# Patient Record
Sex: Female | Born: 1985 | Race: Black or African American | Hispanic: No | Marital: Single | Smoking: Never smoker
Health system: Southern US, Community
[De-identification: ages and names within clinical notes are randomized; demographics above are authoritative.]

## PROBLEM LIST (undated history)

## (undated) ENCOUNTER — Inpatient Hospital Stay (HOSPITAL_COMMUNITY): Payer: Self-pay

## (undated) DIAGNOSIS — Z202 Contact with and (suspected) exposure to infections with a predominantly sexual mode of transmission: Secondary | ICD-10-CM

## (undated) DIAGNOSIS — J45909 Unspecified asthma, uncomplicated: Secondary | ICD-10-CM

## (undated) DIAGNOSIS — R51 Headache: Secondary | ICD-10-CM

## (undated) DIAGNOSIS — A549 Gonococcal infection, unspecified: Secondary | ICD-10-CM

## (undated) DIAGNOSIS — I10 Essential (primary) hypertension: Secondary | ICD-10-CM

## (undated) HISTORY — PX: DILATION AND CURETTAGE OF UTERUS: SHX78

## (undated) HISTORY — PX: INDUCED ABORTION: SHX677

## (undated) HISTORY — PX: EYE SURGERY: SHX253

---
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
## 2005-02-21 ENCOUNTER — Emergency Department (HOSPITAL_COMMUNITY): Admission: EM | Admit: 2005-02-21 | Discharge: 2005-02-21 | Payer: Self-pay | Admitting: Emergency Medicine

## 2005-10-31 ENCOUNTER — Emergency Department (HOSPITAL_COMMUNITY): Admission: EM | Admit: 2005-10-31 | Discharge: 2005-10-31 | Payer: Self-pay | Admitting: Emergency Medicine

## 2005-12-14 ENCOUNTER — Inpatient Hospital Stay (HOSPITAL_COMMUNITY): Admission: AD | Admit: 2005-12-14 | Discharge: 2005-12-14 | Payer: Self-pay | Admitting: Family Medicine

## 2006-07-12 ENCOUNTER — Inpatient Hospital Stay (HOSPITAL_COMMUNITY): Admission: AD | Admit: 2006-07-12 | Discharge: 2006-07-12 | Payer: Self-pay | Admitting: Gynecology

## 2006-10-20 ENCOUNTER — Emergency Department (HOSPITAL_COMMUNITY): Admission: EM | Admit: 2006-10-20 | Discharge: 2006-10-21 | Payer: Self-pay | Admitting: Emergency Medicine

## 2007-02-28 ENCOUNTER — Inpatient Hospital Stay (HOSPITAL_COMMUNITY): Admission: AD | Admit: 2007-02-28 | Discharge: 2007-02-28 | Payer: Self-pay | Admitting: Obstetrics & Gynecology

## 2007-04-10 ENCOUNTER — Ambulatory Visit (HOSPITAL_COMMUNITY): Admission: RE | Admit: 2007-04-10 | Discharge: 2007-04-10 | Payer: Self-pay | Admitting: Obstetrics & Gynecology

## 2007-05-30 ENCOUNTER — Inpatient Hospital Stay (HOSPITAL_COMMUNITY): Admission: AD | Admit: 2007-05-30 | Discharge: 2007-05-30 | Payer: Self-pay | Admitting: Obstetrics & Gynecology

## 2007-06-05 ENCOUNTER — Ambulatory Visit (HOSPITAL_COMMUNITY): Admission: RE | Admit: 2007-06-05 | Discharge: 2007-06-05 | Payer: Self-pay | Admitting: Obstetrics

## 2007-06-26 ENCOUNTER — Inpatient Hospital Stay (HOSPITAL_COMMUNITY): Admission: AD | Admit: 2007-06-26 | Discharge: 2007-06-26 | Payer: Self-pay | Admitting: Obstetrics & Gynecology

## 2007-07-14 ENCOUNTER — Inpatient Hospital Stay (HOSPITAL_COMMUNITY): Admission: AD | Admit: 2007-07-14 | Discharge: 2007-07-14 | Payer: Self-pay | Admitting: Obstetrics

## 2007-10-13 ENCOUNTER — Inpatient Hospital Stay (HOSPITAL_COMMUNITY): Admission: AD | Admit: 2007-10-13 | Discharge: 2007-10-13 | Payer: Self-pay | Admitting: Obstetrics

## 2007-10-25 ENCOUNTER — Inpatient Hospital Stay (HOSPITAL_COMMUNITY): Admission: AD | Admit: 2007-10-25 | Discharge: 2007-10-27 | Payer: Self-pay | Admitting: Obstetrics

## 2008-03-22 ENCOUNTER — Emergency Department (HOSPITAL_COMMUNITY): Admission: EM | Admit: 2008-03-22 | Discharge: 2008-03-23 | Payer: Self-pay | Admitting: Emergency Medicine

## 2008-10-19 ENCOUNTER — Emergency Department (HOSPITAL_COMMUNITY): Admission: EM | Admit: 2008-10-19 | Discharge: 2008-10-19 | Payer: Self-pay | Admitting: Emergency Medicine

## 2009-01-08 ENCOUNTER — Emergency Department (HOSPITAL_COMMUNITY): Admission: EM | Admit: 2009-01-08 | Discharge: 2009-01-08 | Payer: Self-pay | Admitting: Emergency Medicine

## 2009-01-13 ENCOUNTER — Inpatient Hospital Stay (HOSPITAL_COMMUNITY): Admission: AD | Admit: 2009-01-13 | Discharge: 2009-01-13 | Payer: Self-pay | Admitting: Obstetrics & Gynecology

## 2009-01-13 ENCOUNTER — Ambulatory Visit: Payer: Self-pay | Admitting: Advanced Practice Midwife

## 2009-01-16 ENCOUNTER — Inpatient Hospital Stay (HOSPITAL_COMMUNITY): Admission: AD | Admit: 2009-01-16 | Discharge: 2009-01-17 | Payer: Self-pay | Admitting: Obstetrics & Gynecology

## 2009-04-02 ENCOUNTER — Inpatient Hospital Stay (HOSPITAL_COMMUNITY): Admission: AD | Admit: 2009-04-02 | Discharge: 2009-04-02 | Payer: Self-pay | Admitting: Obstetrics

## 2009-04-08 ENCOUNTER — Emergency Department (HOSPITAL_COMMUNITY): Admission: EM | Admit: 2009-04-08 | Discharge: 2009-04-09 | Payer: Self-pay | Admitting: Emergency Medicine

## 2009-04-21 ENCOUNTER — Ambulatory Visit (HOSPITAL_COMMUNITY): Admission: RE | Admit: 2009-04-21 | Discharge: 2009-04-21 | Payer: Self-pay | Admitting: Obstetrics

## 2009-05-20 ENCOUNTER — Inpatient Hospital Stay (HOSPITAL_COMMUNITY): Admission: AD | Admit: 2009-05-20 | Discharge: 2009-05-20 | Payer: Self-pay | Admitting: Obstetrics & Gynecology

## 2009-07-12 ENCOUNTER — Inpatient Hospital Stay (HOSPITAL_COMMUNITY): Admission: AD | Admit: 2009-07-12 | Discharge: 2009-07-12 | Payer: Self-pay | Admitting: Obstetrics

## 2009-08-13 ENCOUNTER — Ambulatory Visit: Payer: Self-pay | Admitting: Physician Assistant

## 2009-08-13 ENCOUNTER — Inpatient Hospital Stay (HOSPITAL_COMMUNITY): Admission: AD | Admit: 2009-08-13 | Discharge: 2009-08-14 | Payer: Self-pay | Admitting: Obstetrics & Gynecology

## 2009-08-30 ENCOUNTER — Inpatient Hospital Stay (HOSPITAL_COMMUNITY): Admission: AD | Admit: 2009-08-30 | Discharge: 2009-08-31 | Payer: Self-pay | Admitting: Obstetrics & Gynecology

## 2009-08-31 ENCOUNTER — Inpatient Hospital Stay (HOSPITAL_COMMUNITY): Admission: AD | Admit: 2009-08-31 | Discharge: 2009-08-31 | Payer: Self-pay | Admitting: Obstetrics

## 2009-09-05 ENCOUNTER — Inpatient Hospital Stay (HOSPITAL_COMMUNITY): Admission: AD | Admit: 2009-09-05 | Discharge: 2009-09-07 | Payer: Self-pay | Admitting: Obstetrics & Gynecology

## 2010-12-21 ENCOUNTER — Emergency Department (HOSPITAL_COMMUNITY)
Admission: EM | Admit: 2010-12-21 | Discharge: 2010-12-21 | Disposition: A | Discharge status: Home / Self Care | Payer: No Typology Code available for payment source | Attending: Emergency Medicine | Admitting: Emergency Medicine

## 2010-12-21 ENCOUNTER — Emergency Department (HOSPITAL_COMMUNITY): Payer: No Typology Code available for payment source

## 2010-12-21 DIAGNOSIS — M545 Low back pain, unspecified: Secondary | ICD-10-CM | POA: Insufficient documentation

## 2010-12-21 DIAGNOSIS — Y929 Unspecified place or not applicable: Secondary | ICD-10-CM | POA: Insufficient documentation

## 2010-12-21 DIAGNOSIS — S139XXA Sprain of joints and ligaments of unspecified parts of neck, initial encounter: Secondary | ICD-10-CM | POA: Insufficient documentation

## 2010-12-21 DIAGNOSIS — M542 Cervicalgia: Secondary | ICD-10-CM | POA: Insufficient documentation

## 2010-12-21 DIAGNOSIS — R51 Headache: Secondary | ICD-10-CM | POA: Insufficient documentation

## 2010-12-21 DIAGNOSIS — M546 Pain in thoracic spine: Secondary | ICD-10-CM | POA: Insufficient documentation

## 2010-12-21 DIAGNOSIS — T148XXA Other injury of unspecified body region, initial encounter: Secondary | ICD-10-CM | POA: Insufficient documentation

## 2010-12-27 LAB — CBC
HCT: 34.8 % — ABNORMAL LOW (ref 36.0–46.0)
MCHC: 33.1 g/dL (ref 30.0–36.0)
Platelets: 238 10*3/uL (ref 150–400)
Platelets: 270 10*3/uL (ref 150–400)
RBC: 3.81 MIL/uL — ABNORMAL LOW (ref 3.87–5.11)
RDW: 16.9 % — ABNORMAL HIGH (ref 11.5–15.5)

## 2010-12-29 LAB — URINE MICROSCOPIC-ADD ON

## 2010-12-29 LAB — URINALYSIS, ROUTINE W REFLEX MICROSCOPIC
Glucose, UA: NEGATIVE mg/dL
Nitrite: NEGATIVE
Protein, ur: NEGATIVE mg/dL
Urobilinogen, UA: 0.2 mg/dL (ref 0.0–1.0)

## 2010-12-31 LAB — URINALYSIS, ROUTINE W REFLEX MICROSCOPIC
Bilirubin Urine: NEGATIVE
Glucose, UA: NEGATIVE mg/dL
Specific Gravity, Urine: >1.03 — ABNORMAL HIGH (ref 1.005–1.030)
pH: 5.5 (ref 5.0–8.0)

## 2010-12-31 LAB — URINE MICROSCOPIC-ADD ON

## 2011-01-01 LAB — URINALYSIS, ROUTINE W REFLEX MICROSCOPIC
Bilirubin Urine: NEGATIVE
Bilirubin Urine: NEGATIVE
Glucose, UA: NEGATIVE mg/dL
Hgb urine dipstick: NEGATIVE
Hgb urine dipstick: NEGATIVE
Ketones, ur: 15 mg/dL — AB
Ketones, ur: NEGATIVE mg/dL
Nitrite: NEGATIVE
Protein, ur: NEGATIVE mg/dL
Urobilinogen, UA: 0.2 mg/dL (ref 0.0–1.0)
Urobilinogen, UA: 0.2 mg/dL (ref 0.0–1.0)

## 2011-01-01 LAB — URINE MICROSCOPIC-ADD ON

## 2011-01-01 LAB — URINE CULTURE: Colony Count: 40000

## 2011-01-04 LAB — URINALYSIS, ROUTINE W REFLEX MICROSCOPIC
Glucose, UA: NEGATIVE mg/dL
Glucose, UA: NEGATIVE mg/dL
Hgb urine dipstick: NEGATIVE
Ketones, ur: NEGATIVE mg/dL
Nitrite: NEGATIVE
Protein, ur: NEGATIVE mg/dL
Protein, ur: NEGATIVE mg/dL
Specific Gravity, Urine: 1.025 (ref 1.005–1.030)
Urobilinogen, UA: 0.2 mg/dL (ref 0.0–1.0)

## 2011-01-04 LAB — URINE MICROSCOPIC-ADD ON

## 2011-01-04 LAB — WET PREP, GENITAL
Trich, Wet Prep: NONE SEEN
Yeast Wet Prep HPF POC: NONE SEEN

## 2011-01-04 LAB — GC/CHLAMYDIA PROBE AMP, GENITAL
Chlamydia, DNA Probe: NEGATIVE
GC Probe Amp, Genital: NEGATIVE

## 2011-01-09 LAB — URINALYSIS, ROUTINE W REFLEX MICROSCOPIC
Nitrite: POSITIVE — AB
Protein, ur: 100 mg/dL — AB
Specific Gravity, Urine: 1.023 (ref 1.005–1.030)
Urobilinogen, UA: 1 mg/dL (ref 0.0–1.0)

## 2011-01-09 LAB — URINE CULTURE

## 2011-01-09 LAB — URINE MICROSCOPIC-ADD ON

## 2011-06-15 LAB — URINALYSIS, ROUTINE W REFLEX MICROSCOPIC
Glucose, UA: NEGATIVE
Hgb urine dipstick: NEGATIVE
Ketones, ur: NEGATIVE
pH: 5.5

## 2011-06-15 LAB — CBC
HCT: 35 — ABNORMAL LOW
Hemoglobin: 10.7 — ABNORMAL LOW
Hemoglobin: 11.9 — ABNORMAL LOW
MCHC: 33.5
MCV: 84.2
MCV: 84.8
RBC: 3.75 — ABNORMAL LOW
RDW: 14.4
RDW: 14.9

## 2011-06-22 LAB — URINE MICROSCOPIC-ADD ON

## 2011-06-22 LAB — POCT PREGNANCY, URINE
Operator id: 277751
Preg Test, Ur: NEGATIVE

## 2011-06-22 LAB — URINALYSIS, ROUTINE W REFLEX MICROSCOPIC
Glucose, UA: NEGATIVE
Ketones, ur: NEGATIVE
Protein, ur: NEGATIVE

## 2011-07-06 LAB — URINALYSIS, ROUTINE W REFLEX MICROSCOPIC
Ketones, ur: NEGATIVE
Nitrite: NEGATIVE
Protein, ur: NEGATIVE
Urobilinogen, UA: 0.2

## 2011-07-07 LAB — URINALYSIS, ROUTINE W REFLEX MICROSCOPIC
Bilirubin Urine: NEGATIVE
Glucose, UA: NEGATIVE
Ketones, ur: NEGATIVE
pH: 5.5

## 2011-07-07 LAB — WET PREP, GENITAL
Clue Cells Wet Prep HPF POC: NONE SEEN
Trich, Wet Prep: NONE SEEN

## 2011-07-07 LAB — URINE MICROSCOPIC-ADD ON: RBC / HPF: NONE SEEN

## 2011-07-13 LAB — GC/CHLAMYDIA PROBE AMP, GENITAL: Chlamydia, DNA Probe: NEGATIVE

## 2011-07-13 LAB — CBC
HCT: 38.3
MCV: 88
Platelets: 284
RDW: 14

## 2011-07-13 LAB — URINALYSIS, ROUTINE W REFLEX MICROSCOPIC
Glucose, UA: NEGATIVE
Ketones, ur: NEGATIVE
Protein, ur: NEGATIVE
Urobilinogen, UA: 0.2

## 2011-07-13 LAB — WET PREP, GENITAL: Trich, Wet Prep: NONE SEEN

## 2011-07-13 LAB — POCT PREGNANCY, URINE: Preg Test, Ur: POSITIVE

## 2011-07-29 ENCOUNTER — Emergency Department (HOSPITAL_COMMUNITY): Payer: No Typology Code available for payment source

## 2011-07-29 ENCOUNTER — Emergency Department (HOSPITAL_COMMUNITY)
Admission: EM | Admit: 2011-07-29 | Discharge: 2011-07-29 | Disposition: A | Discharge status: Home / Self Care | Payer: No Typology Code available for payment source | Attending: Emergency Medicine | Admitting: Emergency Medicine

## 2011-07-29 DIAGNOSIS — M542 Cervicalgia: Secondary | ICD-10-CM | POA: Insufficient documentation

## 2011-07-29 DIAGNOSIS — S139XXA Sprain of joints and ligaments of unspecified parts of neck, initial encounter: Secondary | ICD-10-CM | POA: Insufficient documentation

## 2011-07-29 DIAGNOSIS — M545 Low back pain, unspecified: Secondary | ICD-10-CM | POA: Insufficient documentation

## 2011-07-29 DIAGNOSIS — S99929A Unspecified injury of unspecified foot, initial encounter: Secondary | ICD-10-CM | POA: Insufficient documentation

## 2011-07-29 DIAGNOSIS — S335XXA Sprain of ligaments of lumbar spine, initial encounter: Secondary | ICD-10-CM | POA: Insufficient documentation

## 2011-07-29 DIAGNOSIS — M25569 Pain in unspecified knee: Secondary | ICD-10-CM | POA: Insufficient documentation

## 2011-07-29 DIAGNOSIS — S8990XA Unspecified injury of unspecified lower leg, initial encounter: Secondary | ICD-10-CM | POA: Insufficient documentation

## 2011-08-31 ENCOUNTER — Inpatient Hospital Stay (HOSPITAL_COMMUNITY)
Admission: AD | Admit: 2011-08-31 | Discharge: 2011-08-31 | Disposition: A | Discharge status: Home / Self Care | Payer: Self-pay | Source: Ambulatory Visit | Attending: Obstetrics & Gynecology | Admitting: Obstetrics & Gynecology

## 2011-08-31 ENCOUNTER — Encounter (HOSPITAL_COMMUNITY): Payer: Self-pay | Admitting: *Deleted

## 2011-08-31 DIAGNOSIS — R109 Unspecified abdominal pain: Secondary | ICD-10-CM | POA: Insufficient documentation

## 2011-08-31 HISTORY — DX: Gonococcal infection, unspecified: A54.9

## 2011-08-31 HISTORY — DX: Essential (primary) hypertension: I10

## 2011-08-31 LAB — URINE MICROSCOPIC-ADD ON: WBC, UA: NONE SEEN WBC/hpf

## 2011-08-31 LAB — URINALYSIS, ROUTINE W REFLEX MICROSCOPIC
Bilirubin Urine: NEGATIVE
Glucose, UA: NEGATIVE mg/dL
Ketones, ur: NEGATIVE mg/dL
Leukocytes, UA: NEGATIVE
Nitrite: NEGATIVE
Protein, ur: NEGATIVE mg/dL
Specific Gravity, Urine: 1.025 (ref 1.005–1.030)
Urobilinogen, UA: 0.2 mg/dL (ref 0.0–1.0)
pH: 6 (ref 5.0–8.0)

## 2011-08-31 LAB — WET PREP, GENITAL
Clue Cells Wet Prep HPF POC: NONE SEEN
Trich, Wet Prep: NONE SEEN
WBC, Wet Prep HPF POC: NONE SEEN
Yeast Wet Prep HPF POC: NONE SEEN

## 2011-08-31 LAB — POCT PREGNANCY, URINE: Preg Test, Ur: NEGATIVE

## 2011-08-31 MED ORDER — NAPROXEN SODIUM 550 MG PO TABS: 550.0000 mg | ORAL_TABLET | Freq: Two times a day (BID) | ORAL | Status: DC

## 2011-08-31 NOTE — ED Provider Notes (Signed)
History   Pt presents today c/o lower abd cramping and vag dc for the past week. She states she thought she had a yeast infection and tried some OTC meds but had no relief. She denies fever, dysuria, or any other sx at this time. She uses Implanon for birth control.  Chief Complaint  Patient presents with  . Abdominal Pain   HPI  OB History    Grav Para Term Preterm Abortions TAB SAB Ect Mult Living   3 2 2  1 1    2       Past Medical History  Diagnosis Date  . Hypertension   . Gonorrhea     Past Surgical History  Procedure Date  . No past surgeries     No family history on file.  History  Substance Use Topics  . Smoking status: Never Smoker   . Smokeless tobacco: Not on file  . Alcohol Use: Yes    Allergies: No Known Allergies  Prescriptions prior to admission  Medication Sig Dispense Refill  . aspirin 325 MG tablet Take 650 mg by mouth every 6 (six) hours as needed. Takes for headaches       . metroNIDAZOLE (METROGEL) 0.75 % vaginal gel Place 1 Applicatorful vaginally daily.          Review of Systems  Constitutional: Negative for fever.  Cardiovascular: Negative for chest pain.  Gastrointestinal: Positive for abdominal pain. Negative for nausea, vomiting, diarrhea and constipation.  Genitourinary: Negative for dysuria, urgency, frequency and hematuria.  Neurological: Negative for dizziness and headaches.  Psychiatric/Behavioral: Negative for depression and suicidal ideas.   Physical Exam   Blood pressure 127/77, pulse 72, temperature 98.9 F (37.2 C), temperature source Oral, resp. rate 20, height 5\' 6"  (1.676 m), weight 208 lb 6.4 oz (94.53 kg), SpO2 98.00%.  Physical Exam  Nursing note and vitals reviewed. Constitutional: She is oriented to person, place, and time. She appears well-developed and well-nourished. No distress.  HENT:  Head: Normocephalic and atraumatic.  GI: Soft. She exhibits no distension. There is tenderness (Tender over bladder.).  There is no rebound and no guarding.  Genitourinary: Uterus normal. Guaiac negative stool. No bleeding around the vagina. Vaginal discharge found.       Thin, white vag dc present in vault.  Neurological: She is alert and oriented to person, place, and time.  Skin: Skin is warm and dry. She is not diaphoretic.  Psychiatric: She has a normal mood and affect. Her behavior is normal. Judgment and thought content normal.    MAU Course  Procedures  Wet prep and GC/Chlamydia cultures done.  Results for orders placed during the hospital encounter of 08/31/11 (from the past 24 hour(s))  URINALYSIS, ROUTINE W REFLEX MICROSCOPIC     Status: Abnormal   Collection Time   08/31/11 11:46 AM      Component Value Range   Color, Urine YELLOW  YELLOW    APPearance CLEAR  CLEAR    Specific Gravity, Urine 1.025  1.005 - 1.030    pH 6.0  5.0 - 8.0    Glucose, UA NEGATIVE  NEGATIVE (mg/dL)   Hgb urine dipstick SMALL (*) NEGATIVE    Bilirubin Urine NEGATIVE  NEGATIVE    Ketones, ur NEGATIVE  NEGATIVE (mg/dL)   Protein, ur NEGATIVE  NEGATIVE (mg/dL)   Urobilinogen, UA 0.2  0.0 - 1.0 (mg/dL)   Nitrite NEGATIVE  NEGATIVE    Leukocytes, UA NEGATIVE  NEGATIVE   URINE MICROSCOPIC-ADD ON  Status: Abnormal   Collection Time   08/31/11 11:46 AM      Component Value Range   Squamous Epithelial / LPF FEW (*) RARE    WBC, UA    <3 (WBC/hpf)   Value: NO FORMED ELEMENTS SEEN ON URINE MICROSCOPIC EXAMINATION   RBC / HPF 3-6  <3 (RBC/hpf)   Bacteria, UA FEW (*) RARE    Urine-Other MUCOUS PRESENT    POCT PREGNANCY, URINE     Status: Normal   Collection Time   08/31/11 11:53 AM      Component Value Range   Preg Test, Ur NEGATIVE    WET PREP, GENITAL     Status: Normal   Collection Time   08/31/11 11:56 AM      Component Value Range   Yeast, Wet Prep NONE SEEN  NONE SEEN    Trich, Wet Prep NONE SEEN  NONE SEEN    Clue Cells, Wet Prep NONE SEEN  NONE SEEN    WBC, Wet Prep HPF POC NONE SEEN  NONE SEEN       Assessment and Plan  Abd pain: discussed with pt at length. Will give Rx for anaprox ds. She will f/u with Dr. Clearance Coots. Discussed diet, activity, risks, and precautions.  Clinton Gallant. Valerye Kobus III, DrHSc, MPAS, PA-C  08/31/2011, 12:03 PM   Henrietta Hoover, PA 08/31/11 1225

## 2011-08-31 NOTE — Progress Notes (Signed)
Patient states she has had an Implanon since last year. Started having lower abdominal cramping about one week ago. Thought she had a yeast infection and used OTC medication and noticed some spotting.

## 2011-09-01 LAB — GC/CHLAMYDIA PROBE AMP, GENITAL: GC Probe Amp, Genital: NEGATIVE

## 2012-02-08 ENCOUNTER — Inpatient Hospital Stay (HOSPITAL_COMMUNITY)
Admission: AD | Admit: 2012-02-08 | Discharge: 2012-02-08 | Disposition: A | Discharge status: Home / Self Care | Payer: Self-pay | Source: Ambulatory Visit | Attending: Obstetrics | Admitting: Obstetrics

## 2012-02-08 ENCOUNTER — Encounter (HOSPITAL_COMMUNITY): Payer: Self-pay | Admitting: *Deleted

## 2012-02-08 DIAGNOSIS — N938 Other specified abnormal uterine and vaginal bleeding: Secondary | ICD-10-CM | POA: Insufficient documentation

## 2012-02-08 DIAGNOSIS — A499 Bacterial infection, unspecified: Secondary | ICD-10-CM | POA: Insufficient documentation

## 2012-02-08 DIAGNOSIS — N949 Unspecified condition associated with female genital organs and menstrual cycle: Secondary | ICD-10-CM | POA: Insufficient documentation

## 2012-02-08 DIAGNOSIS — N72 Inflammatory disease of cervix uteri: Secondary | ICD-10-CM | POA: Insufficient documentation

## 2012-02-08 DIAGNOSIS — N760 Acute vaginitis: Secondary | ICD-10-CM | POA: Insufficient documentation

## 2012-02-08 DIAGNOSIS — B9689 Other specified bacterial agents as the cause of diseases classified elsewhere: Secondary | ICD-10-CM | POA: Insufficient documentation

## 2012-02-08 LAB — WET PREP, GENITAL
Trich, Wet Prep: NONE SEEN
Yeast Wet Prep HPF POC: NONE SEEN

## 2012-02-08 LAB — URINALYSIS, ROUTINE W REFLEX MICROSCOPIC
Bilirubin Urine: NEGATIVE
Glucose, UA: NEGATIVE mg/dL
Ketones, ur: NEGATIVE mg/dL
Leukocytes, UA: NEGATIVE
pH: 8 (ref 5.0–8.0)

## 2012-02-08 LAB — URINE MICROSCOPIC-ADD ON

## 2012-02-08 MED ORDER — AZITHROMYCIN 250 MG PO TABS
1000.0000 mg | ORAL_TABLET | Freq: Once | ORAL | Status: AC
  Administered 2012-02-08: 1000 mg via ORAL
  Filled 2012-02-08: qty 4

## 2012-02-08 MED ORDER — CEFTRIAXONE SODIUM 250 MG IJ SOLR
250.0000 mg | Freq: Once | INTRAMUSCULAR | Status: AC
  Administered 2012-02-08: 250 mg via INTRAMUSCULAR
  Filled 2012-02-08: qty 250

## 2012-02-08 MED ORDER — METRONIDAZOLE 500 MG PO TABS: 500.0000 mg | ORAL_TABLET | Freq: Two times a day (BID) | ORAL | Status: AC

## 2012-02-08 NOTE — MAU Note (Signed)
AFTER  SEX- SHOWERED  THEN DID NOT SEE ANY MORE BLEEDING EXCEPT  ON PAPER AFTER VOIDING.  NO VAG BLEEDING NOW.

## 2012-02-08 NOTE — MAU Note (Signed)
PT HAS IMPLANON IN FOR BIRTH CONTROL IN 2011- BY DR HARPER.  HAS A CYCLE - BUT HAS SPOTTING ALSO.  BUT TODAY WHEN SHE HAD SEX AT 10AM- STARTED BLEEDING    AND PAINFUL.-  SAME PARTNER.  PT CAME TO HOSPITAL THIS AM BUT SAYS IT WAS BUSY SO SHE LEFT.    DID NOT CALL DR Rubye Beach [facility]

## 2012-02-08 NOTE — MAU Provider Note (Signed)
History     CSN: [id number]  Arrival date and time: 02/08/12 7253   First Provider Initiated Contact with Patient 02/08/12 2009      Pt is not pregnant with Implanon in place for 2 years and has unscheduled bleeding. Pt had intercourse this morning and had vaginal bleeding andl discomfort. She used a condom with condom breakage.  She has irritation like when she has a yeast infection.  She denies vaginal discharge, UTI symptoms, constipation or diarrhea. Pt has a history of gonorrhea. The history is provided by the patient.      Past Medical History  Diagnosis Date  . Gonorrhea   . Hypertension     no meds    Past Surgical History  Procedure Date  . No past surgeries     Family History  Problem Relation Age of Onset  . Anesthesia problems Neg Hx     History  Substance Use Topics  . Smoking status: Never Smoker   . Smokeless tobacco: Not on file  . Alcohol Use: Yes    Allergies: No Known Allergies  Prescriptions prior to admission  Medication Sig Dispense Refill  . aspirin 325 MG tablet Take 650 mg by mouth every 6 (six) hours as needed. Takes for headaches       . metroNIDAZOLE (METROGEL) 0.75 % vaginal gel Place 1 Applicatorful vaginally daily.        . naproxen sodium (ANAPROX DS) 550 MG tablet Take 1 tablet (550 mg total) by mouth 2 (two) times daily with a meal.  30 tablet  0    Review of Systems  Constitutional: Negative for fever and chills.  Gastrointestinal: Negative for nausea, vomiting, diarrhea and constipation.  Genitourinary: Negative for dysuria, urgency and frequency.   Physical Exam   Blood pressure 121/75, pulse 86, temperature 98.3 F (36.8 C), temperature source Oral, resp. rate 20, height 5\' 6"  (1.676 m), weight 208 lb 6 oz (94.518 kg), last menstrual period 01/25/2012.  Physical Exam  Nursing note and vitals reviewed. Constitutional: She is oriented to person, place, and time. She appears well-developed and well-nourished. No distress.   HENT:  Head: Normocephalic.  Eyes: Pupils are equal, round, and reactive to light.  Neck: Normal range of motion. Neck supple.  Cardiovascular: Normal rate.   Respiratory: Effort normal.  GI: Soft. She exhibits no distension. There is no tenderness. There is no rebound and no guarding.  Genitourinary:       Small amount of dark brown blood in vault; cervix clean, mildly tender; with mild adnexal tenderness; no palpable enlargement  Musculoskeletal: Normal range of motion.  Neurological: She is alert and oriented to person, place, and time.  Skin: Skin is warm and dry.  Psychiatric: She has a normal mood and affect.   Results for orders placed during the hospital encounter of 02/08/12 (from the past 24 hour(s))  POCT PREGNANCY, URINE     Status: Normal   Collection Time   02/08/12  8:01 PM      Component Value Range   Preg Test, Ur NEGATIVE  NEGATIVE   WET PREP, GENITAL     Status: Abnormal   Collection Time   02/08/12  8:18 PM      Component Value Range   Yeast Wet Prep HPF POC NONE SEEN  NONE SEEN    Trich, Wet Prep NONE SEEN  NONE SEEN    Clue Cells Wet Prep HPF POC MANY (*) NONE SEEN    WBC, Wet Prep HPF  POC MODERATE (*) NONE SEEN     MAU Course  Procedures  Pt treated with Zithromax and  Rocephin in MAU for cervicitis GC and Chlamydia treated  Assessment and Plan  BV-Flagyl 500 mg BID for 7 days Cervicitis- treated in MAU Bryce Cheever 02/08/2012, 8:22 PM

## 2012-03-21 ENCOUNTER — Inpatient Hospital Stay (HOSPITAL_COMMUNITY)
Admission: AD | Admit: 2012-03-21 | Discharge: 2012-03-22 | Disposition: A | Discharge status: Home / Self Care | Payer: Self-pay | Source: Ambulatory Visit | Attending: Family Medicine | Admitting: Family Medicine

## 2012-03-21 ENCOUNTER — Encounter (HOSPITAL_COMMUNITY): Payer: Self-pay | Admitting: *Deleted

## 2012-03-21 DIAGNOSIS — N730 Acute parametritis and pelvic cellulitis: Secondary | ICD-10-CM | POA: Insufficient documentation

## 2012-03-21 DIAGNOSIS — R109 Unspecified abdominal pain: Secondary | ICD-10-CM | POA: Insufficient documentation

## 2012-03-21 DIAGNOSIS — N949 Unspecified condition associated with female genital organs and menstrual cycle: Secondary | ICD-10-CM | POA: Insufficient documentation

## 2012-03-21 HISTORY — DX: Headache: R51

## 2012-03-21 HISTORY — DX: Contact with and (suspected) exposure to infections with a predominantly sexual mode of transmission: Z20.2

## 2012-03-21 LAB — POCT PREGNANCY, URINE: Preg Test, Ur: NEGATIVE

## 2012-03-21 LAB — URINALYSIS, ROUTINE W REFLEX MICROSCOPIC
Glucose, UA: NEGATIVE mg/dL
Specific Gravity, Urine: 1.025 (ref 1.005–1.030)
Urobilinogen, UA: 0.2 mg/dL (ref 0.0–1.0)
pH: 6 (ref 5.0–8.0)

## 2012-03-21 LAB — URINE MICROSCOPIC-ADD ON

## 2012-03-21 LAB — WET PREP, GENITAL

## 2012-03-21 MED ORDER — CEFTRIAXONE SODIUM 250 MG IJ SOLR
250.0000 mg | Freq: Once | INTRAMUSCULAR | Status: AC
  Administered 2012-03-21: 250 mg via INTRAMUSCULAR
  Filled 2012-03-21: qty 250

## 2012-03-21 MED ORDER — KETOROLAC TROMETHAMINE 10 MG PO TABS
10.0000 mg | ORAL_TABLET | Freq: Once | ORAL | Status: AC
  Administered 2012-03-21: 10 mg via ORAL
  Filled 2012-03-21: qty 1

## 2012-03-21 MED ORDER — DOXYCYCLINE HYCLATE 100 MG PO TABS
100.0000 mg | ORAL_TABLET | Freq: Two times a day (BID) | ORAL | Status: DC
  Administered 2012-03-21: 100 mg via ORAL
  Filled 2012-03-21: qty 1

## 2012-03-21 NOTE — MAU Note (Signed)
Pt reports a thin discharge today.  Hx BAV and chlamydia in May.  Lower abd pain since May.  Unsure of LMP.  Implanon  For birth control.

## 2012-03-21 NOTE — MAU Provider Note (Signed)
Corky Mull [id number]. Chief Complaint  Patient presents with  . Vaginal Discharge  . Abdominal Pain   First Provider Initiated Contact with Patient 03/21/12 2238    SUBJECTIVE  HPI: HPI: Erica Mack is a 26 y.o. year old G62P2012 female who presents to MAU reporting vaginal discharge and low abd cramping w/ gradual onset. She has irreg, light periods due to Nexplanon. She was seen in MAU 02/08/12 and Dx w/ Chlamydia. She is unsure if her partner was Tx, but had IC with him since then. She denies fever, chills, VB, urinary Sx. GI Sx.   Past Medical History  Diagnosis Date  . Gonorrhea   . Hypertension     no meds  . Chlamydia contact, treated   . Headache    Past Surgical History  Procedure Date  . No past surgeries    History   Social History  . Marital Status: Single    Spouse Name: N/A    Number of Children: N/A  . Years of Education: N/A   Occupational History  . Not on file.   Social History Main Topics  . Smoking status: Never Smoker   . Smokeless tobacco: Not on file  . Alcohol Use: Yes  . Drug Use: No  . Sexually Active: Yes    Birth Control/ Protection: Implant   Other Topics Concern  . Not on file   Social History Narrative  . No narrative on file   No current facility-administered medications on file prior to encounter.   Current Outpatient Prescriptions on File Prior to Encounter  Medication Sig Dispense Refill  . aspirin 325 MG tablet Take 650 mg by mouth every 6 (six) hours as needed. Takes for headaches      . ibuprofen (ADVIL,MOTRIN) 200 MG tablet Take 200 mg by mouth every 6 (six) hours as needed. Head ache      . naproxen sodium (ANAPROX DS) 550 MG tablet Take 1 tablet (550 mg total) by mouth 2 (two) times daily with a meal.  30 tablet  0   No Known Allergies  ROS: Pertinent items in HPI  OBJECTIVE Blood pressure 117/65, pulse 90, temperature 98.8 F (37.1 C), temperature source Oral, resp. rate 16, height 5\' 6"  (1.676 m),  weight 94.892 kg (209 lb 3.2 oz).  GENERAL: Well-developed, well-nourished female in mild distress.  HEENT: Normocephalic, good dentition HEART: normal rate RESP: normal effort ABDOMEN: Soft, mild low abdominal tender, no rebound, guarding or mass. Positive bowel sounds x4 EXTREMITIES: Nontender, no edema NEURO: Alert and oriented SPECULUM EXAM: NEFG, small amount of thin, white, malodorous discharge, no blood noted, cervix clean BIMANUAL: cervix closed; uterus NSSP; mild right adnexal tenderness, no masses. Positive CMT.   LAB RESULTS  Results for orders placed during the hospital encounter of 03/21/12 (from the past 24 hour(s))  URINALYSIS, ROUTINE W REFLEX MICROSCOPIC     Status: Abnormal   Collection Time   03/21/12  8:25 PM      Component Value Range   Color, Urine YELLOW  YELLOW   APPearance CLEAR  CLEAR   Specific Gravity, Urine 1.025  1.005 - 1.030   pH 6.0  5.0 - 8.0   Glucose, UA NEGATIVE  NEGATIVE mg/dL   Hgb urine dipstick TRACE (*) NEGATIVE   Bilirubin Urine NEGATIVE  NEGATIVE   Ketones, ur NEGATIVE  NEGATIVE mg/dL   Protein, ur NEGATIVE  NEGATIVE mg/dL   Urobilinogen, UA 0.2  0.0 - 1.0 mg/dL   Nitrite NEGATIVE  NEGATIVE   Leukocytes, UA NEGATIVE  NEGATIVE  URINE MICROSCOPIC-ADD ON     Status: Abnormal   Collection Time   03/21/12  8:25 PM      Component Value Range   Squamous Epithelial / LPF FEW (*) RARE   WBC, UA 0-2  <3 WBC/hpf   Bacteria, UA FEW (*) RARE   Urine-Other MUCOUS PRESENT    POCT PREGNANCY, URINE     Status: Normal   Collection Time   03/21/12  9:33 PM      Component Value Range   Preg Test, Ur NEGATIVE  NEGATIVE  WET PREP, GENITAL     Status: Abnormal   Collection Time   03/21/12 10:40 PM      Component Value Range   Yeast Wet Prep HPF POC NONE SEEN  NONE SEEN   Trich, Wet Prep NONE SEEN  NONE SEEN   Clue Cells Wet Prep HPF POC FEW (*) NONE SEEN   WBC, Wet Prep HPF POC FEW (*) NONE SEEN   IMAGING N/A  ED course Rocephin 250 mg IM,  doxycycline 100 mg by mouth and Toradol given.  ASSESSMENT  1.  Presumed Mild Acute PID (pelvic inflammatory disease)    PLAN D/C home Strongly encouraged condom use No intercourse until one week after treatment and one week after partner treatment Lengthy discussion of risks of pelvic inflammatory disease including increased risk of ectopic pregnancy and infertility Medication List  As of 03/22/2012  3:58 AM   STOP taking these medications         ibuprofen 200 MG tablet      naproxen sodium 550 MG tablet         TAKE these medications         aspirin 325 MG tablet   Take 650 mg by mouth every 6 (six) hours as needed. Takes for headaches      doxycycline 100 MG tablet   Commonly known as: VIBRA-TABS   Take 1 tablet (100 mg total) by mouth every 12 (twelve) hours.      ketorolac 10 MG tablet   Commonly known as: TORADOL   Take 1 tablet (10 mg total) by mouth every 6 (six) hours as needed for pain.           Follow-up Information    Follow up with [facility]. (As needed if symptoms worsen)    Contact information:   [address] [place] [address] [phone]        Dorathy Kinsman 03/21/2012 10:48 PM

## 2012-03-21 NOTE — MAU Note (Signed)
Pt c/o low abd cramping for the past two weeks, and a new, clear, slimy, discharge for the past three days,; denies any bleeding or s/s of UTI.

## 2012-03-22 DIAGNOSIS — N730 Acute parametritis and pelvic cellulitis: Secondary | ICD-10-CM

## 2012-03-22 MED ORDER — DOXYCYCLINE HYCLATE 100 MG PO TABS: 100.0000 mg | ORAL_TABLET | Freq: Two times a day (BID) | ORAL | Status: AC

## 2012-03-22 MED ORDER — KETOROLAC TROMETHAMINE 10 MG PO TABS: 10.0000 mg | ORAL_TABLET | Freq: Four times a day (QID) | ORAL | Status: AC | PRN

## 2012-03-22 NOTE — Discharge Instructions (Signed)
Pelvic Inflammatory Disease  Pelvic Inflammatory Disease (PID) is an infection in some or all of your female organs. This includes the womb (uterus), ovaries, fallopian tubes and tissues in the pelvis. PID is a common cause of sudden onset (acute) lower abdominal (pelvic) pain. PID can be treated, but it is a serious infection. It may take weeks before you are completely well. In some cases, hospitalization is needed for surgery or to administer medications to kill germs (antibiotics) through your veins (intravenously).  CAUSES    It may be caused by germs that are spread during sexual contact.   PID can also occur following:   The birth of a baby.   A miscarriage.   An abortion.   Major surgery of the pelvis.   Use of an IUD.   Sexual assault.  SYMPTOMS    Abdominal or pelvic pain.   Fever.   Chills.   Abnormal vaginal discharge.  DIAGNOSIS   Your caregiver will choose some of these methods to make a diagnosis:   A physical exam and history.   Blood tests.   Cultures of the vagina and cervix.   X-rays or ultrasound.   A procedure to look inside the pelvis (laparoscopy).  TREATMENT    Use of antibiotics by mouth or intravenously.   Treatment of sexual partners when the infection is an sexually transmitted disease (STD).   Hospitalization and surgery may be needed.  RISKS AND COMPLICATIONS    PID can cause women to become unable to have children (sterile) if left untreated or if partially treated. That is why it is important to finish all medications given to you.   Sterility or future tubal (ectopic) pregnancies can occur in fully treated individuals. This is why it is so important to follow your prescribed treatment.   It can cause longstanding (chronic) pelvic pain after frequent infections.   Painful intercourse.   Pelvic abscesses.   In rare cases, surgery or a hysterectomy may be needed.   If this is a sexually transmitted infection (STI), you are also at risk for any other STD  including AIDSor human papillomavirus (HPV).  HOME CARE INSTRUCTIONS    Finish all medication as prescribed. Incomplete treatment will put you at risk for sterility and tubal pregnancy.   Only take over-the-counter or prescription medicines for pain, discomfort, or fever as directed by your caregiver.   Do not have sex until treatment is completed or as directed by your caregiver. If PID is confirmed, your recent sexual contacts will need treatment.   Keep your follow-up appointments.  SEEK MEDICAL CARE IF:    You have increased or abnormal vaginal discharge.   You need prescription medication for your pain.   Your partner has an STD.   You are vomiting.   You cannot take your medications.  SEEK IMMEDIATE MEDICAL CARE IF:    You have a fever.   You develop increased abdominal or pelvic pain.   You develop chills.   You have pain when you urinate.   You are not better after 72 hours following treatment.  Document Released: 09/11/2005 Document Revised: 08/31/2011 Document Reviewed: 05/25/2007  [employer] Patient Information 2012 [employer].

## 2012-03-22 NOTE — MAU Provider Note (Signed)
Chart reviewed and agree with management and plan.  

## 2012-08-23 ENCOUNTER — Encounter (HOSPITAL_COMMUNITY): Payer: Self-pay | Admitting: *Deleted

## 2012-08-23 ENCOUNTER — Inpatient Hospital Stay (HOSPITAL_COMMUNITY)
Admission: AD | Admit: 2012-08-23 | Discharge: 2012-08-23 | Disposition: A | Discharge status: Home / Self Care | Payer: Self-pay | Source: Ambulatory Visit | Attending: Obstetrics | Admitting: Obstetrics

## 2012-08-23 DIAGNOSIS — R21 Rash and other nonspecific skin eruption: Secondary | ICD-10-CM | POA: Insufficient documentation

## 2012-08-23 DIAGNOSIS — Z113 Encounter for screening for infections with a predominantly sexual mode of transmission: Secondary | ICD-10-CM | POA: Insufficient documentation

## 2012-08-23 DIAGNOSIS — N949 Unspecified condition associated with female genital organs and menstrual cycle: Secondary | ICD-10-CM | POA: Insufficient documentation

## 2012-08-23 LAB — POCT PREGNANCY, URINE: Preg Test, Ur: NEGATIVE

## 2012-08-23 LAB — WET PREP, GENITAL: Trich, Wet Prep: NONE SEEN

## 2012-08-23 NOTE — MAU Note (Signed)
Also have rash on wrists and hands. Itches. Has been there awhile.

## 2012-08-23 NOTE — MAU Note (Signed)
I've had vaginal d/c that I noticed on Weds. No itching. White d/c with fishy odor.

## 2012-08-23 NOTE — MAU Provider Note (Signed)
History     CSN: [id number]  Arrival date and time: 08/23/12 2040   First Provider Initiated Contact with Patient 08/23/12 2130        Chief Complaint  Patient presents with  . Vaginal Discharge  . Rash   Vaginal Discharge The patient's primary symptoms include a vaginal discharge. Associated symptoms include rash. Pertinent negatives include no abdominal pain, constipation, diarrhea, fever, nausea or vomiting.  Rash Pertinent negatives include no diarrhea, fever or vomiting.  Erica Mack 26 y.o. Comes to MAU with Vaginal discharge with odor since Wednesday.  Has Implanon and had bleeding for 2 weeks which is normal for her.  Just started using tampons and used tampons with this last menses.  Also has a rash on her left wrist which itches and she has had for awhile.    OB History    Grav Para Term Preterm Abortions TAB SAB Ect Mult Living   3 2 2  1 1    2       Past Medical History  Diagnosis Date  . Gonorrhea   . Hypertension     no meds  . Chlamydia contact, treated   . Headache     Past Surgical History  Procedure Date  . No past surgeries     Family History  Problem Relation Age of Onset  . Anesthesia problems Neg Hx   . Hypertension Mother   . Diabetes Brother   . Hypertension Maternal Aunt   . Cancer Maternal Grandmother   . Hypertension Maternal Grandmother     History  Substance Use Topics  . Smoking status: Light Tobacco Smoker  . Smokeless tobacco: Not on file  . Alcohol Use: 3.3 oz/week    3 Shots of liquor, 3 Drinks containing 0.5 oz of alcohol per week    Allergies: No Known Allergies  Prescriptions prior to admission  Medication Sig Dispense Refill  . aspirin 325 MG tablet Take 650 mg by mouth every 6 (six) hours as needed. Takes for headaches        Review of Systems  Constitutional: Negative for fever.  Gastrointestinal: Negative for nausea, vomiting, abdominal pain, diarrhea and constipation.  Genitourinary: Positive for  vaginal discharge.       Vaginal discharge. No vaginal bleeding. No dysuria.  Skin: Positive for rash.   Physical Exam   Blood pressure 127/74, pulse 96, temperature 98.1 F (36.7 C), temperature source Oral, resp. rate 20, height 5\' 6"  (1.676 m), weight 91.808 kg (202 lb 6.4 oz), last menstrual period 08/01/2012.  Physical Exam  Nursing note and vitals reviewed. Constitutional: She is oriented to person, place, and time. She appears well-developed and well-nourished.  HENT:  Head: Normocephalic.  Eyes: EOM are normal.  Neck: Neck supple.  GI: Soft. There is no tenderness.  Genitourinary:       Speculum exam: Vulva - negative  Vagina - minimal creamy discharge, no odor Cervix - No contact bleeding Bimanual exam: Cervix closed Uterus non tender, unable to size due to habitus Adnexa non tender GC/Chlam, wet prep done Chaperone present for exam.  Musculoskeletal: Normal range of motion.  Neurological: She is alert and oriented to person, place, and time.  Skin: Skin is warm and dry.       Rough, hyperpigmented, raised area less than 1 cm in diameter noted on outer side of left wrist.  Also had similar areas on 2 fingers on right hand.  No tracking seen.  Areas are very isolated and no  other areas on her body have any rash. Legs have very dry, flaky skin.    Psychiatric: She has a normal mood and affect.    MAU Course  Procedures Results for orders placed during the hospital encounter of 08/23/12 (from the past 24 hour(s))  POCT PREGNANCY, URINE     Status: Normal   Collection Time   08/23/12  9:02 PM      Component Value Range   Preg Test, Ur NEGATIVE  NEGATIVE  WET PREP, GENITAL     Status: Abnormal   Collection Time   08/23/12  9:35 PM      Component Value Range   Yeast Wet Prep HPF POC NONE SEEN  NONE SEEN   Trich, Wet Prep NONE SEEN  NONE SEEN   Clue Cells Wet Prep HPF POC FEW (*) NONE SEEN   WBC, Wet Prep HPF POC FEW (*) NONE SEEN    MDM Rash is likely  sensitivity to jewelry given the location and client admits to wearing her jewelry around the clock.  Has used a relative's cream for eczema which seems to help.  Has a primary care doctor and advised to see her doctor for treatment of her rash as it is a non gyn problem.  Assessment and Plan  Normal exam STD testing  Plan Currently vaginal discharge is normal.  Cultures are pending and we will notify you if you need additional treatment. See your primary care doctor about your rash but you may try taking off all jewelry before going to bed and to use a hydrocortisone (anti-itch cream) on it.  Erica Mack 08/23/2012, 9:36 PM

## 2012-08-23 NOTE — MAU Note (Signed)
Pt presents with vaginal white, with fishy odor since Wend. Pt thought this was due to a change in soap. Pt also with rash on hands and wrist bilaterally with itching and small marks noted. Pt denies any other complaints at this time.

## 2012-08-26 LAB — GC/CHLAMYDIA PROBE AMP: CT Probe RNA: POSITIVE — AB

## 2012-09-26 ENCOUNTER — Inpatient Hospital Stay (HOSPITAL_COMMUNITY)
Admission: AD | Admit: 2012-09-26 | Discharge: 2012-09-26 | Disposition: A | Discharge status: Home / Self Care | Payer: Self-pay | Source: Ambulatory Visit | Attending: Obstetrics | Admitting: Obstetrics

## 2012-09-26 ENCOUNTER — Encounter (HOSPITAL_COMMUNITY): Payer: Self-pay | Admitting: *Deleted

## 2012-09-26 DIAGNOSIS — N760 Acute vaginitis: Secondary | ICD-10-CM

## 2012-09-26 DIAGNOSIS — A499 Bacterial infection, unspecified: Secondary | ICD-10-CM | POA: Insufficient documentation

## 2012-09-26 DIAGNOSIS — N949 Unspecified condition associated with female genital organs and menstrual cycle: Secondary | ICD-10-CM | POA: Insufficient documentation

## 2012-09-26 DIAGNOSIS — B9689 Other specified bacterial agents as the cause of diseases classified elsewhere: Secondary | ICD-10-CM | POA: Insufficient documentation

## 2012-09-26 LAB — WET PREP, GENITAL: Trich, Wet Prep: NONE SEEN

## 2012-09-26 MED ORDER — METRONIDAZOLE 500 MG PO TABS: 500.0000 mg | ORAL_TABLET | Freq: Two times a day (BID) | ORAL | Status: DC

## 2012-09-26 MED ORDER — IBUPROFEN 800 MG PO TABS
800.0000 mg | ORAL_TABLET | Freq: Once | ORAL | Status: AC
  Administered 2012-09-26: 800 mg via ORAL
  Filled 2012-09-26: qty 1

## 2012-09-26 NOTE — MAU Note (Signed)
Pt was dx'd with chlamydia "not long ago".  Pt took medication.  "I want to make sure it worked."

## 2012-09-26 NOTE — MAU Note (Signed)
Patient states she has a clear vaginal discharge with no odor for a few days. Denies pain or bleeding.

## 2012-09-26 NOTE — MAU Provider Note (Signed)
History     CSN: [id number]  Arrival date and time: 09/26/12 1533   First Provider Initiated Contact with Patient 09/26/12 1640      Chief Complaint  Patient presents with  . Vaginal Discharge   HPIMarkisa L Mack is 27 y.o. [id number] presents with clear discharge.   She was here several weeks before Christmas for same complaint, told her exam was normal and culture came back + for Chlamydia.  Took medication that was called in.  She has not had intercourse since treatment.  Not dating that partner at this time.  Denies fever or pain.  Wants to make sure her infection cleared.  Has Implanon for contraception.   Past Medical History  Diagnosis Date  . Gonorrhea   . Hypertension     no meds  . Chlamydia contact, treated   . Headache     Past Surgical History  Procedure Date  . No past surgeries     Family History  Problem Relation Age of Onset  . Anesthesia problems Neg Hx   . Hypertension Mother   . Diabetes Brother   . Hypertension Maternal Aunt   . Cancer Maternal Grandmother   . Hypertension Maternal Grandmother     History  Substance Use Topics  . Smoking status: Current Some Day Smoker  . Smokeless tobacco: Not on file  . Alcohol Use: 3.3 oz/week    3 Shots of liquor, 3 Drinks containing 0.5 oz of alcohol per week     Comment: occasional alcohol    Allergies: No Known Allergies  Prescriptions prior to admission  Medication Sig Dispense Refill  . aspirin 325 MG tablet Take 650 mg by mouth every 6 (six) hours as needed. Takes for headaches        Review of Systems  Constitutional: Negative for fever and chills.  Gastrointestinal: Negative for nausea, vomiting and abdominal pain.  Genitourinary:       + clear discharge without odor.   Physical Exam   Blood pressure 128/63, pulse 84, temperature 98.5 F (36.9 C), temperature source Oral, resp. rate 16, height 5\' 6"  (1.676 m), weight 203 lb 12.8 oz (92.443 kg), SpO2 100.00%.  Physical Exam    Constitutional: She is oriented to person, place, and time. She appears well-developed and well-nourished. No distress.  HENT:  Head: Normocephalic.  Neck: Normal range of motion.  Cardiovascular: Normal rate.   Respiratory: Effort normal.  GI: Soft. She exhibits no distension and no mass. There is no tenderness. There is no rebound and no guarding.  Genitourinary: There is no rash, tenderness or lesion on the right labia. There is no rash, tenderness or lesion on the left labia. Uterus is not enlarged and not tender. Cervix exhibits no discharge and no friability. Right adnexum displays no mass, no tenderness and no fullness. Left adnexum displays no mass, no tenderness and no fullness. Vaginal discharge: small amount of frothy discharge with mild odor.  Neurological: She is alert and oriented to person, place, and time.  Skin: Skin is warm and dry.  Psychiatric: She has a normal mood and affect. Her behavior is normal.   Results for orders placed during the hospital encounter of 09/26/12 (from the past 24 hour(s))  WET PREP, GENITAL     Status: Abnormal   Collection Time   09/26/12  5:17 PM      Component Value Range   Yeast Wet Prep HPF POC NONE SEEN  NONE SEEN   Trich, Wet Prep NONE SEEN  NONE SEEN   Clue Cells Wet Prep HPF POC FEW (*) NONE SEEN   WBC, Wet Prep HPF POC FEW (*) NONE SEEN   MAU Course  Procedures  Gc/CHl to lab  MDM 17:20  Reported to Darel Hong, RN that she has a headache and would like something for it.  Will order ibuprofen 800mg  po now.  Assessment and Plan  A:  Bacterial vaginosis     Tx for Chlamydia several weeks ago  P:  Flagyl 500mg  bid X 1week      ETOH warning      Cultures pending.  Erica Mack,EVE M 09/26/2012, 4:40 PM

## 2012-09-27 LAB — GC/CHLAMYDIA PROBE AMP
CT Probe RNA: NEGATIVE
GC Probe RNA: NEGATIVE

## 2012-12-16 ENCOUNTER — Inpatient Hospital Stay (HOSPITAL_COMMUNITY)
Admission: AD | Admit: 2012-12-16 | Discharge: 2012-12-16 | Disposition: A | Discharge status: Home / Self Care | Payer: Self-pay | Source: Ambulatory Visit | Attending: Obstetrics | Admitting: Obstetrics

## 2012-12-16 ENCOUNTER — Encounter (HOSPITAL_COMMUNITY): Payer: Self-pay | Admitting: *Deleted

## 2012-12-16 DIAGNOSIS — B9689 Other specified bacterial agents as the cause of diseases classified elsewhere: Secondary | ICD-10-CM | POA: Insufficient documentation

## 2012-12-16 DIAGNOSIS — N760 Acute vaginitis: Secondary | ICD-10-CM | POA: Insufficient documentation

## 2012-12-16 DIAGNOSIS — Z3202 Encounter for pregnancy test, result negative: Secondary | ICD-10-CM | POA: Insufficient documentation

## 2012-12-16 DIAGNOSIS — A499 Bacterial infection, unspecified: Secondary | ICD-10-CM | POA: Insufficient documentation

## 2012-12-16 DIAGNOSIS — N949 Unspecified condition associated with female genital organs and menstrual cycle: Secondary | ICD-10-CM | POA: Insufficient documentation

## 2012-12-16 LAB — WET PREP, GENITAL: Yeast Wet Prep HPF POC: NONE SEEN

## 2012-12-16 MED ORDER — METRONIDAZOLE 500 MG PO TABS: 500.0000 mg | ORAL_TABLET | Freq: Two times a day (BID) | ORAL | Status: DC

## 2012-12-16 NOTE — MAU Note (Signed)
Patient states she had a "faded" line on a home pregnancy test last week. Has had some creamy vaginal discharge, no pain. Started spotting while in the bathroom getting a urine sample.

## 2012-12-16 NOTE — MAU Note (Signed)
Was suppose to have her Implanon removed last month around the 19th; was concerned about being pregnant; negative UPT today; c/o vaginal discharge also;

## 2012-12-16 NOTE — MAU Provider Note (Signed)
History     CSN: [id number]  Arrival date and time: 12/16/12 1028   First Provider Initiated Contact with Patient 12/16/12 1210      Chief Complaint  Patient presents with  . Possible Pregnancy  . Vaginal Discharge   HPI 27 y.o. [id number] with vaginal discharge, white with some odor. Also concerned because her period is late - she has implanon in and it was due to be removed last month, but couldn't afford to have it taken out. However, she started having spotting and now bleeding like a period since arrival to MAU.    Past Medical History  Diagnosis Date  . Gonorrhea   . Hypertension     no meds  . Chlamydia contact, treated   . Headache     Past Surgical History  Procedure Laterality Date  . No past surgeries      Family History  Problem Relation Age of Onset  . Anesthesia problems Neg Hx   . Hypertension Mother   . Diabetes Brother   . Hypertension Maternal Aunt   . Cancer Maternal Grandmother   . Hypertension Maternal Grandmother     History  Substance Use Topics  . Smoking status: Current Some Day Smoker  . Smokeless tobacco: Not on file  . Alcohol Use: 3.3 oz/week    3 Shots of liquor, 3 Drinks containing 0.5 oz of alcohol per week     Comment: occasional alcohol    Allergies: No Known Allergies  Prescriptions prior to admission  Medication Sig Dispense Refill  . acetaminophen (TYLENOL) 500 MG tablet Take 1,000 mg by mouth at bedtime as needed (for headaches).      . Multiple Vitamins-Minerals (MULTIVITAMINS) CHEW Chew 1 each by mouth daily.        Review of Systems  Constitutional: Negative.   Respiratory: Negative.   Cardiovascular: Negative.   Gastrointestinal: Negative for nausea, vomiting, abdominal pain, diarrhea and constipation.  Genitourinary: Negative for dysuria, urgency, frequency, hematuria and flank pain.       + bleeding and discharge   Musculoskeletal: Negative.   Neurological: Negative.   Psychiatric/Behavioral: Negative.     Physical Exam   Blood pressure 128/72, pulse 86, temperature 98.5 F (36.9 C), temperature source Oral, resp. rate 16, height 5\' 5"  (1.651 m), weight 211 lb 6.4 oz (95.89 kg), last menstrual period 08/01/2012, SpO2 100.00%.  Physical Exam  Nursing note and vitals reviewed. Constitutional: She is oriented to person, place, and time. She appears well-developed and well-nourished. No distress.  Cardiovascular: Normal rate.   Respiratory: Effort normal.  GI: Soft. There is no tenderness.  Genitourinary: There is no tenderness or lesion on the right labia. There is no tenderness or lesion on the left labia. Uterus is not enlarged and not tender. Cervix exhibits no motion tenderness and no friability. Right adnexum displays no mass, no tenderness and no fullness. Left adnexum displays no mass, no tenderness and no fullness. There is bleeding (small) around the vagina. Vaginal discharge (malodorous) found.  Musculoskeletal: Normal range of motion.  Neurological: She is alert and oriented to person, place, and time.  Skin: Skin is warm and dry.  Psychiatric: She has a normal mood and affect.    MAU Course  Procedures  Results for orders placed during the hospital encounter of 12/16/12 (from the past 24 hour(s))  POCT PREGNANCY, URINE     Status: None   Collection Time    12/16/12 11:18 AM      Result Value Range  Preg Test, Ur NEGATIVE  NEGATIVE  WET PREP, GENITAL     Status: Abnormal   Collection Time    12/16/12 12:00 PM      Result Value Range   Yeast Wet Prep HPF POC NONE SEEN  NONE SEEN   Trich, Wet Prep NONE SEEN  NONE SEEN   Clue Cells Wet Prep HPF POC FEW (*) NONE SEEN   WBC, Wet Prep HPF POC RARE (*) NONE SEEN     Assessment and Plan   1. Bacterial vaginosis       Medication List    TAKE these medications       acetaminophen 500 MG tablet  Commonly known as:  TYLENOL  Take 1,000 mg by mouth at bedtime as needed (for headaches).     metroNIDAZOLE 500 MG tablet   Commonly known as:  FLAGYL  Take 1 tablet (500 mg total) by mouth 2 (two) times daily.     MULTIVITAMINS Chew  Chew 1 each by mouth daily.            Follow-up Information   Follow up with HARPER,CHARLES A, MD. (As needed)    Contact information:   [address] [place] [address] [phone]         [facility] 12/16/2012, 12:17 PM

## 2012-12-17 LAB — GC/CHLAMYDIA PROBE AMP
CT Probe RNA: NEGATIVE
GC Probe RNA: NEGATIVE

## 2013-03-08 ENCOUNTER — Inpatient Hospital Stay (HOSPITAL_COMMUNITY)
Admission: AD | Admit: 2013-03-08 | Discharge: 2013-03-08 | Disposition: A | Discharge status: Home / Self Care | Payer: Self-pay | Source: Ambulatory Visit | Attending: Obstetrics | Admitting: Obstetrics

## 2013-03-08 ENCOUNTER — Encounter (HOSPITAL_COMMUNITY): Payer: Self-pay | Admitting: *Deleted

## 2013-03-08 DIAGNOSIS — N949 Unspecified condition associated with female genital organs and menstrual cycle: Secondary | ICD-10-CM | POA: Insufficient documentation

## 2013-03-08 DIAGNOSIS — B9689 Other specified bacterial agents as the cause of diseases classified elsewhere: Secondary | ICD-10-CM

## 2013-03-08 DIAGNOSIS — A499 Bacterial infection, unspecified: Secondary | ICD-10-CM | POA: Insufficient documentation

## 2013-03-08 DIAGNOSIS — N760 Acute vaginitis: Secondary | ICD-10-CM | POA: Insufficient documentation

## 2013-03-08 LAB — WET PREP, GENITAL
Trich, Wet Prep: NONE SEEN
Yeast Wet Prep HPF POC: NONE SEEN

## 2013-03-08 LAB — POCT PREGNANCY, URINE: Preg Test, Ur: NEGATIVE

## 2013-03-08 MED ORDER — IBUPROFEN 600 MG PO TABS
600.0000 mg | ORAL_TABLET | Freq: Once | ORAL | Status: AC
  Administered 2013-03-08: 600 mg via ORAL
  Filled 2013-03-08: qty 1

## 2013-03-08 MED ORDER — METRONIDAZOLE 500 MG PO TABS: 500.0000 mg | ORAL_TABLET | Freq: Two times a day (BID) | ORAL | Status: DC

## 2013-03-08 NOTE — MAU Note (Signed)
Having white creamy d/c with fishy odor.

## 2013-03-08 NOTE — MAU Provider Note (Signed)
  History     CSN: [id number]  Arrival date and time: 03/08/13 2120   First Provider Initiated Contact with Patient 03/08/13 2230      Chief Complaint  Patient presents with  . Vaginal Discharge   HPI  Pt is here with report of white discharge for past week.  Noticed odor starting yesterday.  Patient's last menstrual period was 02/17/2013.  Currently has Nexplanon for birth control.  Denies abdominal pain.  Past Medical History  Diagnosis Date  . Gonorrhea   . Hypertension     no meds  . Chlamydia contact, treated   . [id number](696.2)     Past Surgical History  Procedure Laterality Date  . Dilation and curettage of uterus    . Induced abortion      Family History  Problem Relation Age of Onset  . Anesthesia problems Neg Hx   . Hypertension Mother   . Diabetes Brother   . Hypertension Maternal Aunt   . Cancer Maternal Grandmother   . Hypertension Maternal Grandmother     History  Substance Use Topics  . Smoking status: Current Some Day Smoker  . Smokeless tobacco: Not on file  . Alcohol Use: 3.3 oz/week    3 Shots of liquor, 3 Drinks containing 0.5 oz of alcohol per week     Comment: occasional alcohol    Allergies: No Known Allergies  Prescriptions prior to admission  Medication Sig Dispense Refill  . Multiple Vitamins-Minerals (MULTIVITAMINS) CHEW Chew 1 each by mouth daily.        Review of Systems  Genitourinary:       Vaginal discharge  All other systems reviewed and are negative.   Physical Exam   Blood pressure 125/69, pulse 76, temperature 98.3 F (36.8 C), temperature source Oral, resp. rate 18, height 5\' 6"  (1.676 m), weight 92.443 kg (203 lb 12.8 oz), last menstrual period 02/17/2013, SpO2 100.00%, unknown if currently breastfeeding.  Physical Exam  Constitutional: She is oriented to person, place, and time. She appears well-developed and well-nourished.  HENT:  Head: Normocephalic.  Neck: Normal range of motion. Neck supple.  GI:  Soft. She exhibits no mass. There is no tenderness. There is no guarding.  Genitourinary: Vaginal discharge (white, creamy) found.  +fishy odor  Neurological: She is alert and oriented to person, place, and time. She has normal reflexes.  Skin: Skin is warm and dry.    MAU Course  Procedures Results for orders placed during the hospital encounter of 03/08/13 (from the past 24 hour(s))  POCT PREGNANCY, URINE     Status: None   Collection Time    03/08/13  9:37 PM      Result Value Range   Preg Test, Ur NEGATIVE  NEGATIVE  WET PREP, GENITAL     Status: Abnormal   Collection Time    03/08/13 10:30 PM      Result Value Range   Yeast Wet Prep HPF POC NONE SEEN  NONE SEEN   Trich, Wet Prep NONE SEEN  NONE SEEN   Clue Cells Wet Prep HPF POC MODERATE (*) NONE SEEN   WBC, Wet Prep HPF POC MODERATE (*) NONE SEEN    Assessment and Plan  Bacterial Vaginosis  Plan: DC to home RX Flagyl GC/CT pending Follow-up prn  [facility] 03/08/2013, 11:00 PM

## 2013-03-08 NOTE — Progress Notes (Signed)
Written and verbal d/c instructions given and understanding voiced. 

## 2013-03-10 LAB — GC/CHLAMYDIA PROBE AMP: CT Probe RNA: NEGATIVE

## 2013-05-29 ENCOUNTER — Inpatient Hospital Stay (HOSPITAL_COMMUNITY)
Admission: AD | Admit: 2013-05-29 | Discharge: 2013-05-30 | Disposition: A | Discharge status: Home / Self Care | Payer: Self-pay | Source: Ambulatory Visit | Attending: Obstetrics | Admitting: Obstetrics

## 2013-05-29 ENCOUNTER — Encounter (HOSPITAL_COMMUNITY): Payer: Self-pay | Admitting: *Deleted

## 2013-05-29 DIAGNOSIS — R109 Unspecified abdominal pain: Secondary | ICD-10-CM | POA: Insufficient documentation

## 2013-05-29 DIAGNOSIS — B9689 Other specified bacterial agents as the cause of diseases classified elsewhere: Secondary | ICD-10-CM

## 2013-05-29 DIAGNOSIS — B373 Candidiasis of vulva and vagina: Secondary | ICD-10-CM

## 2013-05-29 DIAGNOSIS — N949 Unspecified condition associated with female genital organs and menstrual cycle: Secondary | ICD-10-CM | POA: Insufficient documentation

## 2013-05-29 NOTE — MAU Note (Signed)
Pt reports vaginal discharge x 2 days, white with odor. Lower abd pain x 1 week, off/on. LMP 05/05/2013

## 2013-05-30 DIAGNOSIS — B373 Candidiasis of vulva and vagina: Secondary | ICD-10-CM

## 2013-05-30 LAB — URINALYSIS, ROUTINE W REFLEX MICROSCOPIC
Bilirubin Urine: NEGATIVE
Glucose, UA: NEGATIVE mg/dL
Specific Gravity, Urine: 1.015 (ref 1.005–1.030)
Urobilinogen, UA: 0.2 mg/dL (ref 0.0–1.0)
pH: 6 (ref 5.0–8.0)

## 2013-05-30 LAB — WET PREP, GENITAL: Trich, Wet Prep: NONE SEEN

## 2013-05-30 LAB — GC/CHLAMYDIA PROBE AMP
CT Probe RNA: NEGATIVE
GC Probe RNA: NEGATIVE

## 2013-05-30 LAB — URINE MICROSCOPIC-ADD ON

## 2013-05-30 MED ORDER — METRONIDAZOLE 500 MG PO TABS: 500.0000 mg | ORAL_TABLET | Freq: Two times a day (BID) | ORAL | Status: AC

## 2013-05-30 MED ORDER — FLUCONAZOLE 150 MG PO TABS
150.0000 mg | ORAL_TABLET | ORAL | Status: AC
Start: 2013-05-30 — End: 2013-05-30
  Administered 2013-05-30: 150 mg via ORAL
  Filled 2013-05-30: qty 1

## 2013-07-21 ENCOUNTER — Emergency Department (HOSPITAL_COMMUNITY)
Admission: EM | Admit: 2013-07-21 | Discharge: 2013-07-21 | Disposition: A | Discharge status: Home / Self Care | Payer: Self-pay | Attending: Emergency Medicine | Admitting: Emergency Medicine

## 2013-07-21 ENCOUNTER — Encounter (HOSPITAL_COMMUNITY): Payer: Self-pay | Admitting: Emergency Medicine

## 2013-07-21 DIAGNOSIS — T7840XA Allergy, unspecified, initial encounter: Secondary | ICD-10-CM

## 2013-07-21 DIAGNOSIS — Z79899 Other long term (current) drug therapy: Secondary | ICD-10-CM | POA: Insufficient documentation

## 2013-07-21 DIAGNOSIS — Z8619 Personal history of other infectious and parasitic diseases: Secondary | ICD-10-CM | POA: Insufficient documentation

## 2013-07-21 DIAGNOSIS — R21 Rash and other nonspecific skin eruption: Secondary | ICD-10-CM | POA: Insufficient documentation

## 2013-07-21 DIAGNOSIS — F17200 Nicotine dependence, unspecified, uncomplicated: Secondary | ICD-10-CM | POA: Insufficient documentation

## 2013-07-21 DIAGNOSIS — I10 Essential (primary) hypertension: Secondary | ICD-10-CM | POA: Insufficient documentation

## 2013-07-21 MED ORDER — HYDROXYZINE HCL 25 MG PO TABS
50.0000 mg | ORAL_TABLET | Freq: Three times a day (TID) | ORAL | Status: DC | PRN
  Administered 2013-07-21: 50 mg via ORAL
  Filled 2013-07-21 (×2): qty 2

## 2013-07-21 MED ORDER — HYDROXYZINE HCL 50 MG/ML IM SOLN: 50.0000 mg | Freq: Four times a day (QID) | INTRAMUSCULAR | Status: DC | PRN

## 2013-07-21 MED ORDER — DIPHENHYDRAMINE HCL 50 MG/ML IJ SOLN
25.0000 mg | Freq: Once | INTRAMUSCULAR | Status: AC
  Administered 2013-07-21: 25 mg via INTRAMUSCULAR
  Filled 2013-07-21: qty 1

## 2013-07-21 MED ORDER — HYDROXYZINE HCL 25 MG PO TABS: 50.0000 mg | ORAL_TABLET | Freq: Once | ORAL | Status: DC

## 2013-07-21 NOTE — ED Provider Notes (Signed)
CSN: [id number]     Arrival date & time 07/21/13  1103 History  This chart was scribed for non-physician practitioner Irish Elders, NP, working with Shelda Jakes, MD by Dorothey Baseman, ED Scribe. This patient was seen in room TR07C/TR07C and the patient's care was started at 11:23 AM.    Chief Complaint  Patient presents with  . Rash   Patient is a 27 y.o. female presenting with rash. The history is provided by the patient. No language interpreter was used.  Rash Location:  Full body Quality: itchiness   Severity:  Moderate Onset quality:  Sudden Timing:  Constant Chronicity:  New Context: animal contact and food   Relieved by:  None tried Worsened by:  Nothing tried Ineffective treatments:  None tried Associated symptoms: no fever and no shortness of breath    HPI Comments: Erica Mack is a 27 y.o. female who presents to the Emergency Department complaining of an itching rash to the bilateral arms, chest, and right-sided upper back onset yesterday. Patient reports that the rash developed after staying at a friend's house where she was around dogs 2 days ago. Patient also reports that she ate crab for the first time 3 days ago. She denies any recent changes in household products. Patient denies taking any medications at home to treat her symptoms. She denies any fever, chills, or shortness of breath. She denies any allergies. Patient reports a history of HTN.  Past Medical History  Diagnosis Date  . Gonorrhea   . Hypertension     no meds  . Chlamydia contact, treated   . [id number](132.4)    Past Surgical History  Procedure Laterality Date  . Dilation and curettage of uterus    . Induced abortion     Family History  Problem Relation Age of Onset  . Anesthesia problems Neg Hx   . Hypertension Mother   . Diabetes Brother   . Hypertension Maternal Aunt   . Cancer Maternal Grandmother   . Hypertension Maternal Grandmother    History  Substance Use Topics  . Smoking  status: Current Some Day Smoker  . Smokeless tobacco: Not on file  . Alcohol Use: 3.3 oz/week    3 Shots of liquor, 3 Drinks containing 0.5 oz of alcohol per week     Comment: occasional alcohol   OB History   Grav Para Term Preterm Abortions TAB SAB Ect Mult Living   3 2 2  1 1    2      Review of Systems  Constitutional: Negative for fever and chills.  Respiratory: Negative for shortness of breath.   Skin: Positive for rash.    Allergies  Review of patient's allergies indicates no known allergies.  Home Medications   Current Outpatient Rx  Name  Route  Sig  Dispense  Refill  . Multiple Vitamins-Minerals (MULTIVITAMINS) CHEW   Oral   Chew 1 each by mouth daily.          Triage Vitals: BP 130/75  Pulse 83  Temp(Src) 97.8 F (36.6 C) (Oral)  Resp 18  Ht 5\' 6"  (1.676 m)  Wt 201 lb 4 oz (91.286 kg)  BMI 32.5 kg/m2  SpO2 100%  LMP 07/18/2013  Physical Exam  Nursing note and vitals reviewed. Constitutional: She is oriented to person, place, and time. She appears well-developed and well-nourished. No distress.  HENT:  Head: Normocephalic and atraumatic.  Eyes: Conjunctivae are normal.  Neck: Normal range of motion. Neck supple.  Pulmonary/Chest: Effort  normal. No respiratory distress.  Abdominal: She exhibits no distension.  Musculoskeletal: Normal range of motion.  Neurological: She is alert and oriented to person, place, and time.  Skin: Skin is warm and dry. Rash noted.  Urticarial rash to the back, right shoulder, breast, and right-sided face.   Psychiatric: She has a normal mood and affect. Her behavior is normal.    ED Course  Procedures (including critical care time)  DIAGNOSTIC STUDIES: Oxygen Saturation is 100% on room air, normal by my interpretation.    COORDINATION OF CARE: 11:25 AM- Discussed that symptoms appear to be due to an allergic reaction. Will order Benadryl to manage symptoms. Advised patient to apply topical Benadryl and hydrocortisone  creams at home until symptoms subside. Advised patient to avoid eating foods that contain crab or other shellfish. Discussed treatment plan with patient at bedside and patient verbalized agreement.      Labs Review Labs Reviewed - No data to display Imaging Review No results found.  EKG Interpretation   None       MDM   1. Allergic reaction, initial encounter    Urticarial rash. Spent the weekend with a friend. Denies any new lotions, soaps or shampoo. Ate crab for the first time this weekend but breakout occurred 36hrs afterwards. Denies fever, chills or recent illness. Vistaril by mouth here and instructed to use Benadryl and hydrocortisone cream at home prn.   I personally performed the services described in this documentation, which was scribed in my presence. The recorded information has been reviewed and is accurate.     Irish Elders, NP 07/21/13 1154

## 2013-07-21 NOTE — ED Notes (Signed)
Pt reports to the ED for eval of itching rash to bilateral arms, chest, and back. Pt reports that she stayed at a friends house on Saturday and developed the rash afterwards. Raised welts noted to affected areas. Pt denies any fevers, chills, or SOB. Airway is intact and resp e/u. Pt A&O x4.

## 2013-07-22 NOTE — ED Provider Notes (Signed)
Medical screening examination/treatment/procedure(s) were performed by non-physician practitioner and as supervising physician I was immediately available for consultation/collaboration.  EKG Interpretation   None         Shelda Jakes, MD 07/22/13 [phone]

## 2013-08-19 ENCOUNTER — Encounter (HOSPITAL_COMMUNITY): Payer: Self-pay | Admitting: Emergency Medicine

## 2013-08-19 ENCOUNTER — Emergency Department (HOSPITAL_COMMUNITY)
Admission: EM | Admit: 2013-08-19 | Discharge: 2013-08-19 | Disposition: A | Discharge status: Home / Self Care | Payer: Self-pay | Attending: Emergency Medicine | Admitting: Emergency Medicine

## 2013-08-19 DIAGNOSIS — F17200 Nicotine dependence, unspecified, uncomplicated: Secondary | ICD-10-CM | POA: Insufficient documentation

## 2013-08-19 DIAGNOSIS — M79602 Pain in left arm: Secondary | ICD-10-CM

## 2013-08-19 DIAGNOSIS — Z8619 Personal history of other infectious and parasitic diseases: Secondary | ICD-10-CM | POA: Insufficient documentation

## 2013-08-19 DIAGNOSIS — M79609 Pain in unspecified limb: Secondary | ICD-10-CM | POA: Insufficient documentation

## 2013-08-19 DIAGNOSIS — I10 Essential (primary) hypertension: Secondary | ICD-10-CM | POA: Insufficient documentation

## 2013-08-19 DIAGNOSIS — R51 Headache: Secondary | ICD-10-CM | POA: Insufficient documentation

## 2013-08-19 MED ORDER — TRAMADOL HCL 50 MG PO TABS: 50.0000 mg | ORAL_TABLET | Freq: Four times a day (QID) | ORAL | Status: DC | PRN

## 2013-08-19 NOTE — ED Notes (Signed)
Pt has had implant in left arm for birthcontrol which she has had for almost 4 years.  Pt states that she has had intermittent numbness in left arm for over a month and pain where the implant is.  Pt states that she has had some numbness and tingling intermittently for a month in left arm, no other numbness or other neurological symptoms with this.  Pt reports intermittent "migraine headaches" for a month also.  Pt is alert and oriented.

## 2013-08-19 NOTE — ED Provider Notes (Signed)
CSN: [id number]     Arrival date & time 08/19/13  1807 History   First MD Initiated Contact with Patient 08/19/13 2002     Chief Complaint  Patient presents with  . Numbness   (Consider location/radiation/quality/duration/timing/severity/associated sxs/prior Treatment) HPI Comments: Patient is a 27 year old female with history of hypertension. She presents today with complaints of wanting her Implanon removed from her left arm. She states that it is giving her numbness and she is also having intermittent headaches. She is convinced this is related to this device unlike to have it removed. She states this was placed after the birth of her 62-year-old but has not been back to the doctor since that time. She denies any fevers or chills. She denies any visual complaints. There are no aggravating or alleviating factors.  The history is provided by the patient.    Past Medical History  Diagnosis Date  . Gonorrhea   . Hypertension     no meds  . Chlamydia contact, treated   . [id number](478.2)    Past Surgical History  Procedure Laterality Date  . Dilation and curettage of uterus    . Induced abortion     Family History  Problem Relation Age of Onset  . Anesthesia problems Neg Hx   . Hypertension Mother   . Diabetes Brother   . Hypertension Maternal Aunt   . Cancer Maternal Grandmother   . Hypertension Maternal Grandmother    History  Substance Use Topics  . Smoking status: Current Some Day Smoker  . Smokeless tobacco: Not on file  . Alcohol Use: 3.3 oz/week    3 Shots of liquor, 3 Drinks containing 0.5 oz of alcohol per week     Comment: occasional alcohol   OB History   Grav Para Term Preterm Abortions TAB SAB Ect Mult Living   3 2 2  1 1    2      Review of Systems  All other systems reviewed and are negative.    Allergies  Review of patient's allergies indicates no known allergies.  Home Medications   Current Outpatient Rx  Name  Route  Sig  Dispense  Refill  .  Multiple Vitamins-Minerals (MULTIVITAMINS) CHEW   Oral   Chew 1 each by mouth daily.          BP 140/83  Pulse 73  Temp(Src) 98 F (36.7 C)  Resp 16  Ht 5\' 6"  (1.676 m)  Wt 204 lb 12.8 oz (92.897 kg)  BMI 33.07 kg/m2  SpO2 99%  LMP 08/13/2013 Physical Exam  Nursing note and vitals reviewed. Constitutional: She is oriented to person, place, and time. She appears well-developed and well-nourished. No distress.  HENT:  Head: Normocephalic and atraumatic.  Eyes: EOM are normal. Pupils are equal, round, and reactive to light.  Neck: Normal range of motion. Neck supple.  Cardiovascular: Normal rate and regular rhythm.  Exam reveals no gallop and no friction rub.   No murmur heard. Pulmonary/Chest: Effort normal and breath sounds normal. No respiratory distress. She has no wheezes.  Abdominal: Soft. Bowel sounds are normal. She exhibits no distension. There is no tenderness.  Musculoskeletal: Normal range of motion.  The left upper extremity appears grossly normal. There is no redness, warmth, or erythema overlying the Implanon site. The distal pulses are intact as are motor and sensory.  Neurological: She is alert and oriented to person, place, and time. No cranial nerve deficit. She exhibits normal muscle tone. Coordination normal.  Skin: Skin is  warm and dry. She is not diaphoretic.    ED Course  Procedures (including critical care time) Labs Review Labs Reviewed - No data to display Imaging Review No results found.    MDM  No diagnosis found. Patient is here requesting that her Implanon be removed. She is also complaining of headaches that she believes are related to this. Her neurologic exam is nonfocal and there is nothing in the upper extremity exam that makes me feel as though an emergent removal is necessary. I will give her the number for [facility] and she can call and make arrangements to have this removed.    Geoffery Lyons, MD 08/19/13 2018

## 2013-08-19 NOTE — ED Notes (Signed)
Pt reports she had her birth control implant placed in February 2011. Pt states she was suppose to have it removed this past February. Pt states she hopes someone can remove her implant here in the ED because it needs to come out. Pt states the site in her left upper arm where the implant is, is sore to the touch. Pt states she noticed the numbness in her left hand/arm around the same time she noticed the pain in her upper arm. Pt describes her pain as sore.

## 2013-09-11 ENCOUNTER — Emergency Department (HOSPITAL_COMMUNITY)
Admission: EM | Admit: 2013-09-11 | Discharge: 2013-09-11 | Discharge status: Against Medical Advice | Payer: Self-pay | Attending: Emergency Medicine | Admitting: Emergency Medicine

## 2013-09-11 ENCOUNTER — Encounter (HOSPITAL_COMMUNITY): Payer: Self-pay | Admitting: Emergency Medicine

## 2013-09-11 DIAGNOSIS — F17200 Nicotine dependence, unspecified, uncomplicated: Secondary | ICD-10-CM | POA: Insufficient documentation

## 2013-09-11 DIAGNOSIS — H5710 Ocular pain, unspecified eye: Secondary | ICD-10-CM | POA: Insufficient documentation

## 2013-09-11 NOTE — ED Notes (Signed)
Pt called with no response in the main ED waiting area

## 2013-09-11 NOTE — ED Notes (Signed)
Pt sts she was getting her eyelashes put on and something is now in her eye. sts eye irritated and watering.

## 2013-09-11 NOTE — ED Notes (Signed)
Called times 2 , pt did not answer

## 2013-11-09 ENCOUNTER — Encounter (HOSPITAL_COMMUNITY): Payer: Self-pay | Admitting: *Deleted

## 2013-11-09 ENCOUNTER — Inpatient Hospital Stay (HOSPITAL_COMMUNITY)
Admission: AD | Admit: 2013-11-09 | Discharge: 2013-11-09 | Disposition: A | Discharge status: Home / Self Care | Payer: No Typology Code available for payment source | Source: Ambulatory Visit | Attending: Obstetrics and Gynecology | Admitting: Obstetrics and Gynecology

## 2013-11-09 DIAGNOSIS — B9689 Other specified bacterial agents as the cause of diseases classified elsewhere: Secondary | ICD-10-CM | POA: Insufficient documentation

## 2013-11-09 DIAGNOSIS — F17200 Nicotine dependence, unspecified, uncomplicated: Secondary | ICD-10-CM | POA: Insufficient documentation

## 2013-11-09 DIAGNOSIS — A499 Bacterial infection, unspecified: Secondary | ICD-10-CM | POA: Insufficient documentation

## 2013-11-09 DIAGNOSIS — O2390 Unspecified genitourinary tract infection in pregnancy, unspecified trimester: Secondary | ICD-10-CM | POA: Insufficient documentation

## 2013-11-09 DIAGNOSIS — N760 Acute vaginitis: Secondary | ICD-10-CM | POA: Insufficient documentation

## 2013-11-09 LAB — URINE MICROSCOPIC-ADD ON

## 2013-11-09 LAB — WET PREP, GENITAL
Trich, Wet Prep: NONE SEEN
Yeast Wet Prep HPF POC: NONE SEEN

## 2013-11-09 LAB — URINALYSIS, ROUTINE W REFLEX MICROSCOPIC
Bilirubin Urine: NEGATIVE
GLUCOSE, UA: NEGATIVE mg/dL
Ketones, ur: NEGATIVE mg/dL
LEUKOCYTES UA: NEGATIVE
Nitrite: NEGATIVE
PH: 6 (ref 5.0–8.0)
Protein, ur: NEGATIVE mg/dL
SPECIFIC GRAVITY, URINE: 1.025 (ref 1.005–1.030)
Urobilinogen, UA: 0.2 mg/dL (ref 0.0–1.0)

## 2013-11-09 LAB — POCT PREGNANCY, URINE: PREG TEST UR: POSITIVE — AB

## 2013-11-09 MED ORDER — METRONIDAZOLE 500 MG PO TABS: 500.0000 mg | ORAL_TABLET | Freq: Two times a day (BID) | ORAL | Status: DC

## 2013-11-09 NOTE — MAU Provider Note (Signed)
Attestation of Attending Supervision of Advanced Practitioner (CNM/NP): Evaluation and management procedures were performed by the Advanced Practitioner under my supervision and collaboration.  I have reviewed the Advanced Practitioner's note and chart, and I agree with the management and plan.  Jakeia Carreras 11/09/2013 1:05 PM

## 2013-11-09 NOTE — Discharge Instructions (Signed)

## 2013-11-09 NOTE — MAU Note (Signed)
Patient presents with complaint of a vaginal discharge X a couple of weeks.

## 2013-11-09 NOTE — MAU Provider Note (Signed)
History     CSN: [id number]  Arrival date and time: 11/09/13 0932   None     Chief Complaint  Patient presents with  . Vaginal Discharge   HPI 28 y.o. G3P2012 at 4.[redacted] weeks EGA by LMP with vaginal discharge x 2-3 weeks. Denies pain, odor, itching. Plans PNC with [facility].   Past Medical History  Diagnosis Date  . Gonorrhea   . Hypertension     no meds  . Chlamydia contact, treated   . QIONGEXB(284.1Headache(784.0)     Past Surgical History  Procedure Laterality Date  . Dilation and curettage of uterus    . Induced abortion      Family History  Problem Relation Age of Onset  . Anesthesia problems Neg Hx   . Hypertension Mother   . Diabetes Brother   . Hypertension Maternal Aunt   . Cancer Maternal Grandmother   . Hypertension Maternal Grandmother     History  Substance Use Topics  . Smoking status: Current Some Day Smoker  . Smokeless tobacco: Never Used  . Alcohol Use: 3.3 oz/week    3 Shots of liquor, 3 Drinks containing 0.5 oz of alcohol per week     Comment: occasional alcohol    Allergies: No Known Allergies  Prescriptions prior to admission  Medication Sig Dispense Refill  . Multiple Vitamins-Minerals (MULTIVITAMINS) CHEW Chew 1 each by mouth daily.      . traMADol (ULTRAM) 50 MG tablet Take 1 tablet (50 mg total) by mouth every 6 (six) hours as needed.  15 tablet  0    Review of Systems  Constitutional: Negative.   Respiratory: Negative.   Cardiovascular: Negative.   Gastrointestinal: Negative for nausea, vomiting, abdominal pain, diarrhea and constipation.  Genitourinary: Negative for dysuria, urgency, frequency, hematuria and flank pain.       Negative for vaginal bleeding, + discharge  Musculoskeletal: Negative.   Neurological: Negative.   Psychiatric/Behavioral: Negative.    Physical Exam   Blood pressure 132/65, pulse 92, temperature 98.3 F (36.8 C), temperature source Oral, resp. rate 20, height 5' 6.5" (1.689 m), weight 206 lb 2 oz (93.498 kg),  last menstrual period 10/11/2013.  Physical Exam  Nursing note and vitals reviewed. Constitutional: She is oriented to person, place, and time. She appears well-developed and well-nourished. No distress.  HENT:  Head: Normocephalic and atraumatic.  Cardiovascular: Normal rate and regular rhythm.   Respiratory: Effort normal. No respiratory distress.  GI: Soft. She exhibits no distension and no mass. There is no tenderness. There is no rebound and no guarding.  Genitourinary: There is no rash or lesion on the right labia. There is no rash or lesion on the left labia. Uterus is not deviated, not enlarged, not fixed and not tender. Cervix exhibits no motion tenderness, no discharge and no friability. Right adnexum displays no mass, no tenderness and no fullness. Left adnexum displays no mass, no tenderness and no fullness. No erythema, tenderness or bleeding around the vagina. Vaginal discharge (malodorous white ) found.  Neurological: She is alert and oriented to person, place, and time.  Skin: Skin is warm and dry.  Psychiatric: She has a normal mood and affect.    MAU Course  Procedures Results for orders placed during the hospital encounter of 11/09/13 (from the past 24 hour(s))  URINALYSIS, ROUTINE W REFLEX MICROSCOPIC     Status: Abnormal   Collection Time    11/09/13  9:48 AM      Result Value Ref Range  Color, Urine YELLOW  YELLOW   APPearance CLEAR  CLEAR   Specific Gravity, Urine 1.025  1.005 - 1.030   pH 6.0  5.0 - 8.0   Glucose, UA NEGATIVE  NEGATIVE mg/dL   Hgb urine dipstick TRACE (*) NEGATIVE   Bilirubin Urine NEGATIVE  NEGATIVE   Ketones, ur NEGATIVE  NEGATIVE mg/dL   Protein, ur NEGATIVE  NEGATIVE mg/dL   Urobilinogen, UA 0.2  0.0 - 1.0 mg/dL   Nitrite NEGATIVE  NEGATIVE   Leukocytes, UA NEGATIVE  NEGATIVE  URINE MICROSCOPIC-ADD ON     Status: Abnormal   Collection Time    11/09/13  9:48 AM      Result Value Ref Range   Squamous Epithelial / LPF MANY (*) RARE    WBC, UA 0-2  <3 WBC/hpf   RBC / HPF 3-6  <3 RBC/hpf   Urine-Other MUCOUS PRESENT    POCT PREGNANCY, URINE     Status: Abnormal   Collection Time    11/09/13  9:56 AM      Result Value Ref Range   Preg Test, Ur POSITIVE (*) NEGATIVE  WET PREP, GENITAL     Status: Abnormal   Collection Time    11/09/13 10:50 AM      Result Value Ref Range   Yeast Wet Prep HPF POC NONE SEEN  NONE SEEN   Trich, Wet Prep NONE SEEN  NONE SEEN   Clue Cells Wet Prep HPF POC MODERATE (*) NONE SEEN   WBC, Wet Prep HPF POC MODERATE (*) NONE SEEN     Assessment and Plan   1. BV (bacterial vaginosis)       Medication List         aspirin-acetaminophen-caffeine 250-250-65 MG per tablet  Commonly known as:  EXCEDRIN MIGRAINE  Take 2 tablets by mouth every 6 (six) hours as needed for headache.     metroNIDAZOLE 500 MG tablet  Commonly known as:  FLAGYL  Take 1 tablet (500 mg total) by mouth 2 (two) times daily.            Follow-up Information   Schedule an appointment as soon as possible for a visit with [facility] (as soon as possible to begin prenatal care)    Contact information:   [address] [place] [address] [phone]        [facility] 11/09/2013, 10:41 AM

## 2013-11-11 LAB — GC/CHLAMYDIA PROBE AMP
CT PROBE, AMP APTIMA: NEGATIVE
GC PROBE AMP APTIMA: NEGATIVE

## 2013-11-18 ENCOUNTER — Inpatient Hospital Stay (HOSPITAL_COMMUNITY): Payer: No Typology Code available for payment source

## 2013-11-18 ENCOUNTER — Encounter (HOSPITAL_COMMUNITY): Payer: Self-pay | Admitting: *Deleted

## 2013-11-18 ENCOUNTER — Inpatient Hospital Stay (HOSPITAL_COMMUNITY)
Admission: AD | Admit: 2013-11-18 | Discharge: 2013-11-18 | Disposition: A | Discharge status: Home / Self Care | Payer: No Typology Code available for payment source | Source: Ambulatory Visit | Attending: Family Medicine | Admitting: Family Medicine

## 2013-11-18 DIAGNOSIS — O10019 Pre-existing essential hypertension complicating pregnancy, unspecified trimester: Secondary | ICD-10-CM | POA: Insufficient documentation

## 2013-11-18 DIAGNOSIS — O99330 Smoking (tobacco) complicating pregnancy, unspecified trimester: Secondary | ICD-10-CM | POA: Insufficient documentation

## 2013-11-18 DIAGNOSIS — N949 Unspecified condition associated with female genital organs and menstrual cycle: Secondary | ICD-10-CM | POA: Insufficient documentation

## 2013-11-18 DIAGNOSIS — O9989 Other specified diseases and conditions complicating pregnancy, childbirth and the puerperium: Secondary | ICD-10-CM

## 2013-11-18 DIAGNOSIS — R109 Unspecified abdominal pain: Secondary | ICD-10-CM

## 2013-11-18 DIAGNOSIS — O219 Vomiting of pregnancy, unspecified: Secondary | ICD-10-CM

## 2013-11-18 DIAGNOSIS — O26899 Other specified pregnancy related conditions, unspecified trimester: Secondary | ICD-10-CM

## 2013-11-18 DIAGNOSIS — O210 Mild hyperemesis gravidarum: Secondary | ICD-10-CM | POA: Insufficient documentation

## 2013-11-18 LAB — COMPREHENSIVE METABOLIC PANEL
ALBUMIN: 3.6 g/dL (ref 3.5–5.2)
ALK PHOS: 54 U/L (ref 39–117)
ALT: 14 U/L (ref 0–35)
AST: 16 U/L (ref 0–37)
BUN: 9 mg/dL (ref 6–23)
CHLORIDE: 97 meq/L (ref 96–112)
CO2: 24 meq/L (ref 19–32)
CREATININE: 0.72 mg/dL (ref 0.50–1.10)
Calcium: 9.3 mg/dL (ref 8.4–10.5)
GLUCOSE: 92 mg/dL (ref 70–99)
POTASSIUM: 3.7 meq/L (ref 3.7–5.3)
Sodium: 133 mEq/L — ABNORMAL LOW (ref 137–147)
Total Bilirubin: 0.3 mg/dL (ref 0.3–1.2)
Total Protein: 7.1 g/dL (ref 6.0–8.3)

## 2013-11-18 LAB — URINALYSIS, ROUTINE W REFLEX MICROSCOPIC
Bilirubin Urine: NEGATIVE
Glucose, UA: NEGATIVE mg/dL
Hgb urine dipstick: NEGATIVE
KETONES UR: NEGATIVE mg/dL
NITRITE: NEGATIVE
Protein, ur: NEGATIVE mg/dL
SPECIFIC GRAVITY, URINE: 1.02 (ref 1.005–1.030)
UROBILINOGEN UA: 0.2 mg/dL (ref 0.0–1.0)
pH: 6.5 (ref 5.0–8.0)

## 2013-11-18 LAB — URINE MICROSCOPIC-ADD ON

## 2013-11-18 LAB — CBC
HEMATOCRIT: 35.6 % — AB (ref 36.0–46.0)
Hemoglobin: 12.4 g/dL (ref 12.0–15.0)
MCH: 28.9 pg (ref 26.0–34.0)
MCHC: 34.8 g/dL (ref 30.0–36.0)
MCV: 83 fL (ref 78.0–100.0)
PLATELETS: 270 10*3/uL (ref 150–400)
RBC: 4.29 MIL/uL (ref 3.87–5.11)
RDW: 13.3 % (ref 11.5–15.5)
WBC: 13.8 10*3/uL — AB (ref 4.0–10.5)

## 2013-11-18 LAB — HCG, QUANTITATIVE, PREGNANCY: hCG, Beta Chain, Quant, S: 21651 m[IU]/mL — ABNORMAL HIGH (ref <5)

## 2013-11-18 MED ORDER — PROMETHAZINE HCL 25 MG PO TABS
25.0000 mg | ORAL_TABLET | Freq: Four times a day (QID) | ORAL | Status: DC | PRN
Start: 2013-11-18 — End: 2014-07-13

## 2013-11-18 MED ORDER — ACETAMINOPHEN 500 MG PO TABS
500.0000 mg | ORAL_TABLET | Freq: Once | ORAL | Status: AC
  Administered 2013-11-18: 500 mg via ORAL
  Filled 2013-11-18: qty 1

## 2013-11-18 MED ORDER — ONDANSETRON 8 MG PO TBDP
8.0000 mg | ORAL_TABLET | Freq: Once | ORAL | Status: AC
  Administered 2013-11-18: 8 mg via ORAL
  Filled 2013-11-18: qty 1

## 2013-11-18 MED ORDER — ONDANSETRON HCL 4 MG PO TABS: 4.0000 mg | ORAL_TABLET | Freq: Four times a day (QID) | ORAL | Status: DC

## 2013-11-18 NOTE — MAU Note (Addendum)
Vomiting all day long for past 2 days. No fever or diarrhea. Has no medication at home for N/V. Also has a headache that started last night.

## 2013-11-18 NOTE — Discharge Instructions (Signed)
Pregnancy, First Trimester °The first trimester is the first 3 months your baby is growing inside you. It is important to follow your doctor's instructions. °HOME CARE  °· Do not smoke. °· Do not drink alcohol. °· Only take medicine as told by your doctor. °· Exercise. °· Eat healthy foods. Eat regular, well-balanced meals. °· You can have sex (intercourse) if there are no other problems with the pregnancy. °· Things that help with morning sickness: °· Eat soda crackers before getting up in the morning. °· Eat 4 to 5 small meals rather than 3 large meals. °· Drink liquids between meals, not during meals. °· Go to all appointments as told. °· Take all vitamins or supplements as told by your doctor. °GET HELP RIGHT AWAY IF:  °· You develop a fever. °· You have a bad smelling fluid that is leaking from your vagina. °· There is bleeding from the vagina. °· You develop severe belly (abdominal) or back pain. °· You throw up (vomit) blood. It may look like coffee grounds. °· You lose more than 2 pounds in a week. °· You gain 5 pounds or more in a week. °· You gain more than 2 pounds in a week and you see puffiness (swelling) in your feet, ankles, or legs. °· You have severe dizziness or pass out (faint). °· You are around people who have German measles, chickenpox, or fifth disease. °· You have a headache, watery poop (diarrhea), pain with peeing (urinating), or cannot breath right. °Document Released: 02/28/2008 Document Revised: 12/04/2011 Document Reviewed: 02/28/2008 °ExitCare® Patient Information ©2014 [employer]. °Hyperemesis Gravidarum Diet °Hyperemesis gravidarum is a severe form of morning sickness. It is characterized by frequent and severe vomiting. It happens during the first trimester of pregnancy. It may be caused by the rapid hormone changes that happen during pregnancy. It is associated with a 5% weight loss of pre-pregnancy weight. The hyperemesis diet may be used to lessen symptoms of nausea and  vomiting. °EATING GUIDELINES °· Eat 5 to 6 small meals daily instead of 3 large meals. °· Avoid foods with strong smells. °· Avoid drinking 30 minutes before and after meals. °· Avoid fried or high-fat foods, such as butter and cream sauces. °· Starchy foods are usually well-tolerated, such as cereal, toast, bread, potatoes, pasta, rice, and pretzels. °· Eat crackers before you get out of bed in the morning. °· Avoid spicy foods. °· Ginger may help with nausea. Add ¼ tsp ginger to hot tea or choose ginger tea. °· Continue to take your prenatal vitamins as directed by your caregiver. °SAMPLE MEAL PLAN °Breakfast  °· ½ cup oatmeal °· 1 slice toast °· 1 tsp heart-healthy margarine °· 1 tsp jelly °· 1 scrambled egg °Midmorning Snack  °· 1 cup low-fat yogurt °Lunch  °· Plain ham sandwich °· Carrot or celery sticks °· 1 small apple °· 3 graham crackers °Midafternoon Snack  °· Cheese and crackers °Dinner °· 4 oz pork tenderloin °· 1 small baked potato °· 1 tsp margarine °· ½ cup broccoli °· ½ cup grapes °Evening Snack °· 1 cup pudding °Document Released: 07/09/2007 Document Revised: 12/04/2011 Document Reviewed: 02/11/2013 °ExitCare® Patient Information ©2014 [employer]. ° °

## 2013-11-18 NOTE — MAU Provider Note (Signed)
History     CSN: [id number]  Arrival date and time: 11/18/13 [id number]   First Provider Initiated Contact with Patient 11/18/13 1915      Chief Complaint  Patient presents with  . Emesis During Pregnancy  . Headache   HPI Pt is a 28 yo G4P2012 at 5150w3d wks IUP here with nausea and vomiting x two weeks.  Reports vomiting has increased in past two days.  +pelvic pain (bilat).  No report of vaginal bleeding.  Prescribed flagyl for BV on 11/04/13.  Reports vomiting approximately three times in past 24 hours, unable to eat or drink.    Past Medical History  Diagnosis Date  . Gonorrhea   . Chlamydia contact, treated   . Headache(784.0)   . Hypertension     no meds; dx 2008    Past Surgical History  Procedure Laterality Date  . Dilation and curettage of uterus    . Induced abortion      Family History  Problem Relation Age of Onset  . Anesthesia problems Neg Hx   . Hypertension Mother   . Diabetes Brother   . Hypertension Maternal Aunt   . Cancer Maternal Grandmother   . Hypertension Maternal Grandmother     History  Substance Use Topics  . Smoking status: Current Some Day Smoker  . Smokeless tobacco: Never Used  . Alcohol Use: 3.3 oz/week    3 Shots of liquor, 3 Drinks containing 0.5 oz of alcohol per week     Comment: occasional alcohol    Allergies: No Known Allergies  Prescriptions prior to admission  Medication Sig Dispense Refill  . aspirin-acetaminophen-caffeine (EXCEDRIN MIGRAINE) 250-250-65 MG per tablet Take 2 tablets by mouth every 6 (six) hours as needed for headache.      . metroNIDAZOLE (FLAGYL) 500 MG tablet Take 1 tablet (500 mg total) by mouth 2 (two) times daily.  14 tablet  0    Review of Systems  Constitutional: Positive for chills. Negative for fever.  Gastrointestinal: Positive for nausea, vomiting and abdominal pain.  Genitourinary: Negative for dysuria and hematuria.  All other systems reviewed and are negative.   Physical Exam   Blood  pressure 123/79, pulse 86, temperature 98.6 F (37 C), temperature source Oral, resp. rate 18, height 5' 6.5" (1.689 m), weight 93.441 kg (206 lb), last menstrual period 10/11/2013.  Physical Exam  Constitutional: She is oriented to person, place, and time. She appears well-developed and well-nourished.  HENT:  Head: Normocephalic.  Mouth/Throat: Mucous membranes are dry.  Neck: Normal range of motion. Neck supple.  Cardiovascular: Normal rate, regular rhythm and normal heart sounds.   Respiratory: Effort normal and breath sounds normal.  GI: There is no tenderness.  Genitourinary: No bleeding around the vagina. No vaginal discharge found.  Neurological: She is alert and oriented to person, place, and time. She has normal reflexes.  Skin: Skin is warm and dry. She is not diaphoretic.   Results for orders placed during the hospital encounter of 11/18/13 (from the past 24 hour(s))  URINALYSIS, ROUTINE W REFLEX MICROSCOPIC     Status: Abnormal   Collection Time    11/18/13  6:55 PM      Result Value Ref Range   Color, Urine YELLOW  YELLOW   APPearance CLEAR  CLEAR   Specific Gravity, Urine 1.020  1.005 - 1.030   pH 6.5  5.0 - 8.0   Glucose, UA NEGATIVE  NEGATIVE mg/dL   Hgb urine dipstick NEGATIVE  NEGATIVE  Bilirubin Urine NEGATIVE  NEGATIVE   Ketones, ur NEGATIVE  NEGATIVE mg/dL   Protein, ur NEGATIVE  NEGATIVE mg/dL   Urobilinogen, UA 0.2  0.0 - 1.0 mg/dL   Nitrite NEGATIVE  NEGATIVE   Leukocytes, UA SMALL (*) NEGATIVE  URINE MICROSCOPIC-ADD ON     Status: Abnormal   Collection Time    11/18/13  6:55 PM      Result Value Ref Range   Squamous Epithelial / LPF FEW (*) RARE   WBC, UA 3-6  <3 WBC/hpf   RBC / HPF 3-6  <3 RBC/hpf   Bacteria, UA FEW (*) RARE   Urine-Other RARE YEAST    CBC     Status: Abnormal   Collection Time    11/18/13  7:32 PM      Result Value Ref Range   WBC 13.8 (*) 4.0 - 10.5 K/uL   RBC 4.29  3.87 - 5.11 MIL/uL   Hemoglobin 12.4  12.0 - 15.0 g/dL    HCT 57.8 (*) 46.9 - 46.0 %   MCV 83.0  78.0 - 100.0 fL   MCH 28.9  26.0 - 34.0 pg   MCHC 34.8  30.0 - 36.0 g/dL   RDW 62.9  52.8 - 41.3 %   Platelets 270  150 - 400 K/uL  HCG, QUANTITATIVE, PREGNANCY     Status: Abnormal   Collection Time    11/18/13  7:32 PM      Result Value Ref Range   hCG, Beta Chain, [place]est [address] (*) <5 mIU/mL  COMPREHENSIVE METABOLIC PANEL     Status: Abnormal   Collection Time    11/18/13  7:32 PM      Result Value Ref Range   Sodium 133 (*) 137 - 147 mEq/L   Potassium 3.7  3.7 - 5.3 mEq/L   Chloride 97  96 - 112 mEq/L   CO2 24  19 - 32 mEq/L   Glucose, Bld 92  70 - 99 mg/dL   BUN 9  6 - 23 mg/dL   Creatinine, Ser 0.27  0.50 - 1.10 mg/dL   Calcium 9.3  8.4 - 25.3 mg/dL   Total Protein 7.1  6.0 - 8.3 g/dL   Albumin 3.6  3.5 - 5.2 g/dL   AST 16  0 - 37 U/L   ALT 14  0 - 35 U/L   Alkaline Phosphatase 54  39 - 117 U/L   Total Bilirubin 0.3  0.3 - 1.2 mg/dL   GFR calc non Af Amer >90  >90 mL/min   GFR calc Af Amer >90  >90 mL/min    MAU Course  Procedures  1930 Zofran 8 mg PO given 2020 Pt reports nausea improved 2025 Tylenol ordered for headache; pelvic ultrasound ordered for IUP   Report given to J. Ethier who assumes care of patient.  [employer] 11/18/2013, 8:48 PM  Assessment and Plan    2100 - Patient in [place]. Care assumed from [place], [place]  [place] Comp Less 14 Wks  11/18/2013   CLINICAL DATA:  28 year old pregnant female with bleeding and pelvic pain. Estimated gestational 5 weeks 3 days by LMP. Beta HCG of 21,651.  EXAM: OBSTETRIC <14 WK [place] AND TRANSVAGINAL OB US  TECHNIQUE: Both transabdominal and transvaginal ultrasound examinations were performed for complete evaluation of the gestation as well as the maternal uterus, adnexal regions, and pelvic cul-de-sac. Transvaginal technique was performed to assess early pregnancy.  COMPARISON:  None.  FINDINGS: Intrauterine gestational sac: Visualized/normal  in shape.  Yolk sac:   Visualized  Embryo:  Not visualized  MSD:  18 mm    6 w   6  d       [place] EDC: 07/08/2014  Maternal uterus/adnexae:  There is no evidence of subchorionic hemorrhage.  Ovaries bilaterally are unremarkable.  No free fluid or adnexal mass.  IMPRESSION: Single intrauterine gestational sac containing yolk sac but no embryo. Estimated gestational age of [redacted] weeks 6 days by mean sac diameter. Findings are suspicious but not yet definitive for failed pregnancy given mean sac diameter of 18 mm and no embryo identified. Recommend follow-up US in 10-14 days for definitive diagnosis. This recommendation follows SRU consensus guidelines: Diagnostic Criteria for Nonviable Pregnancy Early in the First Trimester. Malva Limes Med 2013; 161:0960-45.   Electronically Signed   By: Laveda Abbe M.D.   On: 11/18/2013 21:09   US Ob Transvaginal  11/18/2013   CLINICAL DATA:  28 year old pregnant female with bleeding and pelvic pain. Estimated gestational 5 weeks 3 days by LMP. Beta HCG of 21,651.  EXAM: OBSTETRIC <14 WK [place] AND TRANSVAGINAL OB US  TECHNIQUE: Both transabdominal and transvaginal ultrasound examinations were performed for complete evaluation of the gestation as well as the maternal uterus, adnexal regions, and pelvic cul-de-sac. Transvaginal technique was performed to assess early pregnancy.  COMPARISON:  None.  FINDINGS: Intrauterine gestational sac: Visualized/normal in shape.  Yolk sac:  Visualized  Embryo:  Not visualized  MSD:  18 mm    6 w   6  d       [place] EDC: 07/08/2014  Maternal uterus/adnexae:  There is no evidence of subchorionic hemorrhage.  Ovaries bilaterally are unremarkable.  No free fluid or adnexal mass.  IMPRESSION: Single intrauterine gestational sac containing yolk sac but no embryo. Estimated gestational age of [redacted] weeks 6 days by mean sac diameter. Findings are suspicious but not yet definitive for failed pregnancy given mean sac diameter of 18 mm and no embryo identified. Recommend follow-up US in 10-14 days for  definitive diagnosis. This recommendation follows SRU consensus guidelines: Diagnostic Criteria for Nonviable Pregnancy Early in the First Trimester. Malva Limes Med 2013; 409:8119-14.   Electronically Signed   By: Laveda Abbe M.D.   On: 11/18/2013 21:09   MDM Discussed [place] results with Dr. Shawnie Pons. Order follow-up US in 10 days to confirm viability.   A: IUGS and YS at [redacted]w[redacted]d  Nausea and vomiting in pregnancy prior to [redacted] weeks gestation  P: Discharge home Rx for Zofran and Phenergan given to patient First trimester warning signs discussed Patient to follow-up for [place] to confirm viability in 10 days. They will call her with the appointment Patient may return to MAU as needed or if her condition were to change or worsen  Freddi Starr, PA-C 11/18/2013 9:03 PM

## 2013-11-19 NOTE — MAU Provider Note (Signed)
Attestation of Attending Supervision of Advanced Practitioner (PA/CNM/NP): Evaluation and management procedures were performed by the Advanced Practitioner under my supervision and collaboration.  I have reviewed the Advanced Practitioner's note and chart, and I agree with the management and plan.  Reva BoresPRATT,Iysis Germain S, MD [facility] Attending 11/19/2013 6:48 AM

## 2014-04-16 ENCOUNTER — Encounter (HOSPITAL_COMMUNITY): Payer: Self-pay | Admitting: Emergency Medicine

## 2014-04-16 ENCOUNTER — Emergency Department (HOSPITAL_COMMUNITY): Payer: No Typology Code available for payment source

## 2014-04-16 ENCOUNTER — Emergency Department (HOSPITAL_COMMUNITY)
Admission: EM | Admit: 2014-04-16 | Discharge: 2014-04-17 | Disposition: A | Discharge status: Home / Self Care | Payer: No Typology Code available for payment source | Attending: Emergency Medicine | Admitting: Emergency Medicine

## 2014-04-16 DIAGNOSIS — Z79899 Other long term (current) drug therapy: Secondary | ICD-10-CM | POA: Insufficient documentation

## 2014-04-16 DIAGNOSIS — Y92410 Unspecified street and highway as the place of occurrence of the external cause: Secondary | ICD-10-CM | POA: Insufficient documentation

## 2014-04-16 DIAGNOSIS — S199XXA Unspecified injury of neck, initial encounter: Principal | ICD-10-CM

## 2014-04-16 DIAGNOSIS — IMO0002 Reserved for concepts with insufficient information to code with codable children: Secondary | ICD-10-CM | POA: Insufficient documentation

## 2014-04-16 DIAGNOSIS — S0993XA Unspecified injury of face, initial encounter: Secondary | ICD-10-CM | POA: Insufficient documentation

## 2014-04-16 DIAGNOSIS — Y9389 Activity, other specified: Secondary | ICD-10-CM | POA: Insufficient documentation

## 2014-04-16 DIAGNOSIS — Z792 Long term (current) use of antibiotics: Secondary | ICD-10-CM | POA: Insufficient documentation

## 2014-04-16 DIAGNOSIS — S46909A Unspecified injury of unspecified muscle, fascia and tendon at shoulder and upper arm level, unspecified arm, initial encounter: Secondary | ICD-10-CM | POA: Insufficient documentation

## 2014-04-16 DIAGNOSIS — Z8619 Personal history of other infectious and parasitic diseases: Secondary | ICD-10-CM | POA: Insufficient documentation

## 2014-04-16 DIAGNOSIS — F17200 Nicotine dependence, unspecified, uncomplicated: Secondary | ICD-10-CM | POA: Insufficient documentation

## 2014-04-16 DIAGNOSIS — S4980XA Other specified injuries of shoulder and upper arm, unspecified arm, initial encounter: Secondary | ICD-10-CM | POA: Insufficient documentation

## 2014-04-16 MED ORDER — IBUPROFEN 400 MG PO TABS
800.0000 mg | ORAL_TABLET | Freq: Once | ORAL | Status: AC
  Administered 2014-04-17: 800 mg via ORAL
  Filled 2014-04-16: qty 2
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
## 2014-04-16 NOTE — ED Notes (Signed)
C-collar applied

## 2014-04-17 MED ORDER — METHOCARBAMOL 500 MG PO TABS: 500.0000 mg | ORAL_TABLET | Freq: Two times a day (BID) | ORAL | Status: DC

## 2014-04-17 MED ORDER — IBUPROFEN 800 MG PO TABS: 800.0000 mg | ORAL_TABLET | Freq: Three times a day (TID) | ORAL | Status: DC

## 2014-04-17 NOTE — ED Provider Notes (Signed)
Medical screening examination/treatment/procedure(s) were performed by non-physician practitioner and as supervising physician I was immediately available for consultation/collaboration.   EKG Interpretation None        Shaquala Broeker, MD 04/17/14 0145 

## 2014-04-17 NOTE — Discharge Instructions (Signed)

## 2014-07-13 ENCOUNTER — Encounter (HOSPITAL_COMMUNITY): Payer: Self-pay | Admitting: Emergency Medicine

## 2014-07-13 ENCOUNTER — Emergency Department (HOSPITAL_COMMUNITY): Payer: No Typology Code available for payment source

## 2014-07-13 ENCOUNTER — Emergency Department (HOSPITAL_COMMUNITY)
Admission: EM | Admit: 2014-07-13 | Discharge: 2014-07-13 | Disposition: A | Discharge status: Home / Self Care | Payer: No Typology Code available for payment source | Attending: Emergency Medicine | Admitting: Emergency Medicine

## 2014-07-13 DIAGNOSIS — I10 Essential (primary) hypertension: Secondary | ICD-10-CM | POA: Insufficient documentation

## 2014-07-13 DIAGNOSIS — Z720 Tobacco use: Secondary | ICD-10-CM | POA: Insufficient documentation

## 2014-07-13 DIAGNOSIS — Z8619 Personal history of other infectious and parasitic diseases: Secondary | ICD-10-CM | POA: Insufficient documentation

## 2014-07-13 DIAGNOSIS — Z79899 Other long term (current) drug therapy: Secondary | ICD-10-CM | POA: Insufficient documentation

## 2014-07-13 DIAGNOSIS — R0602 Shortness of breath: Secondary | ICD-10-CM | POA: Insufficient documentation

## 2014-07-13 DIAGNOSIS — J209 Acute bronchitis, unspecified: Secondary | ICD-10-CM | POA: Insufficient documentation

## 2014-07-13 DIAGNOSIS — R1110 Vomiting, unspecified: Secondary | ICD-10-CM | POA: Insufficient documentation

## 2014-07-13 DIAGNOSIS — J40 Bronchitis, not specified as acute or chronic: Secondary | ICD-10-CM

## 2014-07-13 DIAGNOSIS — Z3202 Encounter for pregnancy test, result negative: Secondary | ICD-10-CM | POA: Insufficient documentation

## 2014-07-13 LAB — PREGNANCY, URINE: Preg Test, Ur: NEGATIVE

## 2014-07-13 MED ORDER — AZITHROMYCIN 250 MG PO TABS: ORAL_TABLET | ORAL | Status: DC

## 2014-07-13 MED ORDER — ONDANSETRON 8 MG PO TBDP
8.0000 mg | ORAL_TABLET | Freq: Once | ORAL | Status: AC
  Administered 2014-07-13: 8 mg via ORAL
  Filled 2014-07-13: qty 1

## 2014-07-13 MED ORDER — BENZONATATE 100 MG PO CAPS: 100.0000 mg | ORAL_CAPSULE | Freq: Three times a day (TID) | ORAL | Status: DC

## 2014-07-13 NOTE — ED Provider Notes (Signed)
Medical screening examination/treatment/procedure(s) were performed by non-physician practitioner and as supervising physician I was immediately available for consultation/collaboration.   EKG Interpretation None        Aunesty Tyson F Agusta Hackenberg, MD 07/13/14 1902 

## 2014-07-13 NOTE — ED Provider Notes (Signed)
CSN: [id number]     Arrival date & time 07/13/14  0908 History   First MD Initiated Contact with Patient 07/13/14 [phone]     Chief Complaint  Patient presents with  . Cough  . Emesis     (Consider location/radiation/quality/duration/timing/severity/associated sxs/prior Treatment) Patient is a 28 y.o. female presenting with cough and vomiting. The history is provided by the patient. No language interpreter was used.  Cough Cough characteristics:  Productive Sputum characteristics:  Green Severity:  Moderate Onset quality:  Gradual Duration:  3 weeks Timing:  Constant Progression:  Worsening Chronicity:  New Smoker: no   Context: upper respiratory infection   Relieved by:  Nothing Worsened by:  Nothing tried Ineffective treatments:  None tried Associated symptoms: shortness of breath   Risk factors: recent infection   Emesis   Past Medical History  Diagnosis Date  . Gonorrhea   . Chlamydia contact, treated   . Headache(784.0)   . Hypertension     no meds; dx 2008   Past Surgical History  Procedure Laterality Date  . Dilation and curettage of uterus    . Induced abortion     Family History  Problem Relation Age of Onset  . Anesthesia problems Neg Hx   . Hypertension Mother   . Diabetes Brother   . Hypertension Maternal Aunt   . Cancer Maternal Grandmother   . Hypertension Maternal Grandmother    History  Substance Use Topics  . Smoking status: Current Some Day Smoker  . Smokeless tobacco: Never Used  . Alcohol Use: 3.3 oz/week    3 Shots of liquor, 3 Drinks containing 0.5 oz of alcohol per week     Comment: occasional alcohol   OB History   Grav Para Term Preterm Abortions TAB SAB Ect Mult Living   4 2 2  1 1    2      Review of Systems  Respiratory: Positive for cough and shortness of breath.   Gastrointestinal: Positive for vomiting.  All other systems reviewed and are negative.     Allergies  Aspirin  Home Medications   Prior to Admission  medications   Medication Sig Start Date End Date Taking? Authorizing Provider  etonogestrel (NEXPLANON) 68 MG IMPL implant 1 each by Subdermal route once. Replace every 3 years, just got in March 2015   Yes Historical Provider, MD  ibuprofen (ADVIL,MOTRIN) 800 MG tablet Take 1 tablet (800 mg total) by mouth 3 (three) times daily. 04/17/14  Yes Fayrene HelperBowie Tran, PA-C  methocarbamol (ROBAXIN) 500 MG tablet Take 1 tablet (500 mg total) by mouth 2 (two) times daily. 04/17/14  Yes Fayrene HelperBowie Tran, PA-C   BP 138/85  Pulse 111  Temp(Src) 98.3 F (36.8 C) (Oral)  Resp 20  SpO2 96%  LMP 10/11/2013 Physical Exam  Nursing note and vitals reviewed. Constitutional: She appears well-developed and well-nourished.  HENT:  Head: Normocephalic.  Right Ear: External ear normal.  Left Ear: External ear normal.  Eyes: Conjunctivae are normal. Pupils are equal, round, and reactive to light.  Neck: Normal range of motion.  Cardiovascular: Normal rate and normal heart sounds.   Pulmonary/Chest: Effort normal and breath sounds normal.  Abdominal: Soft.  Musculoskeletal: Normal range of motion.  Neurological: She is alert.  Skin: Skin is warm.  Psychiatric: She has a normal mood and affect.    ED Course  Procedures (including critical care time) Labs Review Labs Reviewed - No data to display  Imaging Review Dg Chest 2 View  07/13/2014  CLINICAL DATA:  Headaches. Emesis. Productive cough. Tobacco use. Hypertension.  EXAM: CHEST  2 VIEW  COMPARISON:  10/04/2004  FINDINGS: The heart size and mediastinal contours are within normal limits. Both lungs are clear. The visualized skeletal structures are unremarkable.  IMPRESSION: No active cardiopulmonary disease.   Electronically Signed   By: Herbie BaltimoreWalt  Liebkemann M.D.   On: 07/13/2014 11:17     EKG Interpretation None      MDM   Final diagnoses:  Bronchitis    tessaon perles zithromax AVS Follow up with Dr. Concepcion ElkAvbuere for recheck in 1 week     Elson AreasLeslie K  Anne-Marie Genson, PA-C 07/13/14 1247

## 2014-07-13 NOTE — ED Notes (Signed)
Pt reports feeling unwell for the past 3 weeks. Began to have headaches 3 days ago and emesis 2 days ago. Has been able to keep down ice and medications until this am. Pt reports coughing up green phlegm. No diarrhea.

## 2014-07-13 NOTE — Discharge Instructions (Signed)

## 2014-07-27 ENCOUNTER — Encounter (HOSPITAL_COMMUNITY): Payer: Self-pay | Admitting: Emergency Medicine

## 2015-05-22 ENCOUNTER — Encounter (HOSPITAL_COMMUNITY): Payer: Self-pay | Admitting: *Deleted

## 2015-05-22 ENCOUNTER — Inpatient Hospital Stay (HOSPITAL_COMMUNITY)
Admission: AD | Admit: 2015-05-22 | Discharge: 2015-05-22 | Disposition: A | Discharge status: Home / Self Care | Payer: Self-pay | Source: Ambulatory Visit | Attending: Obstetrics and Gynecology | Admitting: Obstetrics and Gynecology

## 2015-05-22 DIAGNOSIS — F17210 Nicotine dependence, cigarettes, uncomplicated: Secondary | ICD-10-CM | POA: Insufficient documentation

## 2015-05-22 DIAGNOSIS — N898 Other specified noninflammatory disorders of vagina: Secondary | ICD-10-CM

## 2015-05-22 DIAGNOSIS — I10 Essential (primary) hypertension: Secondary | ICD-10-CM | POA: Insufficient documentation

## 2015-05-22 DIAGNOSIS — R51 Headache: Secondary | ICD-10-CM | POA: Insufficient documentation

## 2015-05-22 DIAGNOSIS — N72 Inflammatory disease of cervix uteri: Secondary | ICD-10-CM

## 2015-05-22 DIAGNOSIS — Z886 Allergy status to analgesic agent status: Secondary | ICD-10-CM | POA: Insufficient documentation

## 2015-05-22 LAB — URINALYSIS, ROUTINE W REFLEX MICROSCOPIC
Bilirubin Urine: NEGATIVE
Glucose, UA: NEGATIVE mg/dL
Ketones, ur: NEGATIVE mg/dL
LEUKOCYTES UA: NEGATIVE
NITRITE: NEGATIVE
PROTEIN: NEGATIVE mg/dL
SPECIFIC GRAVITY, URINE: 1.025 (ref 1.005–1.030)
UROBILINOGEN UA: 0.2 mg/dL (ref 0.0–1.0)
pH: 6 (ref 5.0–8.0)

## 2015-05-22 LAB — URINE MICROSCOPIC-ADD ON

## 2015-05-22 LAB — WET PREP, GENITAL
CLUE CELLS WET PREP: NONE SEEN
TRICH WET PREP: NONE SEEN
YEAST WET PREP: NONE SEEN

## 2015-05-22 LAB — POCT PREGNANCY, URINE: PREG TEST UR: NEGATIVE

## 2015-05-22 MED ORDER — AZITHROMYCIN 250 MG PO TABS
1000.0000 mg | ORAL_TABLET | Freq: Once | ORAL | Status: AC
  Administered 2015-05-22: 1000 mg via ORAL
  Filled 2015-05-22: qty 4

## 2015-05-22 MED ORDER — FLUCONAZOLE 150 MG PO TABS: 150.0000 mg | ORAL_TABLET | Freq: Once | ORAL | Status: DC

## 2015-05-22 MED ORDER — CEFTRIAXONE SODIUM 250 MG IJ SOLR
250.0000 mg | Freq: Once | INTRAMUSCULAR | Status: AC
  Administered 2015-05-22: 250 mg via INTRAMUSCULAR
  Filled 2015-05-22: qty 250

## 2015-05-22 NOTE — MAU Provider Note (Signed)
History     CSN: [id number]  Arrival date and time: 05/22/15 4098   First Provider Initiated Contact with Patient 05/22/15 1931      Chief Complaint  Patient presents with  . Vaginal Discharge   HPI Pt is not pregnant - has Nexplanon- presents for concern of vaginal infection, possible STD. Pt has had a vaginal discharge with itching and odor for about 1 week. Pt also c/o pressure with urination. Pt denies fever, chills, constipation or diarrhea. Pt has mild abdominal cramping. Pt has had some nausea associated with Migraine headache RN note:  Signed  MAU Note 05/22/2015 6:51 PM    Expand All Collapse All   Pt states was celibate for past 13 months boyfriend was in jail. Had sex 3 weeks ago, then found out he was with another person. States vagina has felt weird since then. Notes vaginal odor, whitish discharge, and itching at times. Also feels pressure with voiding.         Past Medical History  Diagnosis Date  . Gonorrhea   . Chlamydia contact, treated   . Headache(784.0)   . Hypertension     no meds; dx 2008    Past Surgical History  Procedure Laterality Date  . Dilation and curettage of uterus    . Induced abortion      Family History  Problem Relation Age of Onset  . Anesthesia problems Neg Hx   . Hypertension Mother   . Diabetes Brother   . Hypertension Maternal Aunt   . Cancer Maternal Grandmother   . Hypertension Maternal Grandmother     Social History  Substance Use Topics  . Smoking status: Current Some Day Smoker  . Smokeless tobacco: Never Used  . Alcohol Use: 3.3 oz/week    3 Shots of liquor, 3 Standard drinks or equivalent per week     Comment: occasional alcohol    Allergies:  Allergies  Allergen Reactions  . Aspirin Itching    Prescriptions prior to admission  Medication Sig Dispense Refill Last Dose  . azithromycin (ZITHROMAX Z-PAK) 250 MG tablet Two tablets po 1st day then one a day for 4 days 6 tablet 0   . benzonatate  (TESSALON) 100 MG capsule Take 1 capsule (100 mg total) by mouth every 8 (eight) hours. 21 capsule 0   . etonogestrel (NEXPLANON) 68 MG IMPL implant 1 each by Subdermal route once. Replace every 3 years, just got in March 2015   07/13/2014 at Unknown time  . ibuprofen (ADVIL,MOTRIN) 800 MG tablet Take 1 tablet (800 mg total) by mouth 3 (three) times daily. 21 tablet 0 Past Month at Unknown time  . methocarbamol (ROBAXIN) 500 MG tablet Take 1 tablet (500 mg total) by mouth 2 (two) times daily. 20 tablet 0 Past Week at Unknown time    Review of Systems  Constitutional: Negative for fever and chills.  Gastrointestinal: Negative for nausea, vomiting, abdominal pain, diarrhea and constipation.  Genitourinary: Negative for dysuria, urgency and frequency.  Neurological: Positive for headaches.   Physical Exam   Blood pressure 141/73, pulse 83, temperature 98.3 F (36.8 C), temperature source Oral, resp. rate 18, height 5\' 6"  (1.676 m), weight 208 lb (94.348 kg), last menstrual period 04/29/2015, not currently breastfeeding.  Physical Exam  Nursing note and vitals reviewed. Constitutional: She is oriented to person, place, and time. She appears well-developed and well-nourished. No distress.  HENT:  Head: Normocephalic.  Eyes: Pupils are equal, round, and reactive to light.  Neck: Normal  range of motion. Neck supple.  Cardiovascular: Normal rate.   Respiratory: Effort normal.  GI: Soft. She exhibits no distension. There is no tenderness. There is no rebound and no guarding.  Genitourinary:  Small amount of watery cream colored discharge in vault; cervix clean, tender with palpation Diffuse tenderness with Cervical motion Difficulty appreciating uterine size and adnexa due to habitus   Musculoskeletal: Normal range of motion.  Neurological: She is alert and oriented to person, place, and time.  Skin: Skin is warm and dry.  Psychiatric: She has a normal mood and affect. Her behavior is  normal.    MAU Course  Procedures Results for orders placed or performed during the hospital encounter of 05/22/15 (from the past 24 hour(s))  Pregnancy, urine POC     Status: None   Collection Time: 05/22/15  7:08 PM  Result Value Ref Range   Preg Test, Ur NEGATIVE NEGATIVE  Urinalysis, Routine w reflex microscopic (not at [facility])     Status: Abnormal   Collection Time: 05/22/15  7:40 PM  Result Value Ref Range   Color, Urine YELLOW YELLOW   APPearance CLEAR CLEAR   Specific Gravity, Urine 1.025 1.005 - 1.030   pH 6.0 5.0 - 8.0   Glucose, UA NEGATIVE NEGATIVE mg/dL   Hgb urine dipstick SMALL (A) NEGATIVE   Bilirubin Urine NEGATIVE NEGATIVE   Ketones, ur NEGATIVE NEGATIVE mg/dL   Protein, ur NEGATIVE NEGATIVE mg/dL   Urobilinogen, UA 0.2 0.0 - 1.0 mg/dL   Nitrite NEGATIVE NEGATIVE   Leukocytes, UA NEGATIVE NEGATIVE  Urine microscopic-add on     Status: None   Collection Time: 05/22/15  7:40 PM  Result Value Ref Range   Squamous Epithelial / LPF RARE RARE   WBC, UA 0-2 <3 WBC/hpf   RBC / HPF 0-2 <3 RBC/hpf   Bacteria, UA RARE RARE   Urine-Other MUCOUS PRESENT   Wet prep, genital     Status: Abnormal   Collection Time: 05/22/15  8:00 PM  Result Value Ref Range   Yeast Wet Prep HPF POC NONE SEEN NONE SEEN   Trich, Wet Prep NONE SEEN NONE SEEN   Clue Cells Wet Prep HPF POC NONE SEEN NONE SEEN   WBC, Wet Prep HPF POC FEW (A) NONE SEEN  GC/chlamydia pending Treated for concern Chlamydia/GC exposure with Rocephin250mg   and Zithromax  Assessment and Plan  Vaginal irritation- Diflucan  RX #2 Cervicitis treated with rocephin  and zithromax 1 gm in MAU Chlamydia and GC/chlamydia cultures pending Headaches- recommend PCP- [facility] recommended   [facility] 05/22/2015, 7:32 PM

## 2015-05-22 NOTE — MAU Note (Signed)
Pt states was celibate for past 13 months boyfriend was in jail. Had sex 3 weeks ago, then found out he was with another person. States vagina has felt weird since then. Notes vaginal odor, whitish discharge, and itching at times. Also feels pressure with voiding.

## 2015-05-23 LAB — RPR: RPR Ser Ql: NONREACTIVE

## 2015-05-23 LAB — HIV ANTIBODY (ROUTINE TESTING W REFLEX): HIV SCREEN 4TH GENERATION: NONREACTIVE

## 2015-05-24 LAB — GC/CHLAMYDIA PROBE AMP (~~LOC~~) NOT AT ARMC
Chlamydia: NEGATIVE
NEISSERIA GONORRHEA: NEGATIVE

## 2015-10-19 ENCOUNTER — Inpatient Hospital Stay (HOSPITAL_COMMUNITY)
Admission: AD | Admit: 2015-10-19 | Discharge: 2015-10-19 | Disposition: A | Discharge status: Home / Self Care | Payer: Self-pay | Source: Ambulatory Visit | Attending: Obstetrics and Gynecology | Admitting: Obstetrics and Gynecology

## 2015-10-19 ENCOUNTER — Emergency Department (HOSPITAL_COMMUNITY)
Admission: EM | Admit: 2015-10-19 | Discharge: 2015-10-19 | Discharge status: Against Medical Advice | Payer: Self-pay | Attending: Emergency Medicine | Admitting: Emergency Medicine

## 2015-10-19 ENCOUNTER — Encounter (HOSPITAL_COMMUNITY): Payer: Self-pay

## 2015-10-19 DIAGNOSIS — R1110 Vomiting, unspecified: Secondary | ICD-10-CM | POA: Insufficient documentation

## 2015-10-19 DIAGNOSIS — Z87891 Personal history of nicotine dependence: Secondary | ICD-10-CM | POA: Insufficient documentation

## 2015-10-19 DIAGNOSIS — K529 Noninfective gastroenteritis and colitis, unspecified: Secondary | ICD-10-CM | POA: Insufficient documentation

## 2015-10-19 DIAGNOSIS — Z3202 Encounter for pregnancy test, result negative: Secondary | ICD-10-CM | POA: Insufficient documentation

## 2015-10-19 DIAGNOSIS — R109 Unspecified abdominal pain: Secondary | ICD-10-CM | POA: Insufficient documentation

## 2015-10-19 DIAGNOSIS — Z7982 Long term (current) use of aspirin: Secondary | ICD-10-CM | POA: Insufficient documentation

## 2015-10-19 DIAGNOSIS — I10 Essential (primary) hypertension: Secondary | ICD-10-CM | POA: Insufficient documentation

## 2015-10-19 DIAGNOSIS — A09 Infectious gastroenteritis and colitis, unspecified: Secondary | ICD-10-CM

## 2015-10-19 LAB — URINALYSIS, ROUTINE W REFLEX MICROSCOPIC
GLUCOSE, UA: NEGATIVE mg/dL
Ketones, ur: 15 mg/dL — AB
Leukocytes, UA: NEGATIVE
NITRITE: NEGATIVE
PH: 6 (ref 5.0–8.0)
Protein, ur: 30 mg/dL — AB

## 2015-10-19 LAB — URINE MICROSCOPIC-ADD ON: WBC UA: NONE SEEN WBC/hpf (ref 0–5)

## 2015-10-19 LAB — CBC
HEMATOCRIT: 40.3 % (ref 36.0–46.0)
HEMOGLOBIN: 13 g/dL (ref 12.0–15.0)
MCH: 28.9 pg (ref 26.0–34.0)
MCHC: 32.3 g/dL (ref 30.0–36.0)
MCV: 89.6 fL (ref 78.0–100.0)
Platelets: 322 10*3/uL (ref 150–400)
RBC: 4.5 MIL/uL (ref 3.87–5.11)
RDW: 13.3 % (ref 11.5–15.5)
WBC: 10.8 10*3/uL — ABNORMAL HIGH (ref 4.0–10.5)

## 2015-10-19 LAB — COMPREHENSIVE METABOLIC PANEL
ALBUMIN: 4.2 g/dL (ref 3.5–5.0)
ALT: 14 U/L (ref 14–54)
ANION GAP: 9 (ref 5–15)
AST: 18 U/L (ref 15–41)
Alkaline Phosphatase: 62 U/L (ref 38–126)
BILIRUBIN TOTAL: 0.9 mg/dL (ref 0.3–1.2)
BUN: 11 mg/dL (ref 6–20)
CHLORIDE: 106 mmol/L (ref 101–111)
CO2: 27 mmol/L (ref 22–32)
Calcium: 9.5 mg/dL (ref 8.9–10.3)
Creatinine, Ser: 0.83 mg/dL (ref 0.44–1.00)
GFR calc Af Amer: >60 mL/min (ref >60)
GFR calc non Af Amer: >60 mL/min (ref >60)
GLUCOSE: 93 mg/dL (ref 65–99)
POTASSIUM: 3.4 mmol/L — AB (ref 3.5–5.1)
SODIUM: 142 mmol/L (ref 135–145)
TOTAL PROTEIN: 7.9 g/dL (ref 6.5–8.1)

## 2015-10-19 LAB — I-STAT BETA HCG BLOOD, ED (MC, WL, AP ONLY)

## 2015-10-19 LAB — LIPASE, BLOOD: LIPASE: 20 U/L (ref 11–51)

## 2015-10-19 MED ORDER — PROMETHAZINE HCL 25 MG PO TABS: 25.0000 mg | ORAL_TABLET | Freq: Four times a day (QID) | ORAL | Status: DC | PRN

## 2015-10-19 MED ORDER — LACTATED RINGERS IV BOLUS (SEPSIS)
1000.0000 mL | Freq: Once | INTRAVENOUS | Status: AC
  Administered 2015-10-19: 1000 mL via INTRAVENOUS

## 2015-10-19 MED ORDER — SODIUM CHLORIDE 0.9 % IV SOLN
8.0000 mg | Freq: Once | INTRAVENOUS | Status: AC
  Administered 2015-10-19: 8 mg via INTRAVENOUS
  Filled 2015-10-19: qty 4

## 2015-10-19 NOTE — MAU Provider Note (Signed)
Chief Complaint: Nausea; Emesis; Abdominal Pain; and Dizziness   First Provider Initiated Contact with Patient 10/19/15 1841      SUBJECTIVE HPI: Erica Mack is a 30 y.o. [phone] non-pregnant female who presents to Maternity Admissions reporting nausea, vomiting x 3, and lightheadedness since this morning. Also reports pain across low abd that she attributes to straining w/ vomiting. States she went to [employer] and waited 4 hours w/out being seen so she left and came here for evaluation. Concerned about pregnancy because she had Nexplanon removed in December 2016 and has not taking been taking OCPs in schedule, but [facility]Stat quant was neg at [facility] today. CBC CMET drawn, essentailly neg. Patient's last menstrual period was 10/17/2015 (exact date).  Was seen at Health Dept in December at pelvic exam w/ cultures. Declines repeat.   Location: low abd Quality: pulling Severity: Mild Duration: >24 hours (although had cramping over the past month) Context: w/ vomiting Timing: intermittent Modifying factors: worse w/ vomiting Associated signs and symptoms: Pos for possible chills. Neg for fever, diarrhea, sick contacts. Loss of appetite. Vaginal discharge. VB.   Past Medical History  Diagnosis Date  . Gonorrhea   . Chlamydia contact, treated   . Headache(784.0)   . Hypertension     no meds; dx 2008   OB History  Gravida Para Term Preterm AB SAB TAB Ectopic Multiple Living  # Outcome Date GA Lbr Len/2nd Weight Sex Delivery Anes PTL Lv  4 Term 09/05/09 [redacted]w[redacted]d  6 lb 4 oz (2.835 kg) F Vag-Spont EPI  Y     Comments: hyperemesis   3 Term 10/25/07 [redacted]w[redacted]d  4 lb 11 oz (2.126 kg) M Vag-Spont EPI  Y     Comments: No complications  2 Gravida              Comments: System Generated. Please review and update pregnancy details.  1 TAB              Past Surgical History  Procedure Laterality Date  . Dilation and curettage of uterus    . Induced abortion     Social History    Social History  . Marital Status: Single    Spouse Name: N/A  . Number of Children: N/A  . Years of Education: N/A   Occupational History  . Not on file.   Social History Main Topics  . Smoking status: Former Games developer  . Smokeless tobacco: Never Used  . Alcohol Use: 3.3 oz/week    3 Shots of liquor, 3 Standard drinks or equivalent per week     Comment: occasional alcohol  . Drug Use: No  . Sexual Activity: Yes    Birth Control/ Protection: OCPs        Other Topics Concern  . Not on file   Social History Narrative   No current facility-administered medications on file prior to encounter.   Current Outpatient Prescriptions on File Prior to Encounter  Medication Sig Dispense Refill  . aspirin-acetaminophen-caffeine (EXCEDRIN MIGRAINE) 250-250-65 MG per tablet Take 2 tablets by mouth every 6 (six) hours as needed for headache.    . etonogestrel (NEXPLANON) 68 MG IMPL implant 1 each by Subdermal route once. Replace every 3 years.  Pt just got in March of 2015.    . fluconazole (DIFLUCAN) 150 MG tablet Take 1 tablet (150 mg total) by mouth once. 2 tablet 3   Allergies  Allergen Reactions  .  Aspirin Itching    I have reviewed the past Medical Hx, Surgical Hx, Social Hx, Allergies and Medications.   Review of Systems  Constitutional: Positive for chills. Negative for fever and appetite change.  Gastrointestinal: Positive for nausea, vomiting and abdominal pain. Negative for diarrhea, constipation and abdominal distention.  Genitourinary: Negative for dysuria, urgency, frequency, hematuria, flank pain, vaginal bleeding, vaginal discharge, vaginal pain and pelvic pain.  Neurological: Positive for light-headedness. Negative for dizziness and syncope.    OBJECTIVE Patient Vitals for the past 24 hrs:  BP Temp Pulse Resp  10/19/15 1734 (!) 113/54 mmHg 98.5 F (36.9 C) 80 18   Constitutional: Well-developed, well-nourished female in no acute distress.  Head: mucus membranes  moist. Cardiovascular: normal rate Respiratory: normal rate and effort.  GI: Abd soft, non-tender. Neg mass, rebound tenderness or guarding. Pos BS x 4 Neurologic: Alert and oriented x 4.  GU: Neg CVAT.  SPECULUM EXAM: Declined  LAB RESULTS Results for orders placed or performed during the hospital encounter of 10/19/15 (from the past 24 hour(s))  Lipase, blood     Status: None   Collection Time: 10/19/15  2:20 PM  Result Value Ref Range   Lipase 20 11 - 51 U/L  Comprehensive metabolic panel     Status: Abnormal   Collection Time: 10/19/15  2:20 PM  Result Value Ref Range   Sodium 142 135 - 145 mmol/L   Potassium 3.4 (L) 3.5 - 5.1 mmol/L   Chloride 106 101 - 111 mmol/L   CO2 27 22 - 32 mmol/L   Glucose, Bld 93 65 - 99 mg/dL   BUN 11 6 - 20 mg/dL   Creatinine, Ser 1.61 0.44 - 1.00 mg/dL   Calcium 9.5 8.9 - 09.6 mg/dL   Total Protein 7.9 6.5 - 8.1 g/dL   Albumin 4.2 3.5 - 5.0 g/dL   AST 18 15 - 41 U/L   ALT 14 14 - 54 U/L   Alkaline Phosphatase 62 38 - 126 U/L   Total Bilirubin 0.9 0.3 - 1.2 mg/dL   GFR calc non Af Amer >60 >60 mL/min   GFR calc Af Amer >60 >60 mL/min   Anion gap 9 5 - 15  CBC     Status: Abnormal   Collection Time: 10/19/15  2:20 PM  Result Value Ref Range   WBC 10.8 (H) 4.0 - 10.5 K/uL   RBC 4.50 3.87 - 5.11 MIL/uL   Hemoglobin 13.0 12.0 - 15.0 g/dL   HCT 04.5 40.9 - 81.1 %   MCV 89.6 78.0 - 100.0 fL   MCH 28.9 26.0 - 34.0 pg   MCHC 32.3 30.0 - 36.0 g/dL   RDW 91.4 78.2 - 95.6 %   Platelets 322 150 - 400 K/uL  I-Stat beta hCG blood, ED ([facility], [facility], [facility] only)     Status: None   Collection Time: 10/19/15  2:29 PM  Result Value Ref Range   I-stat hCG, quantitative <5.0 <5 mIU/mL   Comment 3            IMAGING No results found.  MAU COURSE UA, LR bolus, Zofran.  MDM 30 year old nonpregnant female with nausea and vomiting less than 24 hours consistent with infectious gastroenteritis. Tolerating POs. low suspicion for appendicitis due to absence of  fever and leukocytosis and location of pain and absence of abdominal tenderness.  ASSESSMENT 1. Gastroenteritis presumed infectious   2. Pregnancy test negative    PLAN Discharge home in stable condition. Return to [facility]  or [facility] ED if unable to keep anything down for greater than 24 hours.   take birth control pills daily on schedule to avoid failure contraceptive method or irregular menstrual cycle. Wants to discuss other contraceptive options at health department.  Follow-up Information    Follow up with Dorrene German, MD.   Specialty:  Internal Medicine   Why:  As needed if symptoms worsen   Contact information:   [address] [place] [address] [phone]        Medication List    STOP taking these medications        fluconazole 150 MG tablet  Commonly known as:  DIFLUCAN      TAKE these medications        albuterol 108 (90 Base) MCG/ACT inhaler  Commonly known as:  PROVENTIL HFA;VENTOLIN HFA  Inhale 3 puffs into the lungs every 6 (six) hours as needed for wheezing or shortness of breath.     aspirin-acetaminophen-caffeine 250-250-65 MG tablet  Commonly known as:  EXCEDRIN MIGRAINE  Take 2 tablets by mouth every 6 (six) hours as needed for headache.     promethazine 25 MG tablet  Commonly known as:  PHENERGAN  Take 1 tablet (25 mg total) by mouth every 6 (six) hours as needed.       [place], [place] 10/19/2015  5:37 PM

## 2015-10-19 NOTE — ED Notes (Signed)
Patient c/o vomiting and abdominal pain since 0100 today. Patient denies fever and diarrhea.

## 2015-10-19 NOTE — Discharge Instructions (Signed)

## 2015-10-19 NOTE — MAU Note (Signed)
Pt presents to MAU with complaints of nausea, vomiting, abdominal pain, and lightheadedness since this morning. PT states she went to [facility] and it was an 8 hour wait so she left and came here for evaluation

## 2016-05-22 ENCOUNTER — Inpatient Hospital Stay (HOSPITAL_COMMUNITY): Payer: Medicaid Other

## 2016-05-22 ENCOUNTER — Inpatient Hospital Stay (HOSPITAL_COMMUNITY)
Admission: AD | Admit: 2016-05-22 | Discharge: 2016-05-22 | Disposition: A | Discharge status: Home / Self Care | Payer: Medicaid Other | Source: Ambulatory Visit | Attending: Family Medicine | Admitting: Family Medicine

## 2016-05-22 ENCOUNTER — Encounter (HOSPITAL_COMMUNITY): Payer: Self-pay | Admitting: *Deleted

## 2016-05-22 DIAGNOSIS — Z3201 Encounter for pregnancy test, result positive: Secondary | ICD-10-CM | POA: Insufficient documentation

## 2016-05-22 DIAGNOSIS — R42 Dizziness and giddiness: Secondary | ICD-10-CM | POA: Diagnosis present

## 2016-05-22 DIAGNOSIS — O26891 Other specified pregnancy related conditions, first trimester: Secondary | ICD-10-CM

## 2016-05-22 DIAGNOSIS — R109 Unspecified abdominal pain: Secondary | ICD-10-CM | POA: Diagnosis not present

## 2016-05-22 DIAGNOSIS — R112 Nausea with vomiting, unspecified: Secondary | ICD-10-CM | POA: Diagnosis present

## 2016-05-22 DIAGNOSIS — R51 Headache: Secondary | ICD-10-CM | POA: Insufficient documentation

## 2016-05-22 DIAGNOSIS — O219 Vomiting of pregnancy, unspecified: Secondary | ICD-10-CM | POA: Diagnosis not present

## 2016-05-22 DIAGNOSIS — R519 Headache, unspecified: Secondary | ICD-10-CM

## 2016-05-22 DIAGNOSIS — O26899 Other specified pregnancy related conditions, unspecified trimester: Secondary | ICD-10-CM

## 2016-05-22 DIAGNOSIS — O9989 Other specified diseases and conditions complicating pregnancy, childbirth and the puerperium: Secondary | ICD-10-CM | POA: Diagnosis not present

## 2016-05-22 DIAGNOSIS — O3680X0 Pregnancy with inconclusive fetal viability, not applicable or unspecified: Secondary | ICD-10-CM

## 2016-05-22 LAB — URINALYSIS, ROUTINE W REFLEX MICROSCOPIC
BILIRUBIN URINE: NEGATIVE
GLUCOSE, UA: NEGATIVE mg/dL
Ketones, ur: NEGATIVE mg/dL
Leukocytes, UA: NEGATIVE
Nitrite: NEGATIVE
PH: 6 (ref 5.0–8.0)
Protein, ur: NEGATIVE mg/dL
SPECIFIC GRAVITY, URINE: 1.02 (ref 1.005–1.030)

## 2016-05-22 LAB — OB RESULTS CONSOLE GC/CHLAMYDIA: Gonorrhea: NEGATIVE

## 2016-05-22 LAB — CBC
HEMATOCRIT: 35.9 % — AB (ref 36.0–46.0)
Hemoglobin: 12.5 g/dL (ref 12.0–15.0)
MCH: 29.1 pg (ref 26.0–34.0)
MCHC: 34.8 g/dL (ref 30.0–36.0)
MCV: 83.5 fL (ref 78.0–100.0)
Platelets: 273 10*3/uL (ref 150–400)
RBC: 4.3 MIL/uL (ref 3.87–5.11)
RDW: 13.6 % (ref 11.5–15.5)
WBC: 12 10*3/uL — ABNORMAL HIGH (ref 4.0–10.5)

## 2016-05-22 LAB — URINE MICROSCOPIC-ADD ON
Bacteria, UA: NONE SEEN
WBC UA: NONE SEEN WBC/hpf (ref 0–5)

## 2016-05-22 LAB — ABO/RH: ABO/RH(D): B POS

## 2016-05-22 LAB — WET PREP, GENITAL
Clue Cells Wet Prep HPF POC: NONE SEEN
SPERM: NONE SEEN
Trich, Wet Prep: NONE SEEN
Yeast Wet Prep HPF POC: NONE SEEN

## 2016-05-22 LAB — HCG, QUANTITATIVE, PREGNANCY: hCG, Beta Chain, Quant, S: 472 m[IU]/mL — ABNORMAL HIGH (ref <5)

## 2016-05-22 LAB — POCT PREGNANCY, URINE: Preg Test, Ur: POSITIVE — AB

## 2016-05-22 MED ORDER — PROMETHAZINE HCL 25 MG PO TABS
25.0000 mg | ORAL_TABLET | Freq: Once | ORAL | Status: AC
  Administered 2016-05-22: 25 mg via ORAL
  Filled 2016-05-22: qty 1

## 2016-05-22 MED ORDER — ACETAMINOPHEN 500 MG PO TABS
1000.0000 mg | ORAL_TABLET | Freq: Once | ORAL | Status: AC
  Administered 2016-05-22: 1000 mg via ORAL
  Filled 2016-05-22: qty 2

## 2016-05-22 MED ORDER — PROMETHAZINE HCL 12.5 MG PO TABS: 12.5000 mg | ORAL_TABLET | Freq: Four times a day (QID) | ORAL | 0 refills | Status: DC | PRN

## 2016-05-22 NOTE — MAU Note (Signed)
States she had a + HPT last week. C/O nausea, vomiting, dizziness, lightheaded. Started about 2 weeks ago. No bleeding or vaginal D/C. C/O lower abdominal and back pain, HA.

## 2016-05-22 NOTE — MAU Provider Note (Signed)
History     CSN: [id number]  Arrival date and time: 05/22/16 1800   First Provider Initiated Contact with Patient 05/22/16 1836      Chief Complaint  Patient presents with  . Nausea  . Abdominal Cramping  . Headache   Erica Mack is a 30 y.o. [id number] at [redacted]w[redacted]d LMP who presents with abdominal cramping, n/v, & headache.    Abdominal Cramping  This is a new problem. The current episode started in the past 7 days. The problem occurs intermittently. The problem has been unchanged. The pain is located in the LLQ and RLQ. The pain is at a severity of 7/10. The quality of the pain is cramping. The abdominal pain does not radiate. Associated symptoms include headaches, nausea and vomiting. Pertinent negatives include no anorexia, constipation, diarrhea, dysuria, fever or frequency. Nothing aggravates the pain. The pain is relieved by nothing. She has tried nothing for the symptoms.  Headache   This is a new problem. The current episode started today. The problem occurs constantly. The problem has been unchanged. The pain is located in the bilateral and frontal region. The pain does not radiate. The pain quality is similar to prior headaches. The quality of the pain is described as aching and dull. The pain is at a severity of 9/10. Associated symptoms include abdominal pain, dizziness, nausea and vomiting. Pertinent negatives include no abnormal behavior, anorexia, blurred vision, coughing, fever, phonophobia or photophobia. Nothing aggravates the symptoms. She has tried nothing for the symptoms.  Emesis   This is a new problem. The current episode started in the past 7 days. The problem occurs 2 to 4 times per day. The problem has been unchanged. The emesis has an appearance of bile. There has been no fever. Associated symptoms include abdominal pain, dizziness and headaches. Pertinent negatives include no chills, coughing, diarrhea or fever. She has tried nothing for the symptoms.    OB  History    Gravida Para Term Preterm AB Living   5 2 2   2 2    SAB TAB Ectopic Multiple Live Births     2     2      Past Medical History:  Diagnosis Date  . Chlamydia contact, treated   . Gonorrhea   . Headache(784.0)   . Hypertension    no meds; dx 2008    Past Surgical History:  Procedure Laterality Date  . DILATION AND CURETTAGE OF UTERUS    . INDUCED ABORTION      Family History  Problem Relation Age of Onset  . Hypertension Mother   . Diabetes Brother   . Hypertension Maternal Aunt   . Cancer Maternal Grandmother   . Hypertension Maternal Grandmother   . Anesthesia problems Neg Hx     Social History  Substance Use Topics  . Smoking status: Former Games developer  . Smokeless tobacco: Never Used  . Alcohol use 3.3 oz/week    3 Shots of liquor, 3 Standard drinks or equivalent per week     Comment: occasional alcohol    Allergies:  Allergies  Allergen Reactions  . Aspirin Itching    Prescriptions Prior to Admission  Medication Sig Dispense Refill Last Dose  . albuterol (PROVENTIL HFA;VENTOLIN HFA) 108 (90 Base) MCG/ACT inhaler Inhale 3 puffs into the lungs every 6 (six) hours as needed for wheezing or shortness of breath.   Past Week at Unknown time  . aspirin-acetaminophen-caffeine (EXCEDRIN MIGRAINE) 250-250-65 MG per tablet Take 2 tablets by mouth  every 6 (six) hours as needed for headache.   Past Month at Unknown time  . promethazine (PHENERGAN) 25 MG tablet Take 1 tablet (25 mg total) by mouth every 6 (six) hours as needed. 30 tablet 1     Review of Systems  Constitutional: Negative for chills and fever.  Eyes: Negative for blurred vision and photophobia.  Respiratory: Negative for cough.   Gastrointestinal: Positive for abdominal pain, nausea and vomiting. Negative for anorexia, constipation and diarrhea.  Genitourinary: Negative for dysuria and frequency.       No vaginal bleeding  Neurological: Positive for dizziness and headaches.   Physical Exam    Blood pressure 136/84, pulse 88, temperature 98.9 F (37.2 C), temperature source Oral, resp. rate 18, height 5\' 6"  (1.676 m), weight 207 lb (93.9 kg), last menstrual period 04/25/2016, SpO2 100 %.  Physical Exam  Nursing note and vitals reviewed. Constitutional: She is oriented to person, place, and time. She appears well-developed and well-nourished. No distress.  HENT:  Head: Normocephalic and atraumatic.  Eyes: Conjunctivae are normal. Right eye exhibits no discharge. Left eye exhibits no discharge. No scleral icterus.  Neck: Normal range of motion.  Cardiovascular: Normal rate, regular rhythm and normal heart sounds.   No murmur heard. Respiratory: Effort normal and breath sounds normal. No respiratory distress. She has no wheezes.  GI: Soft. She exhibits no distension. There is no tenderness. There is no rebound.  Genitourinary: Uterus normal. Cervix exhibits no motion tenderness, no discharge and no friability. Right adnexum displays no mass and no tenderness. Left adnexum displays no mass and no tenderness. No bleeding in the vagina. Vaginal discharge (minimal amount of thin tan discharge) found.  Genitourinary Comments: Cervix closed  Neurological: She is alert and oriented to person, place, and time.  Skin: Skin is warm and dry. She is not diaphoretic.  Psychiatric: She has a normal mood and affect. Her behavior is normal. Judgment and thought content normal.    MAU Course  Procedures Results for orders placed or performed during the hospital encounter of 05/22/16 (from the past 24 hour(s))  Urinalysis, Routine w reflex microscopic (not at [facility])     Status: Abnormal   Collection Time: 05/22/16  6:08 PM  Result Value Ref Range   Color, Urine YELLOW YELLOW   APPearance CLEAR CLEAR   Specific Gravity, Urine 1.020 1.005 - 1.030   pH 6.0 5.0 - 8.0   Glucose, UA NEGATIVE NEGATIVE mg/dL   Hgb urine dipstick TRACE (A) NEGATIVE   Bilirubin Urine NEGATIVE NEGATIVE   Ketones, ur  NEGATIVE NEGATIVE mg/dL   Protein, ur NEGATIVE NEGATIVE mg/dL   Nitrite NEGATIVE NEGATIVE   Leukocytes, UA NEGATIVE NEGATIVE  Urine microscopic-add on     Status: Abnormal   Collection Time: 05/22/16  6:08 PM  Result Value Ref Range   Squamous Epithelial / LPF 0-5 (A) NONE SEEN   WBC, UA NONE SEEN 0 - 5 WBC/hpf   RBC / HPF 0-5 0 - 5 RBC/hpf   Bacteria, UA NONE SEEN NONE SEEN  Pregnancy, urine POC     Status: Abnormal   Collection Time: 05/22/16  6:26 PM  Result Value Ref Range   Preg Test, Ur POSITIVE (A) NEGATIVE  CBC     Status: Abnormal   Collection Time: 05/22/16  6:39 PM  Result Value Ref Range   WBC 12.0 (H) 4.0 - 10.5 K/uL   RBC 4.30 3.87 - 5.11 MIL/uL   Hemoglobin 12.5 12.0 - 15.0 g/dL  HCT 35.9 (L) 36.0 - 46.0 %   MCV 83.5 78.0 - 100.0 fL   MCH 29.1 26.0 - 34.0 pg   MCHC 34.8 30.0 - 36.0 g/dL   RDW 16.1 09.6 - 04.5 %   Platelets 273 150 - 400 K/uL  ABO/Rh     Status: None   Collection Time: 05/22/16  6:39 PM  Result Value Ref Range   ABO/RH(D) B POS   hCG, quantitative, pregnancy     Status: Abnormal   Collection Time: 05/22/16  6:39 PM  Result Value Ref Range   hCG, Beta Chain, Quant, S 472 (H) <5 mIU/mL  Wet prep, genital     Status: Abnormal   Collection Time: 05/22/16  7:50 PM  Result Value Ref Range   Yeast Wet Prep HPF POC NONE SEEN NONE SEEN   Trich, Wet Prep NONE SEEN NONE SEEN   Clue Cells Wet Prep HPF POC NONE SEEN NONE SEEN   WBC, Wet Prep HPF POC FEW (A) NONE SEEN   Sperm NONE SEEN    US Ob Comp Less 14 Wks  Result Date: 05/22/2016 CLINICAL DATA:  Lower abdominal/pelvic pain EXAM: OBSTETRIC <14 WK [place] AND TRANSVAGINAL OB US TECHNIQUE: Both transabdominal and transvaginal ultrasound examinations were performed for complete evaluation of the gestation as well as the maternal uterus, adnexal regions, and pelvic cul-de-sac. Transvaginal technique was performed to assess early pregnancy. COMPARISON:  None. FINDINGS: Intrauterine gestational sac: There is  a small sac-like area in the uterus, probably a double decidual sac. Yolk sac:  Not visualized Embryo:  Not visualized Cardiac Activity: Not visualized MSD: 3 mm  mm   5 w   0  d Subchorionic hemorrhage:  None visualized. Maternal uterus/adnexae: Cervical os is closed. There is no extrauterine pelvic or adnexal mass. There is a trace of free pelvic fluid. IMPRESSION: Probable early intrauterine gestational sac, but no yolk sac, fetal pole, or cardiac activity yet visualized. Recommend follow-up quantitative B-HCG levels and follow-up US in 14 days to confirm and assess viability. This recommendation follows SRU consensus guidelines: Diagnostic Criteria for Nonviable Pregnancy Early in the First Trimester. Malva Limes Med 2013; 409:8119-14. Days is advised to further evaluate. Estimated gestational age is 5 weeks based on apparent gestational sac size. Study otherwise unremarkable. Electronically Signed   By: Bretta Bang III M.D.   On: 05/22/2016 20:00   US Ob Transvaginal  Result Date: 05/22/2016 CLINICAL DATA:  Lower abdominal/pelvic pain EXAM: OBSTETRIC <14 WK [place] AND TRANSVAGINAL OB US TECHNIQUE: Both transabdominal and transvaginal ultrasound examinations were performed for complete evaluation of the gestation as well as the maternal uterus, adnexal regions, and pelvic cul-de-sac. Transvaginal technique was performed to assess early pregnancy. COMPARISON:  None. FINDINGS: Intrauterine gestational sac: There is a small sac-like area in the uterus, probably a double decidual sac. Yolk sac:  Not visualized Embryo:  Not visualized Cardiac Activity: Not visualized MSD: 3 mm  mm   5 w   0  d Subchorionic hemorrhage:  None visualized. Maternal uterus/adnexae: Cervical os is closed. There is no extrauterine pelvic or adnexal mass. There is a trace of free pelvic fluid. IMPRESSION: Probable early intrauterine gestational sac, but no yolk sac, fetal pole, or cardiac activity yet visualized. Recommend follow-up  quantitative B-HCG levels and follow-up US in 14 days to confirm and assess viability. This recommendation follows SRU consensus guidelines: Diagnostic Criteria for Nonviable Pregnancy Early in the First Trimester. Malva Limes Med 2013; 782:9562-13. Days is advised  to further evaluate. Estimated gestational age is 5 weeks based on apparent gestational sac size. Study otherwise unremarkable. Electronically Signed   By: Bretta BangWilliam  Woodruff III M.D.   On: 05/22/2016 20:00    MDM UPT positive CBC, BHCG, abo/rh, GC/CT, wet prep, u/a, ultrasound B positive Ultrasound shows possible IUGS, no yolk sac, no adnexal mass BHCG 472 Tylenol 1 gm po Phenergan 25 mg po Pt not observed vomiting in mau  Assessment and Plan  A: 1. Pregnancy of unknown anatomic location   2. Abdominal pain affecting pregnancy   3. Nausea and vomiting during pregnancy prior to [redacted] weeks gestation   4. Headache in pregnancy, antepartum, first trimester     P: Discharge home Rx phenergan [facility] Thursday morning for BHCG Discussed reasons to return to MAU Take tylenol prn headache GC/Ct pending  Judeth Hornrin Alexa Golebiewski 05/22/2016, 6:57 PM

## 2016-05-22 NOTE — MAU Provider Note (Signed)
History    CSN: [id number]  Arrival date and time: 05/22/16 1800   First Provider Initiated Contact with Patient 05/22/16 1836      Chief Complaint  Patient presents with  . Nausea  . Dizziness   HPI Nausea / Vomiting Patient complains of nausea, vomiting, HA and dizziness.  Onset of symptoms was 2 weeks ago. Patient describes nausea as moderate. Vomiting has occurred about 1-2 times per day. Vomitus is described as normal gastric contents.  Symptoms have been associated with moderate abdominal pain and back pain and occasional diarrhea. Patient denies fever, sore throat, rhinorrhea and hematemesis. Symptoms have waxed and waned over the course the two weeks. Nausea is not as bad today. Treatment to date has not been none for the nausea, vomitng.  Pt reports a positive home pregnancy test.  The patient states that her headaches have occurred daily with the pain located in bilateral temples and behind her eyes.  She hasn't tried any medications for the headaches.  Her headache is present today.  Currently she does not feel as dizzy, but reports that she was dizzy earlier today.   Dizziness is relieved with rest and laying down.  No constipation, vaginal bleeding, vaginal discharge or sick contacts.  UPT done in the MAU is positive.  LMP was 04/25/16.    OB History    Gravida Para Term Preterm AB Living   5 2 2   2 2    SAB TAB Ectopic Multiple Live Births     2     2      Past Medical History:  Diagnosis Date  . Chlamydia contact, treated   . Gonorrhea   . Headache(784.0)   . Hypertension    no meds; dx 2008    Past Surgical History:  Procedure Laterality Date  . DILATION AND CURETTAGE OF UTERUS    . INDUCED ABORTION      Family History  Problem Relation Age of Onset  . Hypertension Mother   . Diabetes Brother   . Hypertension Maternal Aunt   . Cancer Maternal Grandmother   . Hypertension Maternal Grandmother   . Anesthesia problems Neg Hx     Social History   Substance Use Topics  . Smoking status: Former Games developer  . Smokeless tobacco: Never Used  . Alcohol use 3.3 oz/week    3 Shots of liquor, 3 Standard drinks or equivalent per week     Comment: occasional alcohol    Allergies:  Allergies  Allergen Reactions  . Aspirin Itching    Prescriptions Prior to Admission  Medication Sig Dispense Refill Last Dose  . albuterol (PROVENTIL HFA;VENTOLIN HFA) 108 (90 Base) MCG/ACT inhaler Inhale 3 puffs into the lungs every 6 (six) hours as needed for wheezing or shortness of breath.   Past Week at Unknown time  . aspirin-acetaminophen-caffeine (EXCEDRIN MIGRAINE) 250-250-65 MG per tablet Take 2 tablets by mouth every 6 (six) hours as needed for headache.   Past Month at Unknown time  . promethazine (PHENERGAN) 25 MG tablet Take 1 tablet (25 mg total) by mouth every 6 (six) hours as needed. 30 tablet 1     Review of Systems  Constitutional: Negative for chills, diaphoresis and fever.  HENT: Negative for congestion and sore throat.   Eyes: Negative for blurred vision, double vision and photophobia.  Respiratory: Negative for cough and hemoptysis.   Cardiovascular: Negative for chest pain and palpitations.  Gastrointestinal: Positive for abdominal pain, diarrhea, nausea and vomiting. Negative for constipation.  Genitourinary: Negative for hematuria.  Musculoskeletal: Positive for back pain. Negative for myalgias.  Neurological: Positive for dizziness and headaches.   Physical Exam   Blood pressure 143/78, pulse 86, temperature 98.9 F (37.2 C), temperature source Oral, resp. rate 18, height 5\' 6"  (1.676 m), weight 93.9 kg (207 lb), last menstrual period 04/25/2016, SpO2 100 %.  Physical Exam  Constitutional: She is oriented to person, place, and time. She appears well-developed and well-nourished.  Cardiovascular: Normal rate and regular rhythm.  Exam reveals no gallop and no friction rub.   No murmur heard. Respiratory: Effort normal. No  respiratory distress. She has no rales.  GI: Soft. Bowel sounds are normal. She exhibits no distension and no mass. There is no guarding.  Genitourinary: Vagina normal and uterus normal. There is no tenderness or lesion on the right labia. There is no tenderness or lesion on the left labia. Cervix exhibits no motion tenderness. Right adnexum displays no mass and no tenderness. Left adnexum displays no mass and no tenderness. No tenderness or bleeding in the vagina.  Genitourinary Comments: Parous cervix, no bleeding or friability  Musculoskeletal: She exhibits no edema or deformity.  Neurological: She is alert and oriented to person, place, and time.  Skin: Skin is warm and dry. No rash noted. She is not diaphoretic. No erythema.  Psychiatric: She has a normal mood and affect. Her behavior is normal.    Results for orders placed or performed during the hospital encounter of 05/22/16 (from the past 24 hour(s))  Urinalysis, Routine w reflex microscopic (not at [facility])     Status: Abnormal   Collection Time: 05/22/16  6:08 PM  Result Value Ref Range   Color, Urine YELLOW YELLOW   APPearance CLEAR CLEAR   Specific Gravity, Urine 1.020 1.005 - 1.030   pH 6.0 5.0 - 8.0   Glucose, UA NEGATIVE NEGATIVE mg/dL   Hgb urine dipstick TRACE (A) NEGATIVE   Bilirubin Urine NEGATIVE NEGATIVE   Ketones, ur NEGATIVE NEGATIVE mg/dL   Protein, ur NEGATIVE NEGATIVE mg/dL   Nitrite NEGATIVE NEGATIVE   Leukocytes, UA NEGATIVE NEGATIVE  Urine microscopic-add on     Status: Abnormal   Collection Time: 05/22/16  6:08 PM  Result Value Ref Range   Squamous Epithelial / LPF 0-5 (A) NONE SEEN   WBC, UA NONE SEEN 0 - 5 WBC/hpf   RBC / HPF 0-5 0 - 5 RBC/hpf   Bacteria, UA NONE SEEN NONE SEEN  Pregnancy, urine POC     Status: Abnormal   Collection Time: 05/22/16  6:26 PM  Result Value Ref Range   Preg Test, Ur POSITIVE (A) NEGATIVE  CBC     Status: Abnormal   Collection Time: 05/22/16  6:39 PM  Result Value Ref  Range   WBC 12.0 (H) 4.0 - 10.5 K/uL   RBC 4.30 3.87 - 5.11 MIL/uL   Hemoglobin 12.5 12.0 - 15.0 g/dL   HCT 16.1 (L) 09.6 - 04.5 %   MCV 83.5 78.0 - 100.0 fL   MCH 29.1 26.0 - 34.0 pg   MCHC 34.8 30.0 - 36.0 g/dL   RDW 40.9 81.1 - 91.4 %   Platelets 273 150 - 400 K/uL  ABO/Rh     Status: None   Collection Time: 05/22/16  6:39 PM  Result Value Ref Range   ABO/RH(D) B POS   hCG, quantitative, pregnancy     Status: Abnormal   Collection Time: 05/22/16  6:39 PM  Result Value Ref Range  hCG, Beta Chain, Quant, S 472 (H) <5 mIU/mL   -Gc/Chlamydia and wet prep results pending  MAU Course  Procedures  MDM A POC UPT and abdominal US were obtained along with routine 1st trimester labs.  CBC was ordered to rule out emergent causes of abdominal pain, PID.  Assessment and Plan  1. Evaluation of IUP, 1st trimester  - the patient will look to establish OB care     2. Headache  - Fioricet 50mg  1 tab PO q4h prn. Total daily  dose not to exceed 6 tabs.   3. Nausea  - Phenergan 25mg  PO q6h prn  4. Dizziness  -Pt educated on adequate fluid intake and relief  of dizziness with prone position.  Discharge home.  Pt was informed to return if symptoms worsen or do not resolve with treatment.  Jylian Pappalardo 05/22/2016, 6:43 PM

## 2016-05-22 NOTE — Discharge Instructions (Signed)
Abdominal Pain During Pregnancy °Abdominal pain is common in pregnancy. Most of the time, it does not cause harm. There are many causes of abdominal pain. Some causes are more serious than others. Some of the causes of abdominal pain in pregnancy are easily diagnosed. Occasionally, the diagnosis takes time to understand. Other times, the cause is not determined. Abdominal pain can be a sign that something is very wrong with the pregnancy, or the pain may have nothing to do with the pregnancy at all. For this reason, always tell your health care provider if you have any abdominal discomfort. °HOME CARE INSTRUCTIONS  °Monitor your abdominal pain for any changes. The following actions may help to alleviate any discomfort you are experiencing: °· Do not have sexual intercourse or put anything in your vagina until your symptoms go away completely. °· Get plenty of rest until your pain improves. °· Drink clear fluids if you feel nauseous. Avoid solid food as long as you are uncomfortable or nauseous. °· Only take over-the-counter or prescription medicine as directed by your health care provider. °· Keep all follow-up appointments with your health care provider. °SEEK IMMEDIATE MEDICAL CARE IF: °· You are bleeding, leaking fluid, or passing tissue from the vagina. °· You have increasing pain or cramping. °· You have persistent vomiting. °· You have painful or bloody urination. °· You have a fever. °· You notice a decrease in your baby's movements. °· You have extreme weakness or feel faint. °· You have shortness of breath, with or without abdominal pain. °· You develop a severe headache with abdominal pain. °· You have abnormal vaginal discharge with abdominal pain. °· You have persistent diarrhea. °· You have abdominal pain that continues even after rest, or gets worse. °MAKE SURE YOU:  °· Understand these instructions. °· Will watch your condition. °· Will get help right away if you are not doing well or get worse. °    °This information is not intended to replace advice given to you by your health care provider. Make sure you discuss any questions you have with your health care provider. °  °Document Released: 09/11/2005 Document Revised: 07/02/2013 Document Reviewed: 04/10/2013 °Elsevier Interactive Patient Education ©2016 Elsevier Inc[employer] °Morning Sickness °Morning sickness is when you feel sick to your stomach (nauseous) during pregnancy. This nauseous feeling may or may not come with vomiting. It often occurs in the morning but can be a problem any time of day. Morning sickness is most common during the first trimester, but it may continue throughout pregnancy. While morning sickness is unpleasant, it is usually harmless unless you develop severe and continual vomiting (hyperemesis gravidarum). This condition requires more intense treatment.  °CAUSES  °The cause of morning sickness is not completely known but seems to be related to normal hormonal changes that occur in pregnancy. °RISK FACTORS °You are at greater risk if you: °· Experienced nausea or vomiting before your pregnancy. °· Had morning sickness during a previous pregnancy. °· Are pregnant with more than one baby, such as twins. °TREATMENT  °Do not use any medicines (prescription, over-the-counter, or herbal) for morning sickness without first talking to your health care provider. Your health care provider may prescribe or recommend: °· Vitamin B6 supplements. °· Anti-nausea medicines. °· The herbal medicine ginger. °HOME CARE INSTRUCTIONS  °· Only take over-the-counter or prescription medicines as directed by your health care provider. °· Taking multivitamins before getting pregnant can prevent or decrease the severity of morning sickness in most women. °· Eat a piece   of dry toast or unsalted crackers before getting out of bed in the morning. °· Eat five or six small meals a day. °· Eat dry and bland foods (rice, baked potato). Foods high in carbohydrates are often  helpful. °· Do not drink liquids with your meals. Drink liquids between meals. °· Avoid greasy, fatty, and spicy foods. °· Get someone to cook for you if the smell of any food causes nausea and vomiting. °· If you feel nauseous after taking prenatal vitamins, take the vitamins at night or with a snack.  °· Snack on protein foods (nuts, yogurt, cheese) between meals if you are hungry. °· Eat unsweetened gelatins for desserts. °· Wearing an acupressure wristband (worn for sea sickness) may be helpful. °· Acupuncture may be helpful. °· Do not smoke. °· Get a humidifier to keep the air in your house free of odors. °· Get plenty of fresh air. °SEEK MEDICAL CARE IF:  °· Your home remedies are not working, and you need medicine. °· You feel dizzy or lightheaded. °· You are losing weight. °SEEK IMMEDIATE MEDICAL CARE IF:  °· You have persistent and uncontrolled nausea and vomiting. °· You pass out (faint). °MAKE SURE YOU: °· Understand these instructions. °· Will watch your condition. °· Will get help right away if you are not doing well or get worse. °  °This information is not intended to replace advice given to you by your health care provider. Make sure you discuss any questions you have with your health care provider. °  °Document Released: 11/02/2006 Document Revised: 09/16/2013 Document Reviewed: 02/26/2013 °Elsevier Interactive Patient Education ©2016 Elsevier Inc. ° °

## 2016-05-23 LAB — GC/CHLAMYDIA PROBE AMP (~~LOC~~) NOT AT ARMC
CHLAMYDIA, DNA PROBE: NEGATIVE
Neisseria Gonorrhea: NEGATIVE

## 2016-05-23 LAB — HIV ANTIBODY (ROUTINE TESTING W REFLEX): HIV Screen 4th Generation wRfx: NONREACTIVE

## 2016-05-25 ENCOUNTER — Ambulatory Visit: Payer: Self-pay

## 2016-05-25 ENCOUNTER — Telehealth: Payer: Self-pay | Admitting: *Deleted

## 2016-05-25 NOTE — Telephone Encounter (Signed)
Called patient and left message for her to call us back. Patient no showed for her hcg appointment today.

## 2016-05-31 ENCOUNTER — Other Ambulatory Visit: Payer: Self-pay

## 2016-05-31 ENCOUNTER — Inpatient Hospital Stay (HOSPITAL_COMMUNITY): Payer: Medicaid Other

## 2016-05-31 ENCOUNTER — Inpatient Hospital Stay (HOSPITAL_COMMUNITY)
Admission: AD | Admit: 2016-05-31 | Discharge: 2016-06-01 | Disposition: A | Discharge status: Home / Self Care | Payer: Medicaid Other | Source: Ambulatory Visit | Attending: Obstetrics & Gynecology | Admitting: Obstetrics & Gynecology

## 2016-05-31 DIAGNOSIS — O9989 Other specified diseases and conditions complicating pregnancy, childbirth and the puerperium: Secondary | ICD-10-CM | POA: Diagnosis not present

## 2016-05-31 DIAGNOSIS — Z87891 Personal history of nicotine dependence: Secondary | ICD-10-CM | POA: Diagnosis not present

## 2016-05-31 DIAGNOSIS — R109 Unspecified abdominal pain: Secondary | ICD-10-CM | POA: Diagnosis present

## 2016-05-31 DIAGNOSIS — O26899 Other specified pregnancy related conditions, unspecified trimester: Secondary | ICD-10-CM

## 2016-05-31 DIAGNOSIS — O26891 Other specified pregnancy related conditions, first trimester: Secondary | ICD-10-CM | POA: Insufficient documentation

## 2016-05-31 DIAGNOSIS — O10911 Unspecified pre-existing hypertension complicating pregnancy, first trimester: Secondary | ICD-10-CM | POA: Insufficient documentation

## 2016-05-31 DIAGNOSIS — Z3A01 Less than 8 weeks gestation of pregnancy: Secondary | ICD-10-CM

## 2016-05-31 LAB — HCG, QUANTITATIVE, PREGNANCY: hCG, Beta Chain, Quant, S: 17519 m[IU]/mL — ABNORMAL HIGH (ref <5)

## 2016-05-31 NOTE — MAU Provider Note (Signed)
History     CSN: [id number]  Arrival date and time: 05/31/16 1920   None     Chief Complaint  Patient presents with  . Follow-up   Pelvic Pain  The patient's primary symptoms include pelvic pain. This is a new problem. The current episode started 1 to 4 weeks ago. The problem occurs intermittently. The problem has been unchanged. Pain severity now: 7/10  The problem affects both sides. She is pregnant. Associated symptoms include nausea and vomiting. Pertinent negatives include no abdominal pain, chills, constipation, diarrhea, dysuria, fever, frequency or urgency. The vaginal discharge was normal. Nothing aggravates the symptoms. She has tried nothing for the symptoms. Menstrual history: LMP 04/25/16     Past Medical History:  Diagnosis Date  . Chlamydia contact, treated   . Gonorrhea   . Headache(784.0)   . Hypertension    no meds; dx 2008    Past Surgical History:  Procedure Laterality Date  . DILATION AND CURETTAGE OF UTERUS    . INDUCED ABORTION      Family History  Problem Relation Age of Onset  . Hypertension Mother   . Diabetes Brother   . Hypertension Maternal Aunt   . Cancer Maternal Grandmother   . Hypertension Maternal Grandmother   . Anesthesia problems Neg Hx     Social History  Substance Use Topics  . Smoking status: Former Games developer  . Smokeless tobacco: Never Used  . Alcohol use 3.3 oz/week    3 Shots of liquor, 3 Standard drinks or equivalent per week     Comment: occasional alcohol    Allergies:  Allergies  Allergen Reactions  . Aspirin Itching    Prescriptions Prior to Admission  Medication Sig Dispense Refill Last Dose  . albuterol (PROVENTIL HFA;VENTOLIN HFA) 108 (90 Base) MCG/ACT inhaler Inhale 3 puffs into the lungs every 6 (six) hours as needed for wheezing or shortness of breath.   Past Week at Unknown time  . promethazine (PHENERGAN) 12.5 MG tablet Take 1 tablet (12.5 mg total) by mouth every 6 (six) hours as needed for nausea or  vomiting. 30 tablet 0     Review of Systems  Constitutional: Negative for chills and fever.  Gastrointestinal: Positive for nausea and vomiting. Negative for abdominal pain, constipation and diarrhea.  Genitourinary: Positive for pelvic pain. Negative for dysuria, frequency and urgency.   Physical Exam   Blood pressure 130/69, pulse 85, temperature 98.3 F (36.8 C), temperature source Oral, resp. rate 18, last menstrual period 04/25/2016.  Physical Exam  Nursing note and vitals reviewed. Constitutional: She is oriented to person, place, and time. She appears well-developed and well-nourished. No distress.  HENT:  Head: Normocephalic.  Cardiovascular: Normal rate.   Respiratory: Effort normal.  GI: Soft. There is no tenderness. There is no rebound.  Neurological: She is alert and oriented to person, place, and time.  Skin: Skin is warm and dry.  Psychiatric: She has a normal mood and affect.    Results for orders placed or performed during the hospital encounter of 05/31/16 (from the past 24 hour(s))  hCG, quantitative, pregnancy     Status: Abnormal   Collection Time: 05/31/16  7:44 PM  Result Value Ref Range   hCG, Beta Chain, Quant, S 17,519 (H) <5 mIU/mL   MAU Course  Procedures  MDM   Assessment and Plan   1. [redacted] weeks gestation of pregnancy   2. Abdominal pain affecting pregnancy    DC home Comfort measures reviewed  1st Trimester  precautions  RX: none, patient has phenergan at home. Recommended to take as needed for nausea.  Return to MAU as needed FU with OB as planned  Follow-up Information    HARPER,CHARLES A, MD .   Specialty:  Obstetrics and Gynecology Contact information: [address] [place] [address] [phone]            Tawnya CrookHogan, Pallas Wahlert Donovan 05/31/2016, 10:19 PM

## 2016-05-31 NOTE — MAU Note (Signed)
Pt presents for repeat BHCG. Denies bleeding. Pain the same as before.

## 2016-05-31 NOTE — Discharge Instructions (Signed)
First Trimester of Pregnancy The first trimester of pregnancy is from week 1 until the end of week 12 (months 1 through 3). A week after a sperm fertilizes an egg, the egg will implant on the wall of the uterus. This embryo will begin to develop into a baby. Genes from you and your partner are forming the baby. The female genes determine whether the baby is a boy or a girl. At 6-8 weeks, the eyes and face are formed, and the heartbeat can be seen on ultrasound. At the end of 12 weeks, all the baby's organs are formed.  Now that you are pregnant, you will want to do everything you can to have a healthy baby. Two of the most important things are to get good prenatal care and to follow your health care provider's instructions. Prenatal care is all the medical care you receive before the baby's birth. This care will help prevent, find, and treat any problems during the pregnancy and childbirth. BODY CHANGES Your body goes through many changes during pregnancy. The changes vary from woman to woman.   You may gain or lose a couple of pounds at first.  You may feel sick to your stomach (nauseous) and throw up (vomit). If the vomiting is uncontrollable, call your health care provider.  You may tire easily.  You may develop headaches that can be relieved by medicines approved by your health care provider.  You may urinate more often. Painful urination may mean you have a bladder infection.  You may develop heartburn as a result of your pregnancy.  You may develop constipation because certain hormones are causing the muscles that push waste through your intestines to slow down.  You may develop hemorrhoids or swollen, bulging veins (varicose veins).  Your breasts may begin to grow larger and become tender. Your nipples may stick out more, and the tissue that surrounds them (areola) may become darker.  Your gums may bleed and may be sensitive to brushing and flossing.  Dark spots or blotches (chloasma,  mask of pregnancy) may develop on your face. This will likely fade after the baby is born.  Your menstrual periods will stop.  You may have a loss of appetite.  You may develop cravings for certain kinds of food.  You may have changes in your emotions from day to day, such as being excited to be pregnant or being concerned that something may go wrong with the pregnancy and baby.  You may have more vivid and strange dreams.  You may have changes in your hair. These can include thickening of your hair, rapid growth, and changes in texture. Some women also have hair loss during or after pregnancy, or hair that feels dry or thin. Your hair will most likely return to normal after your baby is born. WHAT TO EXPECT AT YOUR PRENATAL VISITS During a routine prenatal visit:  You will be weighed to make sure you and the baby are growing normally.  Your blood pressure will be taken.  Your abdomen will be measured to track your baby's growth.  The fetal heartbeat will be listened to starting around week 10 or 12 of your pregnancy.  Test results from any previous visits will be discussed. Your health care provider may ask you:  How you are feeling.  If you are feeling the baby move.  If you have had any abnormal symptoms, such as leaking fluid, bleeding, severe headaches, or abdominal cramping.  If you are using any tobacco products,   including cigarettes, chewing tobacco, and electronic cigarettes.  If you have any questions. Other tests that may be performed during your first trimester include:  Blood tests to find your blood type and to check for the presence of any previous infections. They will also be used to check for low iron levels (anemia) and Rh antibodies. Later in the pregnancy, blood tests for diabetes will be done along with other tests if problems develop.  Urine tests to check for infections, diabetes, or protein in the urine.  An ultrasound to confirm the proper growth  and development of the baby.  An amniocentesis to check for possible genetic problems.  Fetal screens for spina bifida and Down syndrome.  You may need other tests to make sure you and the baby are doing well.  HIV (human immunodeficiency virus) testing. Routine prenatal testing includes screening for HIV, unless you choose not to have this test. HOME CARE INSTRUCTIONS  Medicines  Follow your health care provider's instructions regarding medicine use. Specific medicines may be either safe or unsafe to take during pregnancy.  Take your prenatal vitamins as directed.  If you develop constipation, try taking a stool softener if your health care provider approves. Diet  Eat regular, well-balanced meals. Choose a variety of foods, such as meat or vegetable-based protein, fish, milk and low-fat dairy products, vegetables, fruits, and whole grain breads and cereals. Your health care provider will help you determine the amount of weight gain that is right for you.  Avoid raw meat and uncooked cheese. These carry germs that can cause birth defects in the baby.  Eating four or five small meals rather than three large meals a day may help relieve nausea and vomiting. If you start to feel nauseous, eating a few soda crackers can be helpful. Drinking liquids between meals instead of during meals also seems to help nausea and vomiting.  If you develop constipation, eat more high-fiber foods, such as fresh vegetables or fruit and whole grains. Drink enough fluids to keep your urine clear or pale yellow. Activity and Exercise  Exercise only as directed by your health care provider. Exercising will help you:  Control your weight.  Stay in shape.  Be prepared for labor and delivery.  Experiencing pain or cramping in the lower abdomen or low back is a good sign that you should stop exercising. Check with your health care provider before continuing normal exercises.  Try to avoid standing for long  periods of time. Move your legs often if you must stand in one place for a long time.  Avoid heavy lifting.  Wear low-heeled shoes, and practice good posture.  You may continue to have sex unless your health care provider directs you otherwise. Relief of Pain or Discomfort  Wear a good support bra for breast tenderness.   Take warm sitz baths to soothe any pain or discomfort caused by hemorrhoids. Use hemorrhoid cream if your health care provider approves.   Rest with your legs elevated if you have leg cramps or low back pain.  If you develop varicose veins in your legs, wear support hose. Elevate your feet for 15 minutes, 3-4 times a day. Limit salt in your diet. Prenatal Care  Schedule your prenatal visits by the twelfth week of pregnancy. They are usually scheduled monthly at first, then more often in the last 2 months before delivery.  Write down your questions. Take them to your prenatal visits.  Keep all your prenatal visits as directed by your   health care provider. Safety  Wear your seat belt at all times when driving.  Make a list of emergency phone numbers, including numbers for family, friends, the hospital, and police and fire departments. General Tips  Ask your health care provider for a referral to a local prenatal education class. Begin classes no later than at the beginning of month 6 of your pregnancy.  Ask for help if you have counseling or nutritional needs during pregnancy. Your health care provider can offer advice or refer you to specialists for help with various needs.  Do not use hot tubs, steam rooms, or saunas.  Do not douche or use tampons or scented sanitary pads.  Do not cross your legs for long periods of time.  Avoid cat litter boxes and soil used by cats. These carry germs that can cause birth defects in the baby and possibly loss of the fetus by miscarriage or stillbirth.  Avoid all smoking, herbs, alcohol, and medicines not prescribed by  your health care provider. Chemicals in these affect the formation and growth of the baby.  Do not use any tobacco products, including cigarettes, chewing tobacco, and electronic cigarettes. If you need help quitting, ask your health care provider. You may receive counseling support and other resources to help you quit.  Schedule a dentist appointment. At home, brush your teeth with a soft toothbrush and be gentle when you floss. SEEK MEDICAL CARE IF:   You have dizziness.  You have mild pelvic cramps, pelvic pressure, or nagging pain in the abdominal area.  You have persistent nausea, vomiting, or diarrhea.  You have a bad smelling vaginal discharge.  You have pain with urination.  You notice increased swelling in your face, hands, legs, or ankles. SEEK IMMEDIATE MEDICAL CARE IF:   You have a fever.  You are leaking fluid from your vagina.  You have spotting or bleeding from your vagina.  You have severe abdominal cramping or pain.  You have rapid weight gain or loss.  You vomit blood or material that looks like coffee grounds.  You are exposed to German measles and have never had them.  You are exposed to fifth disease or chickenpox.  You develop a severe headache.  You have shortness of breath.  You have any kind of trauma, such as from a fall or a car accident.   This information is not intended to replace advice given to you by your health care provider. Make sure you discuss any questions you have with your health care provider.   Document Released: 09/05/2001 Document Revised: 10/02/2014 Document Reviewed: 07/22/2013 Elsevier Interactive Patient Education 2016 [employer]  

## 2016-06-01 ENCOUNTER — Other Ambulatory Visit: Payer: Self-pay

## 2016-06-01 DIAGNOSIS — O26891 Other specified pregnancy related conditions, first trimester: Secondary | ICD-10-CM | POA: Diagnosis not present

## 2016-06-07 ENCOUNTER — Inpatient Hospital Stay (HOSPITAL_COMMUNITY)
Admission: AD | Admit: 2016-06-07 | Discharge: 2016-06-08 | Disposition: A | Discharge status: Home / Self Care | Payer: Medicaid Other | Source: Ambulatory Visit | Attending: Obstetrics and Gynecology | Admitting: Obstetrics and Gynecology

## 2016-06-07 ENCOUNTER — Encounter (HOSPITAL_COMMUNITY): Payer: Self-pay

## 2016-06-07 DIAGNOSIS — O210 Mild hyperemesis gravidarum: Secondary | ICD-10-CM | POA: Insufficient documentation

## 2016-06-07 DIAGNOSIS — Z87891 Personal history of nicotine dependence: Secondary | ICD-10-CM | POA: Diagnosis not present

## 2016-06-07 DIAGNOSIS — Z3A01 Less than 8 weeks gestation of pregnancy: Secondary | ICD-10-CM

## 2016-06-07 DIAGNOSIS — O219 Vomiting of pregnancy, unspecified: Secondary | ICD-10-CM | POA: Diagnosis not present

## 2016-06-07 LAB — URINALYSIS, ROUTINE W REFLEX MICROSCOPIC
BILIRUBIN URINE: NEGATIVE
Glucose, UA: NEGATIVE mg/dL
KETONES UR: NEGATIVE mg/dL
Leukocytes, UA: NEGATIVE
NITRITE: NEGATIVE
PROTEIN: NEGATIVE mg/dL
SPECIFIC GRAVITY, URINE: 1.01 (ref 1.005–1.030)
pH: 6 (ref 5.0–8.0)

## 2016-06-07 LAB — URINE MICROSCOPIC-ADD ON

## 2016-06-07 MED ORDER — PROMETHAZINE HCL 25 MG/ML IJ SOLN
25.0000 mg | Freq: Once | INTRAVENOUS | Status: AC
  Administered 2016-06-07: 25 mg via INTRAVENOUS
  Filled 2016-06-07: qty 1

## 2016-06-07 NOTE — Discharge Instructions (Signed)

## 2016-06-07 NOTE — MAU Provider Note (Signed)
History     CSN: [id number]  Arrival date and time: 06/07/16 2202   First Provider Initiated Contact with Patient 06/07/16 2231      Chief Complaint  Patient presents with  . Emesis   Erica Mack is a 30 y.o. [id number] at [redacted]w[redacted]d who presents today with nausea and vomiting. She states that she tried the phenergan a couple of times, but didn't help.    Emesis   This is a new problem. The current episode started yesterday. The problem occurs 2 to 4 times per day. The problem has been unchanged. The emesis has an appearance of stomach contents. There has been no fever. Pertinent negatives include no abdominal pain, chills, diarrhea or fever. Risk factors: pregnancy  Treatments tried: Patient has phenergan, but states that she has not taken it in a couple of days.     Past Medical History:  Diagnosis Date  . Chlamydia contact, treated   . Gonorrhea   . Headache(784.0)   . Hypertension    no meds; dx 2008    Past Surgical History:  Procedure Laterality Date  . DILATION AND CURETTAGE OF UTERUS    . INDUCED ABORTION      Family History  Problem Relation Age of Onset  . Hypertension Mother   . Diabetes Brother   . Hypertension Maternal Aunt   . Cancer Maternal Grandmother   . Hypertension Maternal Grandmother   . Anesthesia problems Neg Hx     Social History  Substance Use Topics  . Smoking status: Former Games developer  . Smokeless tobacco: Never Used  . Alcohol use 3.3 oz/week    3 Shots of liquor, 3 Standard drinks or equivalent per week     Comment: occasional alcohol    Allergies:  Allergies  Allergen Reactions  . Aspirin Itching    Prescriptions Prior to Admission  Medication Sig Dispense Refill Last Dose  . diphenhydramine-acetaminophen (TYLENOL PM) 25-500 MG TABS tablet Take 1 tablet by mouth at bedtime as needed.   Past Week at Unknown time  . promethazine (PHENERGAN) 12.5 MG tablet Take 1 tablet (12.5 mg total) by mouth every 6 (six) hours as needed for  nausea or vomiting. 30 tablet 0 Past Week at Unknown time  . albuterol (PROVENTIL HFA;VENTOLIN HFA) 108 (90 Base) MCG/ACT inhaler Inhale 3 puffs into the lungs every 6 (six) hours as needed for wheezing or shortness of breath.   Past Week at Unknown time    Review of Systems  Constitutional: Negative for chills and fever.  Gastrointestinal: Positive for nausea and vomiting. Negative for abdominal pain, constipation and diarrhea.  Genitourinary: Negative for dysuria, frequency and urgency.   Physical Exam   Blood pressure 126/78, pulse 82, temperature 98.9 F (37.2 C), temperature source Oral, resp. rate 19, height 5\' 6"  (1.676 m), weight 211 lb 8 oz (95.9 kg), last menstrual period 04/25/2016, SpO2 99 %.  Physical Exam  Nursing note and vitals reviewed. Constitutional: She is oriented to person, place, and time. She appears well-developed and well-nourished. No distress.  HENT:  Head: Normocephalic.  Cardiovascular: Normal rate.   Respiratory: Effort normal.  GI: Soft. There is no tenderness. There is no rebound.  Neurological: She is alert and oriented to person, place, and time.  Skin: Skin is warm and dry.  Psychiatric: She has a normal mood and affect.    MAU Course  Procedures  MDM 2349: Patient has had phenergan and IV fluids. No vomiting while here. Tolerating PO Discussed phenergan  v zofran with the patient. Patient states that she does not want zofran. Recommended she continue to use phenergan as needed.  Assessment and Plan   1. Nausea/vomiting in pregnancy   2. [redacted] weeks gestation of pregnancy    DC home Comfort measures reviewed  1st Trimester precautions  RX: none, take phenergan as prescribed  Return to MAU as needed FU with OB as planned  Follow-up Information    HARPER,CHARLES A, MD .   Specialty:  Obstetrics and Gynecology Contact information: [address] [place] [address] [phone]            Tawnya CrookHogan, Heather  Donovan 06/07/2016, 10:32 PM

## 2016-06-07 NOTE — MAU Note (Signed)
Pt states that she has vomited all day x2 days. Reports only vomiting x3 times. Give RX for phenergan, but has not taken for several days. Pt reports upper/mid abdominal pain, but states that it is from vomiting.

## 2016-06-07 NOTE — MAU Note (Signed)
Pt reports she has been vomiting for 2 days, meds are not helping.

## 2016-06-15 ENCOUNTER — Inpatient Hospital Stay (HOSPITAL_COMMUNITY)
Admission: AD | Admit: 2016-06-15 | Discharge: 2016-06-15 | Disposition: A | Discharge status: Home / Self Care | Payer: Medicaid Other | Source: Ambulatory Visit | Attending: Obstetrics and Gynecology | Admitting: Obstetrics and Gynecology

## 2016-06-15 DIAGNOSIS — Z3A08 8 weeks gestation of pregnancy: Secondary | ICD-10-CM | POA: Diagnosis not present

## 2016-06-15 DIAGNOSIS — O219 Vomiting of pregnancy, unspecified: Secondary | ICD-10-CM | POA: Diagnosis present

## 2016-06-15 LAB — COMPREHENSIVE METABOLIC PANEL
ALT: 19 U/L (ref 14–54)
ANION GAP: 6 (ref 5–15)
AST: 20 U/L (ref 15–41)
Albumin: 3.8 g/dL (ref 3.5–5.0)
Alkaline Phosphatase: 47 U/L (ref 38–126)
BUN: 7 mg/dL (ref 6–20)
CHLORIDE: 103 mmol/L (ref 101–111)
CO2: 24 mmol/L (ref 22–32)
Calcium: 9.4 mg/dL (ref 8.9–10.3)
Creatinine, Ser: 0.62 mg/dL (ref 0.44–1.00)
Glucose, Bld: 72 mg/dL (ref 65–99)
POTASSIUM: 4.3 mmol/L (ref 3.5–5.1)
Sodium: 133 mmol/L — ABNORMAL LOW (ref 135–145)
Total Bilirubin: 0.5 mg/dL (ref 0.3–1.2)
Total Protein: 7.4 g/dL (ref 6.5–8.1)

## 2016-06-15 LAB — CBC
HEMATOCRIT: 38.6 % (ref 36.0–46.0)
HEMOGLOBIN: 13.4 g/dL (ref 12.0–15.0)
MCH: 29.3 pg (ref 26.0–34.0)
MCHC: 34.7 g/dL (ref 30.0–36.0)
MCV: 84.3 fL (ref 78.0–100.0)
Platelets: 331 10*3/uL (ref 150–400)
RBC: 4.58 MIL/uL (ref 3.87–5.11)
RDW: 13.5 % (ref 11.5–15.5)
WBC: 15.9 10*3/uL — ABNORMAL HIGH (ref 4.0–10.5)

## 2016-06-15 LAB — URINALYSIS, ROUTINE W REFLEX MICROSCOPIC
Bilirubin Urine: NEGATIVE
GLUCOSE, UA: NEGATIVE mg/dL
HGB URINE DIPSTICK: NEGATIVE
Ketones, ur: NEGATIVE mg/dL
LEUKOCYTES UA: NEGATIVE
Nitrite: NEGATIVE
PH: 6 (ref 5.0–8.0)
PROTEIN: NEGATIVE mg/dL
SPECIFIC GRAVITY, URINE: 1.02 (ref 1.005–1.030)

## 2016-06-15 MED ORDER — PROMETHAZINE HCL 25 MG/ML IJ SOLN
12.5000 mg | Freq: Once | INTRAMUSCULAR | Status: AC
  Administered 2016-06-15: 17:00:00 via INTRAVENOUS
  Filled 2016-06-15: qty 1

## 2016-06-15 MED ORDER — METOCLOPRAMIDE HCL 10 MG PO TABS: 10.0000 mg | ORAL_TABLET | Freq: Four times a day (QID) | ORAL | 2 refills | Status: DC | PRN

## 2016-06-15 MED ORDER — LACTATED RINGERS IV BOLUS (SEPSIS)
1000.0000 mL | Freq: Once | INTRAVENOUS | Status: AC
  Administered 2016-06-15: 1000 mL via INTRAVENOUS

## 2016-06-15 NOTE — Discharge Instructions (Signed)

## 2016-06-15 NOTE — MAU Note (Signed)
Pt reports she has been nauseated for days . Last time she vomited was yesterday. Has Antiemetics prescribed but it did not work so she stopped taking it after a few times the first time it was prescribed.

## 2016-06-15 NOTE — MAU Provider Note (Signed)
S: Erica Mack is a 30 yo black female [phone] at 8 wks here in MAU with 3 days of nausea. She has vomited twice after she tried to eat. Her PO intake has been poor. She says she had similar nausea during previous pregnancies. She also endorses generalized pelvic pain, achy lower back pain, and urinary frequency. She has taken Tylenol for pain relief. She has been prescribed Phenergan but cannot keep it down and would like to try something else. She will see Dr. Clearance CootsHarper for her 1st prenatal care visit on October 19.  O: Blood pressure 125/66, pulse 86, temperature 98.18F (36.9C), temperature source Oral, resp. rate 18, weight 211lb (95.8kg).  Physical Exam Constitutional: She is oriented to person, place, and time. She appears well-developedand well-nourished. No distress.  Cv: RRR, no mrg Lungs: CTAB GI: Soft. Normal appearance. There is no rigidity, no reboundand no guarding.   A: Nausea of first trimester --Dehydration  P: --Discharge to home in stable condition --Phenergan 12.5 mg IV for nausea --LR 1L IV fluid bolus tolerated well --Reglan 10mg  q6 prn for nausea --Counseled on diet recommendations to minimize N/V

## 2016-06-26 ENCOUNTER — Inpatient Hospital Stay (HOSPITAL_COMMUNITY)
Admission: AD | Admit: 2016-06-26 | Discharge: 2016-06-27 | Disposition: A | Discharge status: Home / Self Care | Payer: Medicaid Other | Source: Ambulatory Visit | Attending: Obstetrics and Gynecology | Admitting: Obstetrics and Gynecology

## 2016-06-26 ENCOUNTER — Encounter (HOSPITAL_COMMUNITY): Payer: Self-pay

## 2016-06-26 DIAGNOSIS — Z3A09 9 weeks gestation of pregnancy: Secondary | ICD-10-CM | POA: Diagnosis not present

## 2016-06-26 DIAGNOSIS — Z87891 Personal history of nicotine dependence: Secondary | ICD-10-CM | POA: Diagnosis not present

## 2016-06-26 DIAGNOSIS — O210 Mild hyperemesis gravidarum: Secondary | ICD-10-CM | POA: Diagnosis present

## 2016-06-26 DIAGNOSIS — O219 Vomiting of pregnancy, unspecified: Secondary | ICD-10-CM

## 2016-06-26 LAB — URINALYSIS, ROUTINE W REFLEX MICROSCOPIC
BILIRUBIN URINE: NEGATIVE
GLUCOSE, UA: NEGATIVE mg/dL
HGB URINE DIPSTICK: NEGATIVE
KETONES UR: 15 mg/dL — AB
LEUKOCYTES UA: NEGATIVE
NITRITE: NEGATIVE
PROTEIN: NEGATIVE mg/dL
Specific Gravity, Urine: >1.03 — ABNORMAL HIGH (ref 1.005–1.030)
pH: 6 (ref 5.0–8.0)

## 2016-06-26 MED ORDER — PROMETHAZINE HCL 25 MG/ML IJ SOLN
25.0000 mg | Freq: Once | INTRAMUSCULAR | Status: AC
  Administered 2016-06-26: 25 mg via INTRAMUSCULAR
  Filled 2016-06-26: qty 1

## 2016-06-26 NOTE — MAU Note (Signed)
Pt reports she is unable to keep anything down x one week, has been taking the reglan but it doesn't help. Also reports lower abd pain for 2-3 days, denies bleeding.

## 2016-06-26 NOTE — MAU Provider Note (Signed)
History     CSN: [id number]  Arrival date and time: 06/26/16 1943   First Provider Initiated Contact with Patient 06/26/16 2323      Chief Complaint  Patient presents with  . Emesis   Emesis   This is a new problem. The current episode started 1 to 4 weeks ago. The problem occurs 2 to 4 times per day. The problem has been unchanged. The emesis has an appearance of stomach contents. There has been no fever. Associated symptoms include abdominal pain. Pertinent negatives include no chills, diarrhea or fever. Risk factors: pregnancy  Treatments tried: reglan, phenergan  The treatment provided no relief.    Past Medical History:  Diagnosis Date  . Chlamydia contact, treated   . Gonorrhea   . Headache(784.0)   . Hypertension    no meds; dx 2008    Past Surgical History:  Procedure Laterality Date  . DILATION AND CURETTAGE OF UTERUS    . INDUCED ABORTION      Family History  Problem Relation Age of Onset  . Hypertension Mother   . Diabetes Brother   . Hypertension Maternal Aunt   . Cancer Maternal Grandmother   . Hypertension Maternal Grandmother   . Anesthesia problems Neg Hx     Social History  Substance Use Topics  . Smoking status: Former Games developermoker  . Smokeless tobacco: Never Used  . Alcohol use 3.3 oz/week    3 Shots of liquor, 3 Standard drinks or equivalent per week     Comment: occasional alcohol    Allergies:  Allergies  Allergen Reactions  . Aspirin Itching    Prescriptions Prior to Admission  Medication Sig Dispense Refill Last Dose  . metoCLOPramide (REGLAN) 10 MG tablet Take 1 tablet (10 mg total) by mouth 4 (four) times daily as needed for nausea or vomiting. 30 tablet 2 06/26/2016 at Unknown time  . albuterol (PROVENTIL HFA;VENTOLIN HFA) 108 (90 Base) MCG/ACT inhaler Inhale 3 puffs into the lungs every 6 (six) hours as needed for wheezing or shortness of breath.   Past Month at Unknown time  . diphenhydramine-acetaminophen (TYLENOL PM) 25-500 MG TABS  tablet Take 1 tablet by mouth at bedtime as needed.   06/14/2016 at Unknown time  . Prenatal Vit-Fe Fumarate-FA (PRENATAL MULTIVITAMIN) TABS tablet Take 1 tablet by mouth daily at 12 noon.   Past Week at Unknown time    Review of Systems  Constitutional: Negative for chills and fever.  Gastrointestinal: Positive for abdominal pain, nausea and vomiting. Negative for constipation and diarrhea.  Genitourinary: Negative for dysuria, frequency and urgency.   Physical Exam   Blood pressure 124/70, pulse 81, temperature 98.8 F (37.1 C), temperature source Oral, resp. rate 16, height 5\' 6"  (1.676 m), weight 210 lb (95.3 kg), last menstrual period 04/25/2016, SpO2 99 %.  Physical Exam  Nursing note and vitals reviewed. Constitutional: She is oriented to person, place, and time. She appears well-developed and well-nourished. No distress.  HENT:  Head: Normocephalic.  Cardiovascular: Normal rate.   Respiratory: Effort normal.  GI: Soft. There is no tenderness. There is no rebound.  Neurological: She is alert and oriented to person, place, and time.  Skin: Skin is warm and dry.  Psychiatric: She has a normal mood and affect.   Results for orders placed or performed during the hospital encounter of 06/26/16 (from the past 24 hour(s))  Urinalysis, Routine w reflex microscopic ([facility])     Status: Abnormal   Collection Time: 06/26/16  8:00 PM  Result Value Ref Range   Color, Urine YELLOW YELLOW   APPearance CLEAR CLEAR   Specific Gravity, Urine >1.030 (H) 1.005 - 1.030   pH 6.0 5.0 - 8.0   Glucose, UA NEGATIVE NEGATIVE mg/dL   Hgb urine dipstick NEGATIVE NEGATIVE   Bilirubin Urine NEGATIVE NEGATIVE   Ketones, ur 15 (A) NEGATIVE mg/dL   Protein, ur NEGATIVE NEGATIVE mg/dL   Nitrite NEGATIVE NEGATIVE   Leukocytes, UA NEGATIVE NEGATIVE    MAU Course  Procedures  MDM 2333: Patient is requesting an [place]. Patient advised that she has had 2 USs and that nausea/vomiting is not an  indication for [place]. Patient became belligerent, and began yelling insults. She insists that she never had a second [place] on 9/6 and that the nurses never took her [place]. There is an [place] resulted in the computer for 9/6. I offered to show the patient the images from that day. She continued to yell and insist that she did not have an US done on that day. Security called to be on the unit.   2441: Patient has had phenergan here. She reports that she is feeling better. She is tolerating PO at this time.   Assessment and Plan   1. Nausea and vomiting during pregnancy prior to [redacted] weeks gestation    DC home Comfort measures reviewed  1st Trimester precautions  RX: zofran PRN #[id number]  Return to MAU as needed FU with OB as planned  Follow-up Information    HARPER,CHARLES A, MD .   Specialty:  Obstetrics and Gynecology Contact information: [address] [place] [address] [phone]            Tawnya Crook 06/26/2016, 11:24 PM

## 2016-06-27 DIAGNOSIS — O219 Vomiting of pregnancy, unspecified: Secondary | ICD-10-CM

## 2016-06-27 MED ORDER — ONDANSETRON 8 MG PO TBDP: 8.0000 mg | ORAL_TABLET | Freq: Three times a day (TID) | ORAL | 0 refills | Status: DC | PRN

## 2016-06-27 NOTE — Discharge Instructions (Signed)

## 2016-07-10 ENCOUNTER — Ambulatory Visit (INDEPENDENT_AMBULATORY_CARE_PROVIDER_SITE_OTHER): Payer: Medicaid Other | Admitting: Obstetrics and Gynecology

## 2016-07-10 ENCOUNTER — Encounter: Payer: Self-pay | Admitting: Obstetrics and Gynecology

## 2016-07-10 DIAGNOSIS — Z3490 Encounter for supervision of normal pregnancy, unspecified, unspecified trimester: Secondary | ICD-10-CM | POA: Insufficient documentation

## 2016-07-10 DIAGNOSIS — Z3401 Encounter for supervision of normal first pregnancy, first trimester: Secondary | ICD-10-CM

## 2016-07-10 DIAGNOSIS — Z8669 Personal history of other diseases of the nervous system and sense organs: Secondary | ICD-10-CM | POA: Diagnosis not present

## 2016-07-10 MED ORDER — PRENATAL MULTIVITAMIN CH: 1.0000 | ORAL_TABLET | Freq: Every day | ORAL | 11 refills | Status: DC

## 2016-07-10 NOTE — Progress Notes (Signed)
Patient is in office for initial ob visit. Pt. states that she has been having headaches that are not relived by Tylenol.

## 2016-07-10 NOTE — Progress Notes (Signed)
Subjective:  Erica BergeronMarkisa L Tanzi is a 30 y.o. [id number] at 8297w5d being seen today for initial OB visit. She reports N/V is improving. H/O migraine Ha. TSVD x 2 in the past without problems.   She is currently monitored for the following issues for this low-risk pregnancy and has Supervision of low-risk first pregnancy and History of migraine on her problem list.  Patient reports headache.  Contractions: Not present. Vag. Bleeding: None.   . Denies leaking of fluid.   The following portions of the patient's history were reviewed and updated as appropriate: allergies, current medications, past family history, past medical history, past social history, past surgical history and problem list. Problem list updated.  Objective:   Vitals:   07/10/16 1600  BP: 131/84  Pulse: 97  Temp: 98.8 F (37.1 C)  Weight: 212 lb 9.6 oz (96.4 kg)    Fetal Status:           General:  Alert, oriented and cooperative. Patient is in no acute distress.  Skin: Skin is warm and dry. No rash noted.   Cardiovascular: Normal heart rate noted  Respiratory: Normal respiratory effort, no problems with respiration noted  Abdomen: Soft, gravid, appropriate for gestational age. Pain/Pressure: Present     Pelvic:  Cervical exam deferred        Extremities: Normal range of motion.  Edema: Trace  Mental Status: Normal mood and affect. Normal behavior. Normal judgment and thought content.   Urinalysis: Urine Protein: Negative Urine Glucose: Negative  Assessment and Plan:  Pregnancy: [id number] at 5797w5d  1. Encounter for supervision of low-risk first pregnancy in first trimester Declined flu vaccine - Prenatal Vit-Fe Fumarate-FA (PRENATAL MULTIVITAMIN) TABS tablet; Take 1 tablet by mouth daily at 12 noon.  Dispense: 30 tablet; Refill: 11 - HIV antibody - Hemoglobinopathy evaluation - Varicella zoster antibody, IgG - Prenatal Profile I - Culture, OB Urine - ToxASSURE Select 13 (MW), Urine U/S for NT 2. History of  migraine   Preterm labor symptoms and general obstetric precautions including but not limited to vaginal bleeding, contractions, leaking of fluid and fetal movement were reviewed in detail with the patient. Please refer to After Visit Summary for other counseling recommendations.  No Follow-up on file.   Hermina StaggersMichael L Lion Fernandez, MD

## 2016-07-10 NOTE — Patient Instructions (Signed)
First Trimester of Pregnancy The first trimester of pregnancy is from week 1 until the end of week 12 (months 1 through 3). A week after a sperm fertilizes an egg, the egg will implant on the wall of the uterus. This embryo will begin to develop into a baby. Genes from you and your partner are forming the baby. The female genes determine whether the baby is a boy or a girl. At 6-8 weeks, the eyes and face are formed, and the heartbeat can be seen on ultrasound. At the end of 12 weeks, all the baby's organs are formed.  Now that you are pregnant, you will want to do everything you can to have a healthy baby. Two of the most important things are to get good prenatal care and to follow your health care provider's instructions. Prenatal care is all the medical care you receive before the baby's birth. This care will help prevent, find, and treat any problems during the pregnancy and childbirth. BODY CHANGES Your body goes through many changes during pregnancy. The changes vary from woman to woman.   You may gain or lose a couple of pounds at first.  You may feel sick to your stomach (nauseous) and throw up (vomit). If the vomiting is uncontrollable, call your health care provider.  You may tire easily.  You may develop headaches that can be relieved by medicines approved by your health care provider.  You may urinate more often. Painful urination may mean you have a bladder infection.  You may develop heartburn as a result of your pregnancy.  You may develop constipation because certain hormones are causing the muscles that push waste through your intestines to slow down.  You may develop hemorrhoids or swollen, bulging veins (varicose veins).  Your breasts may begin to grow larger and become tender. Your nipples may stick out more, and the tissue that surrounds them (areola) may become darker.  Your gums may bleed and may be sensitive to brushing and flossing.  Dark spots or blotches (chloasma,  mask of pregnancy) may develop on your face. This will likely fade after the baby is born.  Your menstrual periods will stop.  You may have a loss of appetite.  You may develop cravings for certain kinds of food.  You may have changes in your emotions from day to day, such as being excited to be pregnant or being concerned that something may go wrong with the pregnancy and baby.  You may have more vivid and strange dreams.  You may have changes in your hair. These can include thickening of your hair, rapid growth, and changes in texture. Some women also have hair loss during or after pregnancy, or hair that feels dry or thin. Your hair will most likely return to normal after your baby is born. WHAT TO EXPECT AT YOUR PRENATAL VISITS During a routine prenatal visit:  You will be weighed to make sure you and the baby are growing normally.  Your blood pressure will be taken.  Your abdomen will be measured to track your baby's growth.  The fetal heartbeat will be listened to starting around week 10 or 12 of your pregnancy.  Test results from any previous visits will be discussed. Your health care provider may ask you:  How you are feeling.  If you are feeling the baby move.  If you have had any abnormal symptoms, such as leaking fluid, bleeding, severe headaches, or abdominal cramping.  If you are using any tobacco products,   including cigarettes, chewing tobacco, and electronic cigarettes.  If you have any questions. Other tests that may be performed during your first trimester include:  Blood tests to find your blood type and to check for the presence of any previous infections. They will also be used to check for low iron levels (anemia) and Rh antibodies. Later in the pregnancy, blood tests for diabetes will be done along with other tests if problems develop.  Urine tests to check for infections, diabetes, or protein in the urine.  An ultrasound to confirm the proper growth  and development of the baby.  An amniocentesis to check for possible genetic problems.  Fetal screens for spina bifida and Down syndrome.  You may need other tests to make sure you and the baby are doing well.  HIV (human immunodeficiency virus) testing. Routine prenatal testing includes screening for HIV, unless you choose not to have this test. HOME CARE INSTRUCTIONS  Medicines  Follow your health care provider's instructions regarding medicine use. Specific medicines may be either safe or unsafe to take during pregnancy.  Take your prenatal vitamins as directed.  If you develop constipation, try taking a stool softener if your health care provider approves. Diet  Eat regular, well-balanced meals. Choose a variety of foods, such as meat or vegetable-based protein, fish, milk and low-fat dairy products, vegetables, fruits, and whole grain breads and cereals. Your health care provider will help you determine the amount of weight gain that is right for you.  Avoid raw meat and uncooked cheese. These carry germs that can cause birth defects in the baby.  Eating four or five small meals rather than three large meals a day may help relieve nausea and vomiting. If you start to feel nauseous, eating a few soda crackers can be helpful. Drinking liquids between meals instead of during meals also seems to help nausea and vomiting.  If you develop constipation, eat more high-fiber foods, such as fresh vegetables or fruit and whole grains. Drink enough fluids to keep your urine clear or pale yellow. Activity and Exercise  Exercise only as directed by your health care provider. Exercising will help you:  Control your weight.  Stay in shape.  Be prepared for labor and delivery.  Experiencing pain or cramping in the lower abdomen or low back is a good sign that you should stop exercising. Check with your health care provider before continuing normal exercises.  Try to avoid standing for long  periods of time. Move your legs often if you must stand in one place for a long time.  Avoid heavy lifting.  Wear low-heeled shoes, and practice good posture.  You may continue to have sex unless your health care provider directs you otherwise. Relief of Pain or Discomfort  Wear a good support bra for breast tenderness.   Take warm sitz baths to soothe any pain or discomfort caused by hemorrhoids. Use hemorrhoid cream if your health care provider approves.   Rest with your legs elevated if you have leg cramps or low back pain.  If you develop varicose veins in your legs, wear support hose. Elevate your feet for 15 minutes, 3-4 times a day. Limit salt in your diet. Prenatal Care  Schedule your prenatal visits by the twelfth week of pregnancy. They are usually scheduled monthly at first, then more often in the last 2 months before delivery.  Write down your questions. Take them to your prenatal visits.  Keep all your prenatal visits as directed by your   health care provider. Safety  Wear your seat belt at all times when driving.  Make a list of emergency phone numbers, including numbers for family, friends, the hospital, and police and fire departments. General Tips  Ask your health care provider for a referral to a local prenatal education class. Begin classes no later than at the beginning of month 6 of your pregnancy.  Ask for help if you have counseling or nutritional needs during pregnancy. Your health care provider can offer advice or refer you to specialists for help with various needs.  Do not use hot tubs, steam rooms, or saunas.  Do not douche or use tampons or scented sanitary pads.  Do not cross your legs for long periods of time.  Avoid cat litter boxes and soil used by cats. These carry germs that can cause birth defects in the baby and possibly loss of the fetus by miscarriage or stillbirth.  Avoid all smoking, herbs, alcohol, and medicines not prescribed by  your health care provider. Chemicals in these affect the formation and growth of the baby.  Do not use any tobacco products, including cigarettes, chewing tobacco, and electronic cigarettes. If you need help quitting, ask your health care provider. You may receive counseling support and other resources to help you quit.  Schedule a dentist appointment. At home, brush your teeth with a soft toothbrush and be gentle when you floss. SEEK MEDICAL CARE IF:   You have dizziness.  You have mild pelvic cramps, pelvic pressure, or nagging pain in the abdominal area.  You have persistent nausea, vomiting, or diarrhea.  You have a bad smelling vaginal discharge.  You have pain with urination.  You notice increased swelling in your face, hands, legs, or ankles. SEEK IMMEDIATE MEDICAL CARE IF:   You have a fever.  You are leaking fluid from your vagina.  You have spotting or bleeding from your vagina.  You have severe abdominal cramping or pain.  You have rapid weight gain or loss.  You vomit blood or material that looks like coffee grounds.  You are exposed to German measles and have never had them.  You are exposed to fifth disease or chickenpox.  You develop a severe headache.  You have shortness of breath.  You have any kind of trauma, such as from a fall or a car accident.   This information is not intended to replace advice given to you by your health care provider. Make sure you discuss any questions you have with your health care provider.   Document Released: 09/05/2001 Document Revised: 10/02/2014 Document Reviewed: 07/22/2013 Elsevier Interactive Patient Education 2016 [employer]  

## 2016-07-10 NOTE — Addendum Note (Signed)
Addended by: Pennie BanterSMITH, MARNI W on: 07/10/2016 04:56 PM   Modules accepted: Orders

## 2016-07-11 ENCOUNTER — Encounter: Payer: Self-pay | Admitting: Obstetrics and Gynecology

## 2016-07-12 ENCOUNTER — Encounter (HOSPITAL_COMMUNITY): Payer: Self-pay | Admitting: Obstetrics and Gynecology

## 2016-07-12 LAB — URINE CULTURE, OB REFLEX

## 2016-07-12 LAB — CULTURE, OB URINE

## 2016-07-17 LAB — PRENATAL PROFILE I(LABCORP)
ANTIBODY SCREEN: NEGATIVE
BASOS: 0 %
Basophils Absolute: 0 10*3/uL (ref 0.0–0.2)
EOS (ABSOLUTE): 0 10*3/uL (ref 0.0–0.4)
EOS: 0 %
HEMATOCRIT: 37.5 % (ref 34.0–46.6)
HEMOGLOBIN: 12.7 g/dL (ref 11.1–15.9)
HEP B S AG: NEGATIVE
IMMATURE GRANS (ABS): 0 10*3/uL (ref 0.0–0.1)
IMMATURE GRANULOCYTES: 0 %
LYMPHS: 22 %
Lymphocytes Absolute: 3.1 10*3/uL (ref 0.7–3.1)
MCH: 28.9 pg (ref 26.6–33.0)
MCHC: 33.9 g/dL (ref 31.5–35.7)
MCV: 85 fL (ref 79–97)
MONOCYTES: 6 %
MONOS ABS: 0.9 10*3/uL (ref 0.1–0.9)
NEUTROS PCT: 72 %
Neutrophils Absolute: 9.9 10*3/uL — ABNORMAL HIGH (ref 1.4–7.0)
Platelets: 298 10*3/uL (ref 150–379)
RBC: 4.4 x10E6/uL (ref 3.77–5.28)
RDW: 14.3 % (ref 12.3–15.4)
RH TYPE: POSITIVE
RPR: NONREACTIVE
RUBELLA: 7.07 {index} (ref >0.99)
WBC: 14 10*3/uL — ABNORMAL HIGH (ref 3.4–10.8)

## 2016-07-17 LAB — HEMOGLOBINOPATHY EVALUATION
HEMOGLOBIN A2 QUANTITATION: 2.6 % (ref 0.7–3.1)
HEMOGLOBIN F QUANTITATION: 0 % (ref 0.0–2.0)
HGB C: 0 %
HGB S: 0 %
Hgb A: 97.4 % (ref 94.0–98.0)

## 2016-07-17 LAB — TOXASSURE SELECT 13 (MW), URINE

## 2016-07-17 LAB — HIV ANTIBODY (ROUTINE TESTING W REFLEX): HIV Screen 4th Generation wRfx: NONREACTIVE

## 2016-07-17 LAB — VARICELLA ZOSTER ANTIBODY, IGG: VARICELLA: 1927 {index} (ref >165)

## 2016-07-19 ENCOUNTER — Ambulatory Visit (HOSPITAL_COMMUNITY): Payer: No Typology Code available for payment source

## 2016-07-21 ENCOUNTER — Ambulatory Visit (HOSPITAL_COMMUNITY)
Admission: RE | Admit: 2016-07-21 | Discharge: 2016-07-21 | Disposition: A | Discharge status: Home / Self Care | Payer: Medicaid Other | Source: Ambulatory Visit | Attending: Obstetrics and Gynecology | Admitting: Obstetrics and Gynecology

## 2016-07-21 ENCOUNTER — Encounter (HOSPITAL_COMMUNITY): Payer: Self-pay

## 2016-07-21 ENCOUNTER — Other Ambulatory Visit: Payer: Self-pay | Admitting: Obstetrics and Gynecology

## 2016-07-21 DIAGNOSIS — Z3A13 13 weeks gestation of pregnancy: Secondary | ICD-10-CM | POA: Diagnosis not present

## 2016-07-21 DIAGNOSIS — O169 Unspecified maternal hypertension, unspecified trimester: Secondary | ICD-10-CM

## 2016-07-21 DIAGNOSIS — Z3401 Encounter for supervision of normal first pregnancy, first trimester: Secondary | ICD-10-CM

## 2016-07-21 DIAGNOSIS — Z3682 Encounter for antenatal screening for nuchal translucency: Secondary | ICD-10-CM | POA: Insufficient documentation

## 2016-07-21 DIAGNOSIS — O161 Unspecified maternal hypertension, first trimester: Secondary | ICD-10-CM | POA: Insufficient documentation

## 2016-07-31 ENCOUNTER — Other Ambulatory Visit (HOSPITAL_COMMUNITY): Payer: Self-pay

## 2016-07-31 ENCOUNTER — Other Ambulatory Visit (HOSPITAL_COMMUNITY): Payer: Self-pay | Admitting: Radiology

## 2016-08-07 ENCOUNTER — Ambulatory Visit (INDEPENDENT_AMBULATORY_CARE_PROVIDER_SITE_OTHER): Payer: Medicaid Other | Admitting: Obstetrics and Gynecology

## 2016-08-07 DIAGNOSIS — Z3402 Encounter for supervision of normal first pregnancy, second trimester: Secondary | ICD-10-CM

## 2016-08-07 NOTE — Progress Notes (Signed)
   PRENATAL VISIT NOTE  Subjective:  Erica BergeronMarkisa L Mack is a 30 y.o. [phone] at 4962w5d being seen today for ongoing prenatal care.  She is currently monitored for the following issues for this low-risk pregnancy and has Supervision of low-risk first pregnancy and History of migraine on her problem list.  Patient reports vaginal irritation.  Contractions: Not present. Vag. Bleeding: None.  Movement: Present. Denies leaking of fluid.   The following portions of the patient's history were reviewed and updated as appropriate: allergies, current medications, past family history, past medical history, past social history, past surgical history and problem list. Problem list updated.  Objective:   Vitals:   08/07/16 1556  BP: 127/81  Pulse: 97  Weight: 208 lb (94.3 kg)    Fetal Status: Fetal Heart Rate (bpm): 160   Movement: Present     General:  Alert, oriented and cooperative. Patient is in no acute distress.  Skin: Skin is warm and dry. No rash noted.   Cardiovascular: Normal heart rate noted  Respiratory: Normal respiratory effort, no problems with respiration noted  Abdomen: Soft, gravid, appropriate for gestational age. Pain/Pressure: Present     Pelvic:  Cervical exam deferred        Extremities: Normal range of motion.     Mental Status: Normal mood and affect. Normal behavior. Normal judgment and thought content.   Assessment and Plan:  Pregnancy: [id number] at 2762w5d  1. Encounter for supervision of low-risk first pregnancy in second trimester Patient is doing well Wet prep collected Patient will be notified of abnormal results Anatomy ultrasound ordered - US OB Comp + 14 Wk; Future - NuSwab VG, Candida 6sp  General obstetric precautions including but not limited to vaginal bleeding, contractions, leaking of fluid and fetal movement were reviewed in detail with the patient. Please refer to After Visit Summary for other counseling recommendations.  Return in about 4 weeks  (around 09/04/2016) for ROB.   Catalina AntiguaPeggy Travaris Kosh, MD

## 2016-08-10 ENCOUNTER — Other Ambulatory Visit: Payer: Self-pay | Admitting: Obstetrics and Gynecology

## 2016-08-10 LAB — NUSWAB VG, CANDIDA 6SP
CANDIDA GLABRATA, NAA: NEGATIVE
CANDIDA KRUSEI, NAA: NEGATIVE
CANDIDA LUSITANIAE, NAA: NEGATIVE
CANDIDA PARAPSILOSIS, NAA: NEGATIVE
Candida albicans, NAA: POSITIVE — AB
Candida tropicalis, NAA: NEGATIVE
TRICH VAG BY NAA: NEGATIVE

## 2016-08-10 MED ORDER — FLUCONAZOLE 150 MG PO TABS: 150.0000 mg | ORAL_TABLET | Freq: Once | ORAL | 0 refills | Status: AC

## 2016-08-11 ENCOUNTER — Telehealth: Payer: Self-pay | Admitting: *Deleted

## 2016-08-11 DIAGNOSIS — B373 Candidiasis of vulva and vagina: Secondary | ICD-10-CM

## 2016-08-11 DIAGNOSIS — B3731 Acute candidiasis of vulva and vagina: Secondary | ICD-10-CM

## 2016-08-11 MED ORDER — FLUCONAZOLE 150 MG PO TABS: 150.0000 mg | ORAL_TABLET | Freq: Once | ORAL | 0 refills | Status: AC

## 2016-08-11 NOTE — Telephone Encounter (Signed)
Patient notified Rx sent

## 2016-08-11 NOTE — Telephone Encounter (Signed)
-----   Message from Catalina AntiguaPeggy Constant, MD sent at 08/10/2016 10:41 AM EST ----- Please inform patient of yeast infection. Diflucan could not be e-prescribed but order is in Epic. Please call it in to her pharmacy of choice  Thanks  Gigi Gineggy

## 2016-08-22 ENCOUNTER — Inpatient Hospital Stay (HOSPITAL_COMMUNITY)
Admission: AD | Admit: 2016-08-22 | Discharge: 2016-08-22 | Disposition: A | Discharge status: Home / Self Care | Payer: Medicaid Other | Source: Ambulatory Visit | Attending: Obstetrics & Gynecology | Admitting: Obstetrics & Gynecology

## 2016-08-22 ENCOUNTER — Encounter (HOSPITAL_COMMUNITY): Payer: Self-pay | Admitting: *Deleted

## 2016-08-22 DIAGNOSIS — M545 Low back pain, unspecified: Secondary | ICD-10-CM

## 2016-08-22 DIAGNOSIS — Z79899 Other long term (current) drug therapy: Secondary | ICD-10-CM | POA: Insufficient documentation

## 2016-08-22 DIAGNOSIS — Z3A18 18 weeks gestation of pregnancy: Secondary | ICD-10-CM | POA: Diagnosis not present

## 2016-08-22 DIAGNOSIS — M25552 Pain in left hip: Secondary | ICD-10-CM | POA: Diagnosis not present

## 2016-08-22 DIAGNOSIS — O26892 Other specified pregnancy related conditions, second trimester: Secondary | ICD-10-CM | POA: Diagnosis not present

## 2016-08-22 DIAGNOSIS — M25551 Pain in right hip: Secondary | ICD-10-CM | POA: Diagnosis not present

## 2016-08-22 DIAGNOSIS — O26899 Other specified pregnancy related conditions, unspecified trimester: Secondary | ICD-10-CM

## 2016-08-22 DIAGNOSIS — R102 Pelvic and perineal pain: Secondary | ICD-10-CM

## 2016-08-22 LAB — URINALYSIS, ROUTINE W REFLEX MICROSCOPIC
Bilirubin Urine: NEGATIVE
GLUCOSE, UA: NEGATIVE mg/dL
HGB URINE DIPSTICK: NEGATIVE
KETONES UR: NEGATIVE mg/dL
Leukocytes, UA: NEGATIVE
Nitrite: NEGATIVE
PROTEIN: NEGATIVE mg/dL
Specific Gravity, Urine: 1.01 (ref 1.005–1.030)
pH: 6 (ref 5.0–8.0)

## 2016-08-22 MED ORDER — CYCLOBENZAPRINE HCL 5 MG PO TABS: 5.0000 mg | ORAL_TABLET | Freq: Three times a day (TID) | ORAL | 0 refills | Status: DC | PRN

## 2016-08-22 NOTE — MAU Provider Note (Signed)
Chief Complaint:  Back Pain   First Provider Initiated Contact with Patient 08/22/16 1948     HPI: Erica Mack is a 30 y.o. [id number] at 11017w6dwho presents to maternity admissions reporting pain in bilateral hips and lower back. Hurts to move or walk or stand.  Hurts when turns over in bed.  Makes vagina and pelvis feel heavy.  No bleeding. . She reports good fetal movement, denies LOF, vaginal bleeding, vaginal itching/burning, urinary symptoms, h/a, dizziness, n/v, diarrhea, constipation or fever/chills.  She denies headache, visual changes or RUQ abdominal pain.  Back Pain  This is a recurrent problem. The current episode started 1 to 4 weeks ago. The problem occurs intermittently. The problem has been waxing and waning since onset. The pain is present in the lumbar spine (hips bilat). The quality of the pain is described as aching and cramping. The pain does not radiate. The pain is moderate. The pain is the same all the time. The symptoms are aggravated by bending, lying down, position, standing and twisting. Stiffness is present all day. Pertinent negatives include no abdominal pain, fever, headaches, leg pain, paresis, perianal numbness, tingling or weakness. She has tried nothing for the symptoms.   RN Note: PT  SAYS  SINCE  SAT  SHE HAS BEEN  HAVING  LOWER BACK  AND  BOTH HIPS  PAIN  AND   VAG  FEELS  HEAVY   .  DX WITH YEAST   INFECTION-    RESOLVED.   PNC-   FAMINA.   LAST SEX- 2 WEEKS  AGO  Past Medical History: Past Medical History:  Diagnosis Date  . Chlamydia contact, treated   . Gonorrhea   . Headache(784.0)   . Hypertension    no meds; dx 2008    Past obstetric history: OB History  Gravida Para Term Preterm AB Living  5 2 2   2 2   SAB TAB Ectopic Multiple Live Births    2     2    # Outcome Date GA Lbr Len/2nd Weight Sex Delivery Anes PTL Lv  5 Current           4 Term 09/05/09 3053w0d  6 lb 4 oz (2.835 kg) F Vag-Spont EPI  LIV     Birth Comments: hyperemesis   3  Term 10/25/07 7553w0d  4 lb 11 oz (2.126 kg) M Vag-Spont EPI  LIV     Birth Comments: No complications  2 TAB           1 TAB               Past Surgical History: Past Surgical History:  Procedure Laterality Date  . DILATION AND CURETTAGE OF UTERUS    . INDUCED ABORTION      Family History: Family History  Problem Relation Age of Onset  . Hypertension Mother   . Diabetes Brother   . Hypertension Maternal Aunt   . Heart disease Maternal Aunt   . Cancer Maternal Grandmother   . Hypertension Maternal Grandmother   . Anesthesia problems Neg Hx     Social History: Social History  Substance Use Topics  . Smoking status: Never Smoker  . Smokeless tobacco: Never Used  . Alcohol use 3.3 oz/week    3 Shots of liquor, 3 Standard drinks or equivalent per week     Comment: occasional alcohol    Allergies:  Allergies  Allergen Reactions  . Aspirin Itching    Meds:  Prescriptions Prior  to Admission  Medication Sig Dispense Refill Last Dose  . albuterol (PROVENTIL HFA;VENTOLIN HFA) 108 (90 Base) MCG/ACT inhaler Inhale 3 puffs into the lungs every 6 (six) hours as needed for wheezing or shortness of breath.   Not Taking  . diphenhydramine-acetaminophen (TYLENOL PM) 25-500 MG TABS tablet Take 1 tablet by mouth at bedtime as needed.   Not Taking  . ondansetron (ZOFRAN ODT) 8 MG disintegrating tablet Take 1 tablet (8 mg total) by mouth every 8 (eight) hours as needed for nausea or vomiting. (Patient not taking: Reported on 08/07/2016) 20 tablet 0 Not Taking  . Prenatal Vit-Fe Fumarate-FA (PRENATAL MULTIVITAMIN) TABS tablet Take 1 tablet by mouth daily at 12 noon. 30 tablet 11 Taking    I have reviewed patient's Past Medical Hx, Surgical Hx, Family Hx, Social Hx, medications and allergies.   ROS:  Review of Systems  Constitutional: Negative for fever.  Gastrointestinal: Negative for abdominal pain.  Musculoskeletal: Positive for back pain.  Neurological: Negative for tingling,  weakness and headaches.   Other systems negative  Physical Exam  Patient Vitals for the past 24 hrs:  BP Temp Temp src Pulse Resp Height Weight  08/22/16 1939 117/66 99.3 F (37.4 C) Oral 92 20 5\' 6"  (1.676 m) 214 lb (97.1 kg)   Constitutional: Well-developed, well-nourished female in no acute distress.  Cardiovascular: normal rate and rhythm Respiratory: normal effort, clear to auscultation bilaterally GI: Abd soft, non-tender, gravid appropriate for gestational age.   No rebound or guarding. MS: Extremities nontender, no edema, normal ROM   Hips tender to compression. Negative straight leg raise.  Somewhat tender over lumbar spine Neurologic: Alert and oriented x 4.  GU: Neg CVAT.  PELVIC EXAM: Cervix long and closed    FHT:  160s   Labs: No results found for this or any previous visit (from the past 24 hour(s)). B/Positive/-- (10/16 1702)  Imaging:  No results found.  MAU Course/MDM: I have ordered labs and reviewed results.  DIscussed that this hip pain may be related to relaxin hormone and softening of joints Could be referred pain from lumbar spine May be spasm of lumbar muscles Will try Flexeril If this does not relieve pain, may need ortho or PT referral.    Assessment: SIUP at 824w0d Hip pain  Low back pain Pelvic pressure with no change in cervix  Plan: Discharge home Will try Rx Flexeril for possible spasm. If does not work will refer to ortho. Warned against driving Preterm Labor precautions and fetal kick counts Follow up in Office for prenatal visits and recheck of status  Encouraged to return here or to other Urgent Care/ED if she develops worsening of symptoms, increase in pain, fever, or other concerning symptoms.    Pt stable at time of discharge.  Wynelle BourgeoisMarie Williams CNM, MSN Certified Nurse-Midwife 08/22/2016 7:49 PM

## 2016-08-22 NOTE — Discharge Instructions (Signed)
Back Pain in Pregnancy Introduction Back pain during pregnancy is common. Back pain may be caused by several factors that are related to changes during your pregnancy. Follow these instructions at home: Managing pain, stiffness, and swelling  If directed, apply ice for sudden (acute) back pain.  Put ice in a plastic bag.  Place a towel between your skin and the bag.  Leave the ice on for 20 minutes, 2-3 times per day.  If directed, apply heat to the affected area before you exercise:  Place a towel between your skin and the heat pack or heating pad.  Leave the heat on for 20-30 minutes.  Remove the heat if your skin turns bright red. This is especially important if you are unable to feel pain, heat, or cold. You may have a greater risk of getting burned. Activity  Exercise as told by your health care provider. Exercising is the best way to prevent or manage back pain.  Listen to your body when lifting. If lifting hurts, ask for help or bend your knees. This uses your leg muscles instead of your back muscles.  Squat down when picking up something from the floor. Do not bend over.  Only use bed rest as told by your health care provider. Bed rest should only be used for the most severe episodes of back pain. Standing, Sitting, and Lying Down  Do not stand in one place for long periods of time.  Use good posture when sitting. Make sure your head rests over your shoulders and is not hanging forward. Use a pillow on your lower back if necessary.  Try sleeping on your side, preferably the left side, with a pillow or two between your legs. If you are sore after a night's rest, your bed may be too soft. A firm mattress may provide more support for your back during pregnancy. General instructions  Do not wear high heels.  Eat a healthy diet. Try to gain weight within your health care provider's recommendations.  Use a maternity girdle, elastic sling, or back brace as told by your  health care provider.  Take over-the-counter and prescription medicines only as told by your health care provider.  Keep all follow-up visits as told by your health care provider. This is important. This includes any visits with any specialists, such as a physical therapist. Contact a health care provider if:  Your back pain interferes with your daily activities.  You have increasing pain in other parts of your body. Get help right away if:  You develop numbness, tingling, weakness, or problems with the use of your arms or legs.  You develop severe back pain that is not controlled with medicine.  You have a sudden change in bowel or bladder control.  You develop shortness of breath, dizziness, or you faint.  You develop nausea, vomiting, or sweating.  You have back pain that is a rhythmic, cramping pain similar to labor pains. Labor pain is usually 1-2 minutes apart, lasts for about 1 minute, and involves a bearing down feeling or pressure in your pelvis.  You have back pain and your water breaks or you have vaginal bleeding.  You have back pain or numbness that travels down your leg.  Your back pain developed after you fell.  You develop pain on one side of your back.  You see blood in your urine.  You develop skin blisters in the area of your back pain. This information is not intended to replace advice given to  you by your health care provider. Make sure you discuss any questions you have with your health care provider. Document Released: 12/20/2005 Document Revised: 02/17/2016 Document Reviewed: 05/26/2015  2017 Elsevier Hip Pain Your hip is the joint between your upper legs and your lower pelvis. The bones, cartilage, tendons, and muscles of your hip joint perform a lot of work each day supporting your body weight and allowing you to move around. Hip pain can range from a minor ache to severe pain in one or both of your hips. Pain may be felt on the inside of the hip  joint near the groin, or the outside near the buttocks and upper thigh. You may have swelling or stiffness as well. Follow these instructions at home:  Take medicines only as directed by your health care provider.  Apply ice to the injured area:  Put ice in a plastic bag.  Place a towel between your skin and the bag.  Leave the ice on for 15-20 minutes at a time, 3-4 times a day.  Keep your leg raised (elevated) when possible to lessen swelling.  Avoid activities that cause pain.  Follow specific exercises as directed by your health care provider.  Sleep with a pillow between your legs on your most comfortable side.  Record how often you have hip pain, the location of the pain, and what it feels like. Contact a health care provider if:  You are unable to put weight on your leg.  Your hip is red or swollen or very tender to touch.  Your pain or swelling continues or worsens after 1 week.  You have increasing difficulty walking.  You have a fever. Get help right away if:  You have fallen.  You have a sudden increase in pain and swelling in your hip. This information is not intended to replace advice given to you by your health care provider. Make sure you discuss any questions you have with your health care provider. Document Released: 03/01/2010 Document Revised: 02/17/2016 Document Reviewed: 05/08/2013 Elsevier Interactive Patient Education  2017 [employer]

## 2016-08-22 NOTE — MAU Note (Signed)
PT  SAYS  SINCE  SAT  SHE HAS BEEN  HAVING  LOWER BACK  AND  BOTH HIPS  PAIN  AND   VAG  FEELS  HEAVY   .  DX WITH YEAST   INFECTION-    RESOLVED.   PNC-   FAMINA.   LAST SEX- 2 WEEKS  AGO.

## 2016-08-29 NOTE — Addendum Note (Signed)
Addended by: Pennie BanterSMITH, Kyzer Blowe W on: 08/29/2016 02:01 PM   Modules accepted: Orders

## 2016-09-01 ENCOUNTER — Ambulatory Visit: Payer: Medicaid Other

## 2016-09-01 DIAGNOSIS — Z3402 Encounter for supervision of normal first pregnancy, second trimester: Secondary | ICD-10-CM

## 2016-09-04 ENCOUNTER — Ambulatory Visit (INDEPENDENT_AMBULATORY_CARE_PROVIDER_SITE_OTHER): Payer: Medicaid Other | Admitting: Obstetrics and Gynecology

## 2016-09-04 VITALS — BP 109/75 | HR 84 | Wt 211.0 lb

## 2016-09-04 DIAGNOSIS — Z3402 Encounter for supervision of normal first pregnancy, second trimester: Secondary | ICD-10-CM

## 2016-09-04 NOTE — Progress Notes (Signed)
   PRENATAL VISIT NOTE  Subjective:  Erica BergeronMarkisa L Mack is a 30 y.o. [phone] at 6837w5d being seen today for ongoing prenatal care.  She is currently monitored for the following issues for this low-risk pregnancy and has Supervision of low-risk first pregnancy and History of migraine on her problem list.  Patient reports no complaints.  Contractions: Not present. Vag. Bleeding: None.  Movement: Absent. Denies leaking of fluid.   The following portions of the patient's history were reviewed and updated as appropriate: allergies, current medications, past family history, past medical history, past social history, past surgical history and problem list. Problem list updated.  Objective:   Vitals:   09/04/16 1543  BP: 109/75  Pulse: 84  Weight: 211 lb (95.7 kg)    Fetal Status: Fetal Heart Rate (bpm): 150   Movement: Absent     General:  Alert, oriented and cooperative. Patient is in no acute distress.  Skin: Skin is warm and dry. No rash noted.   Cardiovascular: Normal heart rate noted  Respiratory: Normal respiratory effort, no problems with respiration noted  Abdomen: Soft, gravid, appropriate for gestational age. Pain/Pressure: Present     Pelvic:  Cervical exam deferred        Extremities: Normal range of motion.  Edema: None  Mental Status: Normal mood and affect. Normal behavior. Normal judgment and thought content.   Assessment and Plan:  Pregnancy: [id number] at 2537w5d  1. Encounter for supervision of low-risk first pregnancy in second trimester Patient is doing well without complaints Per patient report- incomplete anatomy on 12/8. Report not available but was requested today. F/u ultrasound ordered  AFP today - Alpha fetoprotein, maternal - US OB Follow Up; Future  General obstetric precautions including but not limited to vaginal bleeding, contractions, leaking of fluid and fetal movement were reviewed in detail with the patient. Please refer to After Visit Summary for other  counseling recommendations.  Return in about 2 weeks (around 09/18/2016).   Catalina AntiguaPeggy Santiaga Butzin, MD

## 2016-09-04 NOTE — Addendum Note (Signed)
Addended by: Garret ReddishBARNES, Satonya Lux M on: 09/04/2016 04:17 PM   Modules accepted: Orders

## 2016-09-05 ENCOUNTER — Ambulatory Visit: Payer: Medicaid Other

## 2016-09-06 LAB — AFP, SERUM, OPEN SPINA BIFIDA
AFP MOM: 1.4
AFP Value: 67.4 ng/mL
GEST. AGE ON COLLECTION DATE: 19.7 wk
MATERNAL AGE AT EDD: 31.3 a
OSBR Risk 1 IN: 7201
TEST RESULTS AFP: NEGATIVE
WEIGHT: 211 [lb_av]

## 2016-09-21 ENCOUNTER — Ambulatory Visit (INDEPENDENT_AMBULATORY_CARE_PROVIDER_SITE_OTHER): Payer: Medicaid Other | Admitting: Obstetrics

## 2016-09-21 ENCOUNTER — Encounter: Payer: Self-pay | Admitting: Obstetrics

## 2016-09-21 VITALS — BP 119/75 | HR 88 | Wt 213.0 lb

## 2016-09-21 DIAGNOSIS — K219 Gastro-esophageal reflux disease without esophagitis: Secondary | ICD-10-CM | POA: Diagnosis not present

## 2016-09-21 DIAGNOSIS — Z3482 Encounter for supervision of other normal pregnancy, second trimester: Secondary | ICD-10-CM | POA: Diagnosis not present

## 2016-09-21 DIAGNOSIS — Z3480 Encounter for supervision of other normal pregnancy, unspecified trimester: Secondary | ICD-10-CM

## 2016-09-21 MED ORDER — OMEPRAZOLE 20 MG PO CPDR: 20.0000 mg | DELAYED_RELEASE_CAPSULE | Freq: Two times a day (BID) | ORAL | 5 refills | Status: DC

## 2016-09-21 NOTE — Progress Notes (Signed)
Subjective:    Alexander BergeronMarkisa L Golob is a 30 y.o. female being seen today for her obstetrical visit. She is at 7361w1d gestation. Patient reports: heartburn . Fetal movement: normal.  Problem List Items Addressed This Visit    None     Patient Active Problem List   Diagnosis Date Noted  . Supervision of low-risk first pregnancy 07/10/2016  . History of migraine 07/10/2016   Objective:    BP 119/75   Pulse 88   Wt 213 lb (96.6 kg)   LMP 04/25/2016   BMI 34.38 kg/m  FHT: 150 BPM  Uterine Size: size equals dates     Assessment:    Pregnancy @ 1161w1d     Heartburn  Plan:     Prilosec Rx  Signs and symptoms of preterm labor: discussed.  Labs, problem list reviewed and updated 2 hr GTT planned Follow up in 4 weeks.  Patient ID: Courtney HeysMarkisa L Martell, female   DOB: 10/18/1985, 30 y.o.   MRN: [id number]

## 2016-09-24 ENCOUNTER — Encounter (HOSPITAL_COMMUNITY): Payer: Self-pay | Admitting: Certified Nurse Midwife

## 2016-09-24 ENCOUNTER — Inpatient Hospital Stay (HOSPITAL_COMMUNITY)
Admission: AD | Admit: 2016-09-24 | Discharge: 2016-09-24 | Disposition: A | Discharge status: Home / Self Care | Payer: Medicaid Other | Source: Ambulatory Visit | Attending: Obstetrics and Gynecology | Admitting: Obstetrics and Gynecology

## 2016-09-24 DIAGNOSIS — O9A212 Injury, poisoning and certain other consequences of external causes complicating pregnancy, second trimester: Secondary | ICD-10-CM

## 2016-09-24 DIAGNOSIS — Y93E1 Activity, personal bathing and showering: Secondary | ICD-10-CM | POA: Diagnosis not present

## 2016-09-24 DIAGNOSIS — M549 Dorsalgia, unspecified: Secondary | ICD-10-CM | POA: Insufficient documentation

## 2016-09-24 DIAGNOSIS — Z3A22 22 weeks gestation of pregnancy: Secondary | ICD-10-CM | POA: Diagnosis not present

## 2016-09-24 DIAGNOSIS — O26892 Other specified pregnancy related conditions, second trimester: Secondary | ICD-10-CM | POA: Insufficient documentation

## 2016-09-24 DIAGNOSIS — W182XXA Fall in (into) shower or empty bathtub, initial encounter: Secondary | ICD-10-CM | POA: Diagnosis not present

## 2016-09-24 DIAGNOSIS — S299XXA Unspecified injury of thorax, initial encounter: Secondary | ICD-10-CM

## 2016-09-24 LAB — URINALYSIS, ROUTINE W REFLEX MICROSCOPIC
Bilirubin Urine: NEGATIVE
Glucose, UA: NEGATIVE mg/dL
HGB URINE DIPSTICK: NEGATIVE
Ketones, ur: 20 mg/dL — AB
LEUKOCYTES UA: NEGATIVE
NITRITE: NEGATIVE
PROTEIN: NEGATIVE mg/dL
SPECIFIC GRAVITY, URINE: 1.013 (ref 1.005–1.030)
pH: 6 (ref 5.0–8.0)

## 2016-09-24 NOTE — Discharge Instructions (Signed)
Fall Prevention in the Home Introduction Falls can cause injuries. They can happen to people of all ages. There are many things you can do to make your home safe and to help prevent falls. What can I do on the outside of my home?  Regularly fix the edges of walkways and driveways and fix any cracks.  Remove anything that might make you trip as you walk through a door, such as a raised step or threshold.  Trim any bushes or trees on the path to your home.  Use bright outdoor lighting.  Clear any walking paths of anything that might make someone trip, such as rocks or tools.  Regularly check to see if handrails are loose or broken. Make sure that both sides of any steps have handrails.  Any raised decks and porches should have guardrails on the edges.  Have any leaves, snow, or ice cleared regularly.  Use sand or salt on walking paths during winter.  Clean up any spills in your garage right away. This includes oil or grease spills. What can I do in the bathroom?  Use night lights.  Install grab bars by the toilet and in the tub and shower. Do not use towel bars as grab bars.  Use non-skid mats or decals in the tub or shower.  If you need to sit down in the shower, use a plastic, non-slip stool.  Keep the floor dry. Clean up any water that spills on the floor as soon as it happens.  Remove soap buildup in the tub or shower regularly.  Attach bath mats securely with double-sided non-slip rug tape.  Do not have throw rugs and other things on the floor that can make you trip. What can I do in the bedroom?  Use night lights.  Make sure that you have a light by your bed that is easy to reach.  Do not use any sheets or blankets that are too big for your bed. They should not hang down onto the floor.  Have a firm chair that has side arms. You can use this for support while you get dressed.  Do not have throw rugs and other things on the floor that can make you trip. What can  I do in the kitchen?  Clean up any spills right away.  Avoid walking on wet floors.  Keep items that you use a lot in easy-to-reach places.  If you need to reach something above you, use a strong step stool that has a grab bar.  Keep electrical cords out of the way.  Do not use floor polish or wax that makes floors slippery. If you must use wax, use non-skid floor wax.  Do not have throw rugs and other things on the floor that can make you trip. What can I do with my stairs?  Do not leave any items on the stairs.  Make sure that there are handrails on both sides of the stairs and use them. Fix handrails that are broken or loose. Make sure that handrails are as long as the stairways.  Check any carpeting to make sure that it is firmly attached to the stairs. Fix any carpet that is loose or worn.  Avoid having throw rugs at the top or bottom of the stairs. If you do have throw rugs, attach them to the floor with carpet tape.  Make sure that you have a light switch at the top of the stairs and the bottom of the stairs. If you   do not have them, ask someone to add them for you. What else can I do to help prevent falls?  Wear shoes that:  Do not have high heels.  Have rubber bottoms.  Are comfortable and fit you well.  Are closed at the toe. Do not wear sandals.  If you use a stepladder:  Make sure that it is fully opened. Do not climb a closed stepladder.  Make sure that both sides of the stepladder are locked into place.  Ask someone to hold it for you, if possible.  Clearly mark and make sure that you can see:  Any grab bars or handrails.  First and last steps.  Where the edge of each step is.  Use tools that help you move around (mobility aids) if they are needed. These include:  Canes.  Walkers.  Scooters.  Crutches.  Turn on the lights when you go into a dark area. Replace any light bulbs as soon as they burn out.  Set up your furniture so you have a  clear path. Avoid moving your furniture around.  If any of your floors are uneven, fix them.  If there are any pets around you, be aware of where they are.  Review your medicines with your doctor. Some medicines can make you feel dizzy. This can increase your chance of falling. Ask your doctor what other things that you can do to help prevent falls. This information is not intended to replace advice given to you by your health care provider. Make sure you discuss any questions you have with your health care provider. Document Released: 07/08/2009 Document Revised: 02/17/2016 Document Reviewed: 10/16/2014  2017 Elsevier  

## 2016-09-24 NOTE — MAU Note (Signed)
Pt reports she fell in the shower tonight and her lower back and butt are hurting. Denies bleeding .

## 2016-09-24 NOTE — MAU Provider Note (Signed)
History   [id number] @ 22.4 wks in with c/o fall in shower tonight. C/o mild back pain but did not take anything for it. Denies ROM or vag bleeding.   CSN: [id number]  Arrival date & time 09/24/16  2003   First Provider Initiated Contact with Patient 09/24/16 2035      Chief Complaint  Patient presents with  . Back Pain    HPI  Past Medical History:  Diagnosis Date  . Chlamydia contact, treated   . Gonorrhea   . Headache(784.0)   . Hypertension    no meds; dx 2008    Past Surgical History:  Procedure Laterality Date  . DILATION AND CURETTAGE OF UTERUS    . INDUCED ABORTION      Family History  Problem Relation Age of Onset  . Hypertension Mother   . Diabetes Brother   . Hypertension Maternal Aunt   . Heart disease Maternal Aunt   . Cancer Maternal Grandmother   . Hypertension Maternal Grandmother   . Anesthesia problems Neg Hx     Social History  Substance Use Topics  . Smoking status: Never Smoker  . Smokeless tobacco: Never Used  . Alcohol use 3.3 oz/week    3 Shots of liquor, 3 Standard drinks or equivalent per week     Comment: occasional alcohol    OB History    Gravida Para Term Preterm AB Living   5 2 2   2 2    SAB TAB Ectopic Multiple Live Births     2     2      Review of Systems  Constitutional: Negative.   HENT: Negative.   Eyes: Negative.   Respiratory: Negative.   Cardiovascular: Negative.   Gastrointestinal: Positive for abdominal pain.  Endocrine: Negative.   Genitourinary: Negative.   Musculoskeletal: Positive for back pain.  Allergic/Immunologic: Negative.   Neurological: Negative.   Hematological: Negative.   Psychiatric/Behavioral: Negative.     Allergies  Aspirin  Home Medications    BP 118/71 (BP Location: Right Arm)   Pulse 93   Temp 98.8 F (37.1 C) (Oral)   Resp 18   Ht 5\' 6"  (1.676 m)   Wt 217 lb (98.4 kg)   LMP 04/25/2016   SpO2 98%   BMI 35.02 kg/m   Physical Exam  Constitutional: She is oriented to  person, place, and time. She appears well-developed and well-nourished.  Neck: Normal range of motion.  Cardiovascular: Normal rate, regular rhythm, normal heart sounds and intact distal pulses.   Pulmonary/Chest: Effort normal and breath sounds normal.  Abdominal: Soft. Bowel sounds are normal.  Musculoskeletal: Normal range of motion.  Neurological: She is alert and oriented to person, place, and time. She has normal reflexes.  Skin: Skin is warm and dry.  Psychiatric: She has a normal mood and affect. Her behavior is normal. Judgment and thought content normal.    MAU Course  Procedures (including critical care time)  Labs Reviewed  URINALYSIS, ROUTINE W REFLEX MICROSCOPIC   No results found.   1. Fall in (into) shower or empty bathtub, initial encounter       MDM  abd soft and non tender. fhr st and reg per doppler, no contractions. Will d/c home to take flexiril as prescribed.

## 2016-09-25 NOTE — L&D Delivery Note (Signed)
Delivery Note After a 10 minute second stage, at 8:15 AM a viable female was delivered via Vaginal, Spontaneous Delivery (Presentation:LOA).  APGAR: 8,9 ; weight pending.   Placenta status: Spontaneous, intact.  Cord: 3 vessels    Anesthesia:  epidural Episiotomy: None Lacerations: None Suture Repair: n/a Est. Blood Loss (mL): 100  Mom to postpartum.  Baby to Couplet care / Skin to Skin.  Cleone Slim SNM 01/20/2017, 8:53 AM  Patient is a [id number] at [redacted]w[redacted]d who was admitted w/ SOL/noted to have gHTN, but otherwise essentially uncomplicated prenatal course.  She progressed with augmentation via AROM.  I was gloved and present for delivery in its entirety.  Second stage of labor progressed, baby delivered after a few contractions.  Mild decels during second stage noted.  Complications: none  Lacerations: none  EBL: 100cc  Cam Hai, CNM 9:02 AM 01/20/2017

## 2016-09-29 ENCOUNTER — Ambulatory Visit (INDEPENDENT_AMBULATORY_CARE_PROVIDER_SITE_OTHER): Payer: Medicaid Other

## 2016-09-29 DIAGNOSIS — Z3402 Encounter for supervision of normal first pregnancy, second trimester: Secondary | ICD-10-CM | POA: Diagnosis not present

## 2016-10-19 ENCOUNTER — Ambulatory Visit (INDEPENDENT_AMBULATORY_CARE_PROVIDER_SITE_OTHER): Payer: Medicaid Other | Admitting: Obstetrics

## 2016-10-19 ENCOUNTER — Encounter: Payer: Self-pay | Admitting: Obstetrics

## 2016-10-19 DIAGNOSIS — Z3482 Encounter for supervision of other normal pregnancy, second trimester: Secondary | ICD-10-CM

## 2016-10-19 DIAGNOSIS — Z3480 Encounter for supervision of other normal pregnancy, unspecified trimester: Secondary | ICD-10-CM

## 2016-10-19 NOTE — Progress Notes (Signed)
Subjective:  Courtney HeysMarkisa L Mcbroom is a 31 y.o. [phone] at 3349w1d being seen today for ongoing prenatal care.  She is currently monitored for the following issues for this low-risk pregnancy and has Supervision of low-risk first pregnancy and History of migraine on her problem list.  Patient reports backache and pressure.  Contractions: Irritability. Vag. Bleeding: None.  Movement: Present. Denies leaking of fluid.   The following portions of the patient's history were reviewed and updated as appropriate: allergies, current medications, past family history, past medical history, past social history, past surgical history and problem list. Problem list updated.  Objective:   Vitals:   10/19/16 1412  BP: 118/72  Pulse: 95  Temp: 99.1 F (37.3 C)  Weight: 215 lb (97.5 kg)    Fetal Status: Fetal Heart Rate (bpm): 150   Movement: Present     General:  Alert, oriented and cooperative. Patient is in no acute distress.  Skin: Skin is warm and dry. No rash noted.   Cardiovascular: Normal heart rate noted  Respiratory: Normal respiratory effort, no problems with respiration noted  Abdomen: Soft, gravid, appropriate for gestational age. Pain/Pressure: Present     Pelvic:  Deferred  Extremities: Normal range of motion.  Edema: Trace  Mental Status: Normal mood and affect. Normal behavior. Normal judgment and thought content.   Urinalysis:      Assessment and Plan:  Pregnancy: [id number] at 4149w1d  1. Supervision of other normal pregnancy, antepartum Backache and pressure.  Maternity Belt Rx  Preterm labor symptoms and general obstetric precautions including but not limited to vaginal bleeding, contractions, leaking of fluid and fetal movement were reviewed in detail with the patient. Please refer to After Visit Summary for other counseling recommendations.  Return in about 2 weeks (around 11/02/2016) for 2 hour GTT.   Brock Badharles A Shantrell Placzek, MD

## 2016-10-19 NOTE — Patient Instructions (Addendum)

## 2016-10-23 ENCOUNTER — Encounter: Payer: Self-pay | Admitting: *Deleted

## 2016-10-26 ENCOUNTER — Telehealth: Payer: Self-pay

## 2016-10-26 NOTE — Telephone Encounter (Signed)
TC from pt c/o Bh's. Informed pt it's normal to have BH's at 27 weeks. Make sure she is drinking at least 64oz water daily. Pt to continue to monitor and contact the office if she notices Vb, cx's every 3-5 mins or less, and/or decreased Fm. Pt agrees and has no further questions.

## 2016-11-03 ENCOUNTER — Ambulatory Visit (INDEPENDENT_AMBULATORY_CARE_PROVIDER_SITE_OTHER): Payer: Medicaid Other | Admitting: Obstetrics

## 2016-11-03 ENCOUNTER — Other Ambulatory Visit: Payer: Medicaid Other

## 2016-11-03 ENCOUNTER — Encounter: Payer: Self-pay | Admitting: Obstetrics

## 2016-11-03 VITALS — BP 121/80 | HR 94 | Wt 214.6 lb

## 2016-11-03 DIAGNOSIS — Z3483 Encounter for supervision of other normal pregnancy, third trimester: Secondary | ICD-10-CM

## 2016-11-03 DIAGNOSIS — Z3490 Encounter for supervision of normal pregnancy, unspecified, unspecified trimester: Secondary | ICD-10-CM

## 2016-11-03 DIAGNOSIS — K219 Gastro-esophageal reflux disease without esophagitis: Secondary | ICD-10-CM

## 2016-11-03 DIAGNOSIS — Z3689 Encounter for other specified antenatal screening: Secondary | ICD-10-CM

## 2016-11-03 DIAGNOSIS — Z3400 Encounter for supervision of normal first pregnancy, unspecified trimester: Secondary | ICD-10-CM

## 2016-11-03 DIAGNOSIS — Z3480 Encounter for supervision of other normal pregnancy, unspecified trimester: Secondary | ICD-10-CM

## 2016-11-03 MED ORDER — OMEPRAZOLE 20 MG PO CPDR: 20.0000 mg | DELAYED_RELEASE_CAPSULE | Freq: Two times a day (BID) | ORAL | 5 refills | Status: DC

## 2016-11-03 NOTE — Progress Notes (Signed)
Subjective:  Erica Mack is a 31 y.o. [phone] at 5314w2d being seen today for ongoing prenatal care.  She is currently monitored for the following issues for this low-risk pregnancy and has Supervision of low-risk first pregnancy and History of migraine on her problem list.  Patient reports backache.  Contractions: Irritability. Vag. Bleeding: None.  Movement: Present. Denies leaking of fluid.   The following portions of the patient's history were reviewed and updated as appropriate: allergies, current medications, past family history, past medical history, past social history, past surgical history and problem list. Problem list updated.  Objective:   Vitals:   11/03/16 0849  BP: 121/80  Pulse: 94  Weight: 214 lb 9.6 oz (97.3 kg)    Fetal Status: Fetal Heart Rate (bpm): 150   Movement: Present     General:  Alert, oriented and cooperative. Patient is in no acute distress.  Skin: Skin is warm and dry. No rash noted.   Cardiovascular: Normal heart rate noted  Respiratory: Normal respiratory effort, no problems with respiration noted  Abdomen: Soft, gravid, appropriate for gestational age. Pain/Pressure: Present     Pelvic:  Cervical exam deferred        Extremities: Normal range of motion.  Edema: Trace  Mental Status: Normal mood and affect. Normal behavior. Normal judgment and thought content.   Urinalysis:      Assessment and Plan:  Pregnancy: [id number] at 3414w2d  1. Encounter for supervision of normal pregnancy, antepartum, unspecified gravidity Rx: - Glucose Tolerance, 2 Hours w/1 Hour - CBC - HIV antibody (with reflex) - RPR  2. GERD without esophagitis Rx: - omeprazole (PRILOSEC) 20 MG capsule; Take 1 capsule (20 mg total) by mouth 2 (two) times daily before a meal.  Dispense: 60 capsule; Refill: 5  4. Ultrasound scan to check interval growth of fetus  - US MFM OB FOLLOW UP; Future  Preterm labor symptoms and general obstetric precautions including but not limited  to vaginal bleeding, contractions, leaking of fluid and fetal movement were reviewed in detail with the patient. Please refer to After Visit Summary for other counseling recommendations.  F/U in 2 weeks   Brock Badharles A Christen Wardrop, MDPatient ID: Erica Mack, female   DOB: 10/26/1985, 31 y.o.   MRN: [id number]

## 2016-11-04 LAB — CBC
HEMATOCRIT: 34.6 % (ref 34.0–46.6)
HEMOGLOBIN: 11.4 g/dL (ref 11.1–15.9)
MCH: 28.8 pg (ref 26.6–33.0)
MCHC: 32.9 g/dL (ref 31.5–35.7)
MCV: 87 fL (ref 79–97)
Platelets: 282 10*3/uL (ref 150–379)
RBC: 3.96 x10E6/uL (ref 3.77–5.28)
RDW: 15 % (ref 12.3–15.4)
WBC: 12.1 10*3/uL — AB (ref 3.4–10.8)

## 2016-11-04 LAB — RPR: RPR Ser Ql: NONREACTIVE

## 2016-11-04 LAB — GLUCOSE TOLERANCE, 2 HOURS W/ 1HR
GLUCOSE, 2 HOUR: 107 mg/dL (ref 65–152)
Glucose, 1 hour: 144 mg/dL (ref 65–179)
Glucose, Fasting: 89 mg/dL (ref 65–91)

## 2016-11-04 LAB — HIV ANTIBODY (ROUTINE TESTING W REFLEX): HIV Screen 4th Generation wRfx: NONREACTIVE

## 2016-11-06 ENCOUNTER — Other Ambulatory Visit: Payer: Self-pay

## 2016-11-06 ENCOUNTER — Telehealth: Payer: Self-pay

## 2016-11-06 ENCOUNTER — Telehealth: Payer: Self-pay | Admitting: *Deleted

## 2016-11-06 DIAGNOSIS — Z3202 Encounter for pregnancy test, result negative: Secondary | ICD-10-CM

## 2016-11-06 DIAGNOSIS — K529 Noninfective gastroenteritis and colitis, unspecified: Secondary | ICD-10-CM

## 2016-11-06 MED ORDER — ALBUTEROL SULFATE HFA 108 (90 BASE) MCG/ACT IN AERS: 1.0000 | INHALATION_SPRAY | Freq: Four times a day (QID) | RESPIRATORY_TRACT | 0 refills | Status: DC | PRN

## 2016-11-06 NOTE — Telephone Encounter (Signed)
TC from pt reqs Albuterol inhaler. Inhaler approved. Any further rf's will need to be approved by PCP. Pt agrees and has no further questions.

## 2016-11-06 NOTE — Telephone Encounter (Signed)
PT called requesting a refill for her inhaler states she ran out, please advise.Marland Kitchen.Marland Kitchen..Marland Kitchen

## 2016-11-07 ENCOUNTER — Encounter (HOSPITAL_COMMUNITY): Payer: Self-pay | Admitting: *Deleted

## 2016-11-07 ENCOUNTER — Inpatient Hospital Stay (HOSPITAL_COMMUNITY): Payer: Medicaid Other

## 2016-11-07 ENCOUNTER — Inpatient Hospital Stay (HOSPITAL_COMMUNITY)
Admission: AD | Admit: 2016-11-07 | Discharge: 2016-11-07 | Disposition: A | Discharge status: Home / Self Care | Payer: Medicaid Other | Source: Ambulatory Visit | Attending: Obstetrics and Gynecology | Admitting: Obstetrics and Gynecology

## 2016-11-07 DIAGNOSIS — O9989 Other specified diseases and conditions complicating pregnancy, childbirth and the puerperium: Secondary | ICD-10-CM | POA: Diagnosis not present

## 2016-11-07 DIAGNOSIS — J069 Acute upper respiratory infection, unspecified: Secondary | ICD-10-CM | POA: Diagnosis not present

## 2016-11-07 DIAGNOSIS — R05 Cough: Secondary | ICD-10-CM

## 2016-11-07 DIAGNOSIS — Z3A28 28 weeks gestation of pregnancy: Secondary | ICD-10-CM | POA: Diagnosis not present

## 2016-11-07 DIAGNOSIS — B9789 Other viral agents as the cause of diseases classified elsewhere: Secondary | ICD-10-CM | POA: Diagnosis not present

## 2016-11-07 DIAGNOSIS — O99513 Diseases of the respiratory system complicating pregnancy, third trimester: Secondary | ICD-10-CM | POA: Diagnosis not present

## 2016-11-07 DIAGNOSIS — R059 Cough, unspecified: Secondary | ICD-10-CM

## 2016-11-07 HISTORY — DX: Unspecified asthma, uncomplicated: J45.909

## 2016-11-07 LAB — URINALYSIS, ROUTINE W REFLEX MICROSCOPIC
Bilirubin Urine: NEGATIVE
Glucose, UA: 50 mg/dL — AB
Hgb urine dipstick: NEGATIVE
Ketones, ur: NEGATIVE mg/dL
Leukocytes, UA: NEGATIVE
Nitrite: NEGATIVE
Protein, ur: NEGATIVE mg/dL
Specific Gravity, Urine: 1.011 (ref 1.005–1.030)
pH: 6 (ref 5.0–8.0)

## 2016-11-07 LAB — INFLUENZA PANEL BY PCR (TYPE A & B)
Influenza A By PCR: NEGATIVE
Influenza B By PCR: NEGATIVE

## 2016-11-07 NOTE — MAU Provider Note (Signed)
History     CSN: [id number]  Arrival date and time: 11/07/16 1114   First Provider Initiated Contact with Patient 11/07/16 1153      Chief Complaint  Patient presents with  . Cough   Patient is a 31 year old G5 P2 at 28 weeks and 6 days who presents today with coughing. She reports the coughing spelled present for a few days. She's bringing up mucus it was blood-tinged once. She denies fevers or chills. She reports no ongoing shortness of breath. 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 She has no other concerns or worries she's been eating and drinking fine.   Cough  This is a new problem. The current episode started in the past 7 days. The problem has been unchanged. The problem occurs constantly. The cough is productive of blood-tinged sputum and productive of sputum. Pertinent negatives include no chest pain, chills, ear congestion, fever, headaches, heartburn, myalgias, postnasal drip, rash, rhinorrhea, sore throat, shortness of breath or sweats. Nothing aggravates the symptoms. She has tried rest and OTC cough suppressant for the symptoms.    OB History    Gravida Para Term Preterm AB Living   5 2 2   2 2    SAB TAB Ectopic Multiple Live Births     2     2      Past Medical History:  Diagnosis Date  . Asthma   . Chlamydia contact, treated   . Gonorrhea   . Headache(784.0)   . Hypertension    no meds; dx 2008    Past Surgical History:  Procedure Laterality Date  . DILATION AND CURETTAGE OF UTERUS    . INDUCED ABORTION      Family History  Problem Relation Age of Onset  . Hypertension Mother   . Diabetes Brother   . Hypertension Maternal Aunt   . Heart disease Maternal Aunt   . Cancer Maternal Grandmother   . Hypertension Maternal Grandmother   . Anesthesia problems Neg Hx     Social History  Substance Use Topics  . Smoking status: Never Smoker  . Smokeless tobacco: Never Used  . Alcohol use 3.3  oz/week    3 Shots of liquor, 3 Standard drinks or equivalent per week     Comment: occasional alcohol    Allergies:  Allergies  Allergen Reactions  . Aspirin Itching    Prescriptions Prior to Admission  Medication Sig Dispense Refill Last Dose  . albuterol (PROVENTIL HFA;VENTOLIN HFA) 108 (90 Base) MCG/ACT inhaler Inhale 1-2 puffs into the lungs every 6 (six) hours as needed for wheezing or shortness of breath. 1 Inhaler 0 11/07/2016 at Unknown time  . diphenhydramine-acetaminophen (TYLENOL PM) 25-500 MG TABS tablet Take 2 tablets by mouth at bedtime as needed (for sleep).    Past Week at Unknown time  . Prenatal Vit-Fe Fumarate-FA (PRENATAL MULTIVITAMIN) TABS tablet Take 1 tablet by mouth daily.   11/07/2016 at Unknown time  . cyclobenzaprine (FLEXERIL) 5 MG tablet Take 1 tablet (5 mg total) by mouth 3 (three) times daily as needed for muscle spasms. (Patient not taking: Reported on 11/03/2016) 20 tablet 0 Not Taking at Unknown time  . omeprazole (PRILOSEC) 20 MG capsule Take 1 capsule (20 mg total) by mouth 2 (two) times daily before a meal. 60 capsule 5     Review of Systems  Constitutional: Negative for chills and fever.  HENT: Negative for postnasal drip, rhinorrhea and  sore throat.   Respiratory: Positive for cough. Negative for shortness of breath.   Cardiovascular: Negative for chest pain.  Gastrointestinal: Negative for abdominal distention, abdominal pain, diarrhea, heartburn, nausea and vomiting.  Musculoskeletal: Negative for myalgias.  Skin: Negative for rash.  Neurological: Negative for dizziness, weakness and headaches.   Physical Exam   Blood pressure 127/73, pulse 99, temperature 98.1 F (36.7 C), temperature source Oral, resp. rate 18, last menstrual period 04/25/2016.  Physical Exam  Constitutional: She is oriented to person, place, and time. She appears well-developed and well-nourished.  HENT:  Head: Normocephalic and atraumatic.  Cardiovascular: Normal rate  and intact distal pulses.   Respiratory: Effort normal and breath sounds normal. No respiratory distress. She has no wheezes. She has no rales.  GI: Soft. Bowel sounds are normal. She exhibits no distension. There is no tenderness. There is no rebound and no guarding.  Musculoskeletal: Normal range of motion. She exhibits no edema.  Neurological: She is alert and oriented to person, place, and time. No cranial nerve deficit.  Skin: Skin is warm and dry.  Psychiatric: She has a normal mood and affect. Her behavior is normal.    MAU Course  Procedures  MDM In MA U patient underwent evaluation with the following -Pulse oximetry which was totally normal -Physical exam which revealed no wheezing rhonchi or rales no increased respiratory effort -Influenza PCR which was negative -Chest x-ray which was negative -NST reviewed fetal heart rate 145 moderate variability reactive no decelerations Assessment and Plan  #1: Viral URI patient with cough productive of sputum but with no fever or pneumonia on x-ray will avoid antibiotics. Patient does has a history of asthma and has been using albuterol inhaler with some improvement. No wheezing at this time. Continue albuterol but no need for steroids. DC home and return with fever or worsening shortness of breath.  Ernestina Pennaicholas Eartha Vonbehren 11/07/2016, 1:17 PM

## 2016-11-07 NOTE — Discharge Instructions (Signed)
Safe Medications in Pregnancy   Acne:  Benzoyl Peroxide  Salicylic Acid   Backache/Headache:  Tylenol: 2 regular strength every 4 hours OR        2 Extra strength every 6 hours   Colds/Coughs/Allergies:  Benadryl (alcohol free) 25 mg every 6 hours as needed  Breath right strips  Claritin  Cepacol throat lozenges  Chloraseptic throat spray  Cold-Eeze- up to three times per day  Cough drops, alcohol free  Flonase (by prescription only)  Guaifenesin  Mucinex  Robitussin DM (plain only, alcohol free)  Saline nasal spray/drops  Sudafed (pseudoephedrine) & Actifed * use only after [redacted] weeks gestation and if you do not have high blood pressure  Tylenol  Vicks Vaporub  Zinc lozenges  Zyrtec   Constipation:  Colace  Ducolax suppositories  Fleet enema  Glycerin suppositories  Metamucil  Milk of magnesia  Miralax  Senokot  Smooth move tea   Diarrhea:  Kaopectate  Imodium A-D   *NO pepto Bismol   Hemorrhoids:  Anusol  Anusol HC  Preparation H  Tucks   Indigestion:  Tums  Maalox  Mylanta  Zantac  Pepcid   Insomnia:  Benadryl (alcohol free) 25mg  every 6 hours as needed  Tylenol PM  Unisom, no Gelcaps   Leg Cramps:  Tums  MagGel   Nausea/Vomiting:  Bonine  Dramamine  Emetrol  Ginger extract  Sea bands  Meclizine  Nausea medication to take during pregnancy:  Unisom (doxylamine succinate 25 mg tablets) Take one tablet daily at bedtime. If symptoms are not adequately controlled, the dose can be increased to a maximum recommended dose of two tablets daily (1/2 tablet in the morning, 1/2 tablet mid-afternoon and one at bedtime).  Vitamin B6 100mg  tablets. Take one tablet twice a day (up to 200 mg per day).   Skin Rashes:  Aveeno products  Benadryl cream or 25mg  every 6 hours as needed  Calamine Lotion  1% cortisone cream   Yeast infection:  Gyne-lotrimin 7  Monistat 7    **If taking multiple medications, please check labels to avoid  duplicating the same active ingredients  **take medication as directed on the label  ** Do not exceed 4000 mg of tylenol in 24 hours  **Do not take medications that contain aspirin or ibuprofen         Cough, Adult Coughing is a reflex that clears your throat and your airways. Coughing helps to heal and protect your lungs. It is normal to cough occasionally, but a cough that happens with other symptoms or lasts a long time may be a sign of a condition that needs treatment. A cough may last only 2-3 weeks (acute), or it may last longer than 8 weeks (chronic). What are the causes? Coughing is commonly caused by:  Breathing in substances that irritate your lungs.  A viral or bacterial respiratory infection.  Allergies.  Asthma.  Postnasal drip.  Smoking.  Acid backing up from the stomach into the esophagus (gastroesophageal reflux).  Certain medicines.  Chronic lung problems, including COPD (or rarely, lung cancer).  Other medical conditions such as heart failure. Follow these instructions at home: Pay attention to any changes in your symptoms. Take these actions to help with your discomfort:  Take medicines only as told by your health care provider.  If you were prescribed an antibiotic medicine, take it as told by your health care provider. Do not stop taking the antibiotic even if you start to feel better.  Talk with your  health care provider before you take a cough suppressant medicine.  Drink enough fluid to keep your urine clear or pale yellow.  If the air is dry, use a cold steam vaporizer or humidifier in your bedroom or your home to help loosen secretions.  Avoid anything that causes you to cough at work or at home.  If your cough is worse at night, try sleeping in a semi-upright position.  Avoid cigarette smoke. If you smoke, quit smoking. If you need help quitting, ask your health care provider.  Avoid caffeine.  Avoid alcohol.  Rest as  needed. Contact a health care provider if:  You have new symptoms.  You cough up pus.  Your cough does not get better after 2-3 weeks, or your cough gets worse.  You cannot control your cough with suppressant medicines and you are losing sleep.  You develop pain that is getting worse or pain that is not controlled with pain medicines.  You have a fever.  You have unexplained weight loss.  You have night sweats. Get help right away if:  You cough up blood.  You have difficulty breathing.  Your heartbeat is very fast. This information is not intended to replace advice given to you by your health care provider. Make sure you discuss any questions you have with your health care provider. Document Released: 03/10/2011 Document Revised: 02/17/2016 Document Reviewed: 11/18/2014 Elsevier Interactive Patient Education  2017 [employer].

## 2016-11-07 NOTE — MAU Note (Signed)
C/o cough and bronchitis for past 4-5 days; c/o head congestion with headache also;

## 2016-11-17 ENCOUNTER — Encounter: Payer: Self-pay | Admitting: Obstetrics

## 2016-11-17 ENCOUNTER — Ambulatory Visit (INDEPENDENT_AMBULATORY_CARE_PROVIDER_SITE_OTHER): Payer: Medicaid Other | Admitting: Obstetrics

## 2016-11-17 VITALS — BP 113/73 | HR 97 | Wt 216.6 lb

## 2016-11-17 DIAGNOSIS — Z3490 Encounter for supervision of normal pregnancy, unspecified, unspecified trimester: Secondary | ICD-10-CM

## 2016-11-17 DIAGNOSIS — Z3480 Encounter for supervision of other normal pregnancy, unspecified trimester: Secondary | ICD-10-CM

## 2016-11-17 DIAGNOSIS — Z3483 Encounter for supervision of other normal pregnancy, third trimester: Secondary | ICD-10-CM

## 2016-11-17 DIAGNOSIS — Z23 Encounter for immunization: Secondary | ICD-10-CM | POA: Diagnosis not present

## 2016-11-17 NOTE — Progress Notes (Signed)
Patient feels that she is having more pressure than normal- she would like to be checked.

## 2016-11-17 NOTE — Progress Notes (Signed)
Subjective:  Erica BergeronMarkisa L Detloff is a 31 y.o. [phone] at 3369w2d being seen today for ongoing prenatal care.  She is currently monitored for the following issues for this low-risk pregnancy and has Supervision of low-risk first pregnancy and History of migraine on her problem list.  Patient reports pressure.  Contractions: Irregular. Vag. Bleeding: None.  Movement: Present. Denies leaking of fluid.   The following portions of the patient's history were reviewed and updated as appropriate: allergies, current medications, past family history, past medical history, past social history, past surgical history and problem list. Problem list updated.  Objective:   Vitals:   11/17/16 0826  BP: 113/73  Pulse: 97  Weight: 216 lb 9.6 oz (98.2 kg)    Fetal Status: Fetal Heart Rate (bpm): 150   Movement: Present     General:  Alert, oriented and cooperative. Patient is in no acute distress.  Skin: Skin is warm and dry. No rash noted.   Cardiovascular: Normal heart rate noted  Respiratory: Normal respiratory effort, no problems with respiration noted  Abdomen: Soft, gravid, appropriate for gestational age. Pain/Pressure: Present     Pelvic:  Cervical exam performed      Long / closed / -3 / Vtx  Extremities: Normal range of motion.  Edema: None  Mental Status: Normal mood and affect. Normal behavior. Normal judgment and thought content.   Urinalysis:      Assessment and Plan:  Pregnancy: [id number] at 8969w2d  1. Pregnancy, unspecified gestational age Rx: - Tdap vaccine greater than or equal to 7yo IM  Preterm labor symptoms and general obstetric precautions including but not limited to vaginal bleeding, contractions, leaking of fluid and fetal movement were reviewed in detail with the patient. Please refer to After Visit Summary for other counseling recommendations.  No Follow-up on file.   Brock Badharles A Mirenda Baltazar, MDPatient ID: Erica BergeronMarkisa L Loftin, female   DOB: 10/24/1985, 31 y.o.   MRN: [id number]

## 2016-11-24 ENCOUNTER — Ambulatory Visit (HOSPITAL_COMMUNITY)
Admission: RE | Admit: 2016-11-24 | Discharge: 2016-11-24 | Disposition: A | Discharge status: Home / Self Care | Payer: Medicaid Other | Source: Ambulatory Visit | Attending: Obstetrics | Admitting: Obstetrics

## 2016-11-24 ENCOUNTER — Other Ambulatory Visit: Payer: Self-pay | Admitting: Obstetrics

## 2016-11-24 DIAGNOSIS — Z3A31 31 weeks gestation of pregnancy: Secondary | ICD-10-CM | POA: Insufficient documentation

## 2016-11-24 DIAGNOSIS — O99213 Obesity complicating pregnancy, third trimester: Secondary | ICD-10-CM | POA: Diagnosis present

## 2016-11-24 DIAGNOSIS — Z3689 Encounter for other specified antenatal screening: Secondary | ICD-10-CM

## 2016-12-01 ENCOUNTER — Ambulatory Visit (INDEPENDENT_AMBULATORY_CARE_PROVIDER_SITE_OTHER): Payer: Medicaid Other | Admitting: Obstetrics

## 2016-12-01 ENCOUNTER — Encounter: Payer: Self-pay | Admitting: Obstetrics

## 2016-12-01 VITALS — BP 116/70 | HR 99 | Wt 218.0 lb

## 2016-12-01 DIAGNOSIS — Z3483 Encounter for supervision of other normal pregnancy, third trimester: Secondary | ICD-10-CM

## 2016-12-01 DIAGNOSIS — Z3490 Encounter for supervision of normal pregnancy, unspecified, unspecified trimester: Secondary | ICD-10-CM

## 2016-12-01 NOTE — Progress Notes (Signed)
Pt presents ROB c/o nausea and vomiting and low back pain.

## 2016-12-03 NOTE — Progress Notes (Signed)
Subjective:  Erica Mack is a 31 y.o. [phone] at 4472w4d being seen today for ongoing prenatal care.  She is currently monitored for the following issues for this low-risk pregnancy and has Supervision of normal pregnancy, antepartum and History of migraine on her problem list.  Patient reports backache, nausea and vomiting.  Contractions: Irregular. Vag. Bleeding: None.  Movement: Present. Denies leaking of fluid.   The following portions of the patient's history were reviewed and updated as appropriate: allergies, current medications, past family history, past medical history, past social history, past surgical history and problem list. Problem list updated.  Objective:   Vitals:   12/01/16 0927  BP: 116/70  Pulse: 99  Weight: 218 lb (98.9 kg)    Fetal Status: Fetal Heart Rate (bpm): 150   Movement: Present     General:  Alert, oriented and cooperative. Patient is in no acute distress.  Skin: Skin is warm and dry. No rash noted.   Cardiovascular: Normal heart rate noted  Respiratory: Normal respiratory effort, no problems with respiration noted  Abdomen: Soft, gravid, appropriate for gestational age. Pain/Pressure: Present     Pelvic:  Cervical exam performed      Cvx:  Long / Closed  Extremities: Normal range of motion.  Edema: None  Mental Status: Normal mood and affect. Normal behavior. Normal judgment and thought content.   Urinalysis:      Assessment and Plan:  Pregnancy: [id number] at 7672w4d  1. Encounter for supervision of normal pregnancy, antepartum, unspecified gravidity   Preterm labor symptoms and general obstetric precautions including but not limited to vaginal bleeding, contractions, leaking of fluid and fetal movement were reviewed in detail with the patient. Please refer to After Visit Summary for other counseling recommendations.  F/U in 2 weeks   Erica Mack, MDPatient ID: Erica Mack, female   DOB: 06/20/1986, 31 y.o.   MRN: [id number]

## 2016-12-04 ENCOUNTER — Other Ambulatory Visit: Payer: Self-pay | Admitting: Obstetrics

## 2016-12-04 DIAGNOSIS — B3731 Acute candidiasis of vulva and vagina: Secondary | ICD-10-CM

## 2016-12-04 DIAGNOSIS — J321 Chronic frontal sinusitis: Secondary | ICD-10-CM

## 2016-12-04 DIAGNOSIS — B373 Candidiasis of vulva and vagina: Secondary | ICD-10-CM

## 2016-12-04 MED ORDER — AZITHROMYCIN 250 MG PO TABS: ORAL_TABLET | ORAL | 2 refills | Status: DC

## 2016-12-04 MED ORDER — FLUCONAZOLE 150 MG PO TABS: 150.0000 mg | ORAL_TABLET | Freq: Once | ORAL | 0 refills | Status: AC

## 2016-12-15 ENCOUNTER — Ambulatory Visit (INDEPENDENT_AMBULATORY_CARE_PROVIDER_SITE_OTHER): Payer: Medicaid Other | Admitting: Obstetrics

## 2016-12-15 ENCOUNTER — Encounter: Payer: Self-pay | Admitting: Obstetrics

## 2016-12-15 VITALS — BP 132/83 | HR 94 | Wt 219.0 lb

## 2016-12-15 DIAGNOSIS — Z3483 Encounter for supervision of other normal pregnancy, third trimester: Secondary | ICD-10-CM

## 2016-12-15 DIAGNOSIS — Z3490 Encounter for supervision of normal pregnancy, unspecified, unspecified trimester: Secondary | ICD-10-CM

## 2016-12-15 DIAGNOSIS — Z3480 Encounter for supervision of other normal pregnancy, unspecified trimester: Secondary | ICD-10-CM

## 2016-12-15 NOTE — Progress Notes (Signed)
ROB presents with no complaints today.

## 2016-12-15 NOTE — Progress Notes (Signed)
Subjective:  Erica BergeronMarkisa L Koop is a 31 y.o. [phone] at 5078w2d being seen today for ongoing prenatal care.  She is currently monitored for the following issues for this low-risk pregnancy and has Supervision of normal pregnancy, antepartum and History of migraine on her problem list.  Patient reports no complaints.  Contractions: Irregular. Vag. Bleeding: None.  Movement: Present. Denies leaking of fluid.   The following portions of the patient's history were reviewed and updated as appropriate: allergies, current medications, past family history, past medical history, past social history, past surgical history and problem list. Problem list updated.  Objective:   Vitals:   12/15/16 0933  BP: 132/83  Pulse: 94  Weight: 219 lb (99.3 kg)    Fetal Status: Fetal Heart Rate (bpm): 150   Movement: Present     General:  Alert, oriented and cooperative. Patient is in no acute distress.  Skin: Skin is warm and dry. No rash noted.   Cardiovascular: Normal heart rate noted  Respiratory: Normal respiratory effort, no problems with respiration noted  Abdomen: Soft, gravid, appropriate for gestational age. Pain/Pressure: Present     Pelvic:  Cervical exam deferred        Extremities: Normal range of motion.  Edema: Trace  Mental Status: Normal mood and affect. Normal behavior. Normal judgment and thought content.   Urinalysis:      Assessment and Plan:  Pregnancy: [id number] at 978w2d  1. Encounter for supervision of normal pregnancy, antepartum, unspecified gravidity   Preterm labor symptoms and general obstetric precautions including but not limited to vaginal bleeding, contractions, leaking of fluid and fetal movement were reviewed in detail with the patient. Please refer to After Visit Summary for other counseling recommendations.  Return in 1 week (on 12/22/2016).   Brock Badharles A Chrysta Fulcher, MDPatient ID: Erica BergeronMarkisa L Bostick, female   DOB: 06/14/1986, 31 y.o.   MRN: [id number]

## 2016-12-21 ENCOUNTER — Ambulatory Visit (INDEPENDENT_AMBULATORY_CARE_PROVIDER_SITE_OTHER): Payer: Medicaid Other | Admitting: Obstetrics

## 2016-12-21 ENCOUNTER — Encounter: Payer: Self-pay | Admitting: Obstetrics

## 2016-12-21 VITALS — BP 146/88 | HR 109 | Wt 219.0 lb

## 2016-12-21 DIAGNOSIS — Z3483 Encounter for supervision of other normal pregnancy, third trimester: Secondary | ICD-10-CM

## 2016-12-21 DIAGNOSIS — Z3490 Encounter for supervision of normal pregnancy, unspecified, unspecified trimester: Secondary | ICD-10-CM

## 2016-12-21 DIAGNOSIS — Z3480 Encounter for supervision of other normal pregnancy, unspecified trimester: Secondary | ICD-10-CM

## 2016-12-21 NOTE — Progress Notes (Signed)
Subjective:  Erica BergeronMarkisa L Mack is a 31 y.o. [phone] at 380w1d being seen today for ongoing prenatal care.  She is currently monitored for the following issues for this low-risk pregnancy and has Supervision of normal pregnancy, antepartum and History of migraine on her problem list.  Patient reports no complaints.  Contractions: Irregular. Vag. Bleeding: None.  Movement: Present. Denies leaking of fluid.   The following portions of the patient's history were reviewed and updated as appropriate: allergies, current medications, past family history, past medical history, past social history, past surgical history and problem list. Problem list updated.  Objective:   Vitals:   12/21/16 0841  BP: (!) 146/88  Pulse: (!) 109  Weight: 219 lb (99.3 kg)    Fetal Status: Fetal Heart Rate (bpm): 150   Movement: Present     General:  Alert, oriented and cooperative. Patient is in no acute distress.  Skin: Skin is warm and dry. No rash noted.   Cardiovascular: Normal heart rate noted  Respiratory: Normal respiratory effort, no problems with respiration noted  Abdomen: Soft, gravid, appropriate for gestational age. Pain/Pressure: Present     Pelvic:  Cervical exam deferred        Extremities: Normal range of motion.  Edema: Trace  Mental Status: Normal mood and affect. Normal behavior. Normal judgment and thought content.   Urinalysis:      Assessment and Plan:  Pregnancy: [id number] at 9580w1d  1. Encounter for supervision of normal pregnancy, antepartum, unspecified gravidity Rx: - Strep Gp B NAA  Preterm labor symptoms and general obstetric precautions including but not limited to vaginal bleeding, contractions, leaking of fluid and fetal movement were reviewed in detail with the patient. Please refer to After Visit Summary for other counseling recommendations.  Return in 1 week (on 12/28/2016).   Brock Badharles A Harper, MDPatient ID: Erica BergeronMarkisa L Mack, female   DOB: 08/17/1986, 31 y.o.   MRN:  [id number]

## 2016-12-21 NOTE — Progress Notes (Signed)
Pt c/o increased vaginal pressure. GBS due today.

## 2016-12-24 LAB — STREP GP B NAA: Strep Gp B NAA: POSITIVE — AB

## 2016-12-26 ENCOUNTER — Telehealth: Payer: Self-pay | Admitting: *Deleted

## 2016-12-26 NOTE — Telephone Encounter (Signed)
Pt called to office stating she is having sharp pains in her bottom.   Return call to pt.  Pt states she has been having ctx since last night.  Pt advised to time ctx, if 5 min apart for 1-2 hours she should be seen at [facility]. Pt advised that pain she is having is likely from baby position/moving down into pelvis, putting pressure on nerves. Pt advised to monitor ctx and be seen at [facility] if needed. Pt advised to call office if she would like to be seen sooner than Friday. Pt states understanding.

## 2016-12-29 ENCOUNTER — Ambulatory Visit (INDEPENDENT_AMBULATORY_CARE_PROVIDER_SITE_OTHER): Payer: Medicaid Other | Admitting: Obstetrics

## 2016-12-29 ENCOUNTER — Encounter: Payer: Self-pay | Admitting: Obstetrics

## 2016-12-29 VITALS — BP 136/83 | HR 88 | Wt 219.0 lb

## 2016-12-29 DIAGNOSIS — O4700 False labor before 37 completed weeks of gestation, unspecified trimester: Secondary | ICD-10-CM

## 2016-12-29 NOTE — Progress Notes (Signed)
Subjective:  Erica Mack is a 31 y.o. [phone] at [redacted]w[redacted]d being seen today for ongoing prenatal care.  She is currently monitored for the following issues for this low-risk pregnancy and has Supervision of normal pregnancy, antepartum and History of migraine on her problem list.  Patient reports occasional contractions and pressure.  Contractions: Irregular. Vag. Bleeding: None.  Movement: Absent. Denies leaking of fluid.   The following portions of the patient's history were reviewed and updated as appropriate: allergies, current medications, past family history, past medical history, past social history, past surgical history and problem list. Problem list updated.  Objective:   Vitals:   12/29/16 0942  BP: 136/83  Pulse: 88  Weight: 219 lb (99.3 kg)    Fetal Status: Fetal Heart Rate (bpm): 150      Movement:  Active  General:  Alert, oriented and cooperative. Patient is in no acute distress.  Skin: Skin is warm and dry. No rash noted.   Cardiovascular: Normal heart rate noted  Respiratory: Normal respiratory effort, no problems with respiration noted  Abdomen: Soft, gravid, appropriate for gestational age. Pain/Pressure: Present     Pelvic:  Cervical exam performed      Cvx: closed / 50% / -1 / Soft / Vtx  Extremities: Normal range of motion.  Edema: Trace  Mental Status: Normal mood and affect. Normal behavior. Normal judgment and thought content.   NST: Reactive.  Irregular UC's  Urinalysis:      Assessment and Plan:  Pregnancy: [id number] at [redacted]w[redacted]d  There are no diagnoses linked to this encounter. Preterm labor symptoms and general obstetric precautions including but not limited to vaginal bleeding, contractions, leaking of fluid and fetal movement were reviewed in detail with the patient. Please refer to After Visit Summary for other counseling recommendations.  Return in 1 week (on 01/05/2017).   Brock Bad, MDPatient ID: Erica Mack, female   DOB: [birth year], 31  y.o.   MRN: [id number]

## 2017-01-02 ENCOUNTER — Inpatient Hospital Stay (HOSPITAL_COMMUNITY)
Admission: AD | Admit: 2017-01-02 | Discharge: 2017-01-02 | Disposition: A | Discharge status: Home / Self Care | Payer: Medicaid Other | Source: Ambulatory Visit | Attending: Family Medicine | Admitting: Family Medicine

## 2017-01-02 ENCOUNTER — Encounter (HOSPITAL_COMMUNITY): Payer: Self-pay | Admitting: *Deleted

## 2017-01-02 DIAGNOSIS — Z3480 Encounter for supervision of other normal pregnancy, unspecified trimester: Secondary | ICD-10-CM

## 2017-01-02 DIAGNOSIS — Z8669 Personal history of other diseases of the nervous system and sense organs: Secondary | ICD-10-CM

## 2017-01-02 DIAGNOSIS — O479 False labor, unspecified: Secondary | ICD-10-CM | POA: Insufficient documentation

## 2017-01-02 LAB — URINALYSIS, ROUTINE W REFLEX MICROSCOPIC
BILIRUBIN URINE: NEGATIVE
Glucose, UA: 150 mg/dL — AB
Hgb urine dipstick: NEGATIVE
Ketones, ur: 20 mg/dL — AB
LEUKOCYTES UA: NEGATIVE
NITRITE: NEGATIVE
Protein, ur: NEGATIVE mg/dL
SPECIFIC GRAVITY, URINE: 1.015 (ref 1.005–1.030)
pH: 6 (ref 5.0–8.0)

## 2017-01-02 NOTE — MAU Note (Signed)
PT  SAYS WAS IN OFFICE  LAST WEEK-  1  CM.         FEELS  UC- HURT - 730PM.    DENIES HSV AND  MRSA.    GBS- POSITIVE

## 2017-01-05 ENCOUNTER — Ambulatory Visit (INDEPENDENT_AMBULATORY_CARE_PROVIDER_SITE_OTHER): Payer: Medicaid Other | Admitting: Obstetrics

## 2017-01-05 VITALS — BP 114/70 | HR 97 | Wt 222.0 lb

## 2017-01-05 DIAGNOSIS — Z3483 Encounter for supervision of other normal pregnancy, third trimester: Secondary | ICD-10-CM

## 2017-01-05 DIAGNOSIS — Z3480 Encounter for supervision of other normal pregnancy, unspecified trimester: Secondary | ICD-10-CM

## 2017-01-05 NOTE — Progress Notes (Signed)
Patient wants to be checked 

## 2017-01-05 NOTE — Progress Notes (Signed)
Subjective:  Erica Mack is a 31 y.o. [phone] at [redacted]w[redacted]d being seen today for ongoing prenatal care.  She is currently monitored for the following issues for this low-risk pregnancy and has Supervision of normal pregnancy, antepartum and History of migraine on her problem list.  Patient reports contractions since yesterday.  Contractions: Irregular. Vag. Bleeding: None.  Movement: Present. Denies leaking of fluid.   The following portions of the patient's history were reviewed and updated as appropriate: allergies, current medications, past family history, past medical history, past social history, past surgical history and problem list. Problem list updated.  Objective:   Vitals:   01/05/17 1038  BP: 114/70  Pulse: 97  Weight: 222 lb (100.7 kg)    Fetal Status:     Movement: Present     General:  Alert, oriented and cooperative. Patient is in no acute distress.  Skin: Skin is warm and dry. No rash noted.   Cardiovascular: Normal heart rate noted  Respiratory: Normal respiratory effort, no problems with respiration noted  Abdomen: Soft, gravid, appropriate for gestational age. Pain/Pressure: Present     Pelvic:  Cervical exam performed      Cvx: 1cm / 50% / -3 / Vtx  Extremities: Normal range of motion.  Edema: Trace  Mental Status: Normal mood and affect. Normal behavior. Normal judgment and thought content.   Urinalysis:      Assessment and Plan:  Pregnancy: [id number] at [redacted]w[redacted]d  There are no diagnoses linked to this encounter. Term labor symptoms and general obstetric precautions including but not limited to vaginal bleeding, contractions, leaking of fluid and fetal movement were reviewed in detail with the patient. Please refer to After Visit Summary for other counseling recommendations.  Return in 1 week (on 01/12/2017).   Brock Bad, MDPatient ID: Erica Mack, female   DOB: July 15, 1986, 31 y.o.   MRN: [id number]

## 2017-01-12 ENCOUNTER — Ambulatory Visit (INDEPENDENT_AMBULATORY_CARE_PROVIDER_SITE_OTHER): Payer: Medicaid Other | Admitting: Obstetrics

## 2017-01-12 ENCOUNTER — Encounter: Payer: Self-pay | Admitting: Obstetrics

## 2017-01-12 VITALS — HR 92 | Wt 219.2 lb

## 2017-01-12 DIAGNOSIS — Z3483 Encounter for supervision of other normal pregnancy, third trimester: Secondary | ICD-10-CM

## 2017-01-12 DIAGNOSIS — Z3480 Encounter for supervision of other normal pregnancy, unspecified trimester: Secondary | ICD-10-CM

## 2017-01-12 NOTE — Progress Notes (Signed)
Patient has been have nausea and excesive stools since yesterday. She has had increased pressure in the vagina since last night. She has back pain and irregular contractions also.

## 2017-01-12 NOTE — Progress Notes (Signed)
Subjective:  Erica Mack is a 31 y.o. [phone] at [redacted]w[redacted]d being seen today for ongoing prenatal care.  She is currently monitored for the following issues for this low-risk pregnancy and has Supervision of normal pregnancy, antepartum and History of migraine on her problem list.  Patient reports contractions since yesterday.  Contractions: Irregular. Vag. Bleeding: None.  Movement: Present. Denies leaking of fluid.   The following portions of the patient's history were reviewed and updated as appropriate: allergies, current medications, past family history, past medical history, past social history, past surgical history and problem list. Problem list updated.  Objective:   Vitals:   01/12/17 0947  Pulse: 92  Weight: 219 lb 3.2 oz (99.4 kg)    Fetal Status: Fetal Heart Rate (bpm): 150   Movement: Present     General:  Alert, oriented and cooperative. Patient is in no acute distress.  Skin: Skin is warm and dry. No rash noted.   Cardiovascular: Normal heart rate noted  Respiratory: Normal respiratory effort, no problems with respiration noted  Abdomen: Soft, gravid, appropriate for gestational age. Pain/Pressure: Present     Pelvic:  Cervical exam performed       Cvx:  3 cm / 70% / -2 / Vtx  Extremities: Normal range of motion.  Edema: None  Mental Status: Normal mood and affect. Normal behavior. Normal judgment and thought content.   Urinalysis:      Assessment and Plan:  Pregnancy: [id number] at [redacted]w[redacted]d  There are no diagnoses linked to this encounter. Term labor symptoms and general obstetric precautions including but not limited to vaginal bleeding, contractions, leaking of fluid and fetal movement were reviewed in detail with the patient. Please refer to After Visit Summary for other counseling recommendations.  No Follow-up on file.   Brock Bad, MDPatient ID: Alexander Bergeron Buller, female   DOB: 1986/01/31, 31 y.o.   MRN: [id number]

## 2017-01-17 ENCOUNTER — Encounter (HOSPITAL_COMMUNITY): Payer: Self-pay | Admitting: *Deleted

## 2017-01-17 ENCOUNTER — Inpatient Hospital Stay (HOSPITAL_COMMUNITY)
Admission: AD | Admit: 2017-01-17 | Discharge: 2017-01-17 | Disposition: A | Discharge status: Home / Self Care | Payer: Medicaid Other | Source: Ambulatory Visit | Attending: Obstetrics and Gynecology | Admitting: Obstetrics and Gynecology

## 2017-01-17 DIAGNOSIS — Z3A39 39 weeks gestation of pregnancy: Secondary | ICD-10-CM | POA: Insufficient documentation

## 2017-01-17 DIAGNOSIS — O471 False labor at or after 37 completed weeks of gestation: Secondary | ICD-10-CM

## 2017-01-17 DIAGNOSIS — I10 Essential (primary) hypertension: Secondary | ICD-10-CM

## 2017-01-17 LAB — COMPREHENSIVE METABOLIC PANEL
ALBUMIN: 2.8 g/dL — AB (ref 3.5–5.0)
ALT: 15 U/L (ref 14–54)
AST: 20 U/L (ref 15–41)
Alkaline Phosphatase: 110 U/L (ref 38–126)
Anion gap: 6 (ref 5–15)
BUN: 6 mg/dL (ref 6–20)
CO2: 22 mmol/L (ref 22–32)
CREATININE: 0.62 mg/dL (ref 0.44–1.00)
Calcium: 8.8 mg/dL — ABNORMAL LOW (ref 8.9–10.3)
Chloride: 107 mmol/L (ref 101–111)
GFR calc Af Amer: >60 mL/min (ref >60)
GLUCOSE: 99 mg/dL (ref 65–99)
POTASSIUM: 3.6 mmol/L (ref 3.5–5.1)
SODIUM: 135 mmol/L (ref 135–145)
Total Bilirubin: 0.6 mg/dL (ref 0.3–1.2)
Total Protein: 6.5 g/dL (ref 6.5–8.1)

## 2017-01-17 LAB — CBC
HCT: 34.3 % — ABNORMAL LOW (ref 36.0–46.0)
Hemoglobin: 11.7 g/dL — ABNORMAL LOW (ref 12.0–15.0)
MCH: 28.3 pg (ref 26.0–34.0)
MCHC: 34.1 g/dL (ref 30.0–36.0)
MCV: 82.9 fL (ref 78.0–100.0)
Platelets: 263 10*3/uL (ref 150–400)
RBC: 4.14 MIL/uL (ref 3.87–5.11)
RDW: 15.1 % (ref 11.5–15.5)
WBC: 13.7 10*3/uL — AB (ref 4.0–10.5)

## 2017-01-17 LAB — PROTEIN / CREATININE RATIO, URINE
Creatinine, Urine: 235 mg/dL
Protein Creatinine Ratio: 0.04 mg/mg{Cre} (ref 0.00–0.15)
Total Protein, Urine: 10 mg/dL

## 2017-01-17 NOTE — MAU Note (Signed)
Pt left before receiving D/C instructions.

## 2017-01-17 NOTE — MAU Provider Note (Signed)
MAU HISTORY AND PHYSICAL  Chief Complaint:  Labor Eval   Erica Mack is a 31 y.o.  [phone] with IUP at [redacted]w[redacted]d presenting for Labor Eval . Patient states she has been having  irregular, every 7 minutes contractions, no vaginal bleeding, intact membranes, with active fetal movement.  Patient notes a lot of vaginal pressure, states contractions started last night and were regular but since then have been very irregular. When she came into MAU, her BP was noted to be elevated to 144/85. Since that time her blood pressures have been in the 120s-130s/80s. SHe has no blurry vision, RUQ pain, headache, or increased pedal/hand/face edema.  Menstrual History: OB History    Gravida Para Term Preterm AB Living   SAB TAB Ectopic Multiple Live Births     2     2      Patient's last menstrual period was 04/25/2016.      Past Medical History:  Diagnosis Date  . Asthma   . Chlamydia contact, treated   . Gonorrhea   . Headache(784.0)   . Hypertension    no meds; dx 2008    Past Surgical History:  Procedure Laterality Date  . DILATION AND CURETTAGE OF UTERUS    . INDUCED ABORTION      Family History  Problem Relation Age of Onset  . Hypertension Mother   . Diabetes Brother   . Hypertension Maternal Aunt   . Heart disease Maternal Aunt   . Cancer Maternal Grandmother   . Hypertension Maternal Grandmother   . Anesthesia problems Neg Hx     Social History  Substance Use Topics  . Smoking status: Never Smoker  . Smokeless tobacco: Never Used  . Alcohol use 3.3 oz/week    3 Shots of liquor, 3 Standard drinks or equivalent per week     Comment: occasional alcohol      Allergies  Allergen Reactions  . Aspirin Itching    Prescriptions Prior to Admission  Medication Sig Dispense Refill Last Dose  . cyclobenzaprine (FLEXERIL) 5 MG tablet Take 1 tablet (5 mg total) by mouth 3 (three) times daily as needed for muscle spasms. 20 tablet 0 Past Week at Unknown time  .  diphenhydramine-acetaminophen (TYLENOL PM) 25-500 MG TABS tablet Take 2 tablets by mouth at bedtime as needed (for sleep).    Past Month at Unknown time  . Prenatal Vit-Fe Fumarate-FA (PRENATAL MULTIVITAMIN) TABS tablet Take 1 tablet by mouth daily.   01/16/2017 at Unknown time  . albuterol (PROVENTIL HFA;VENTOLIN HFA) 108 (90 Base) MCG/ACT inhaler Inhale 1-2 puffs into the lungs every 6 (six) hours as needed for wheezing or shortness of breath. 1 Inhaler 0 rescue    Review of Systems - Negative except for what is mentioned in HPI.  Physical Exam  Blood pressure 120/76, pulse 97, temperature 98.2 F (36.8 C), temperature source Oral, resp. rate 20, weight 102.5 kg (226 lb), last menstrual period 04/25/2016, SpO2 99 %. GENERAL: Well-developed, well-nourished female in no acute distress.  LUNGS: Clear to auscultation bilaterally.  HEART: Regular rate and rhythm. ABDOMEN: Soft, nontender, nondistended, gravid.  EXTREMITIES: Nontender, no edema, 2+ distal pulses. Cervical Exam: Dilatation 3cm   Effacement 60%   Station ballotable, posterior, soft (per nursing check and recheck)   Presentation: cephalic FHT:  Baseline rate 140 bpm   Variability moderate  Accelerations present   Decelerations none Contractions: Every 10-15 mins   Labs: Results for orders  placed or performed during the hospital encounter of 01/17/17 (from the past 24 hour(s))  Protein / creatinine ratio, urine   Collection Time: 01/17/17  3:54 PM  Result Value Ref Range   Creatinine, Urine 235.00 mg/dL   Total Protein, Urine 10 mg/dL   Protein Creatinine Ratio 0.04 0.00 - 0.15 mg/mg[Cre]  CBC   Collection Time: 01/17/17  4:05 PM  Result Value Ref Range   WBC 13.7 (H) 4.0 - 10.5 K/uL   RBC 4.14 3.87 - 5.11 MIL/uL   Hemoglobin 11.7 (L) 12.0 - 15.0 g/dL   HCT 16.1 (L) 09.6 - 04.5 %   MCV 82.9 78.0 - 100.0 fL   MCH 28.3 26.0 - 34.0 pg   MCHC 34.1 30.0 - 36.0 g/dL   RDW 40.9 81.1 - 91.4 %   Platelets 263 150 - 400 K/uL   Comprehensive metabolic panel   Collection Time: 01/17/17  4:05 PM  Result Value Ref Range   Sodium 135 135 - 145 mmol/L   Potassium 3.6 3.5 - 5.1 mmol/L   Chloride 107 101 - 111 mmol/L   CO2 22 22 - 32 mmol/L   Glucose, Bld 99 65 - 99 mg/dL   BUN 6 6 - 20 mg/dL   Creatinine, Ser 7.82 0.44 - 1.00 mg/dL   Calcium 8.8 (L) 8.9 - 10.3 mg/dL   Total Protein 6.5 6.5 - 8.1 g/dL   Albumin 2.8 (L) 3.5 - 5.0 g/dL   AST 20 15 - 41 U/L   ALT 15 14 - 54 U/L   Alkaline Phosphatase 110 38 - 126 U/L   Total Bilirubin 0.6 0.3 - 1.2 mg/dL   GFR calc non Af Amer >60 >60 mL/min   GFR calc Af Amer >60 >60 mL/min   Anion gap 6 5 - 15    Imaging Studies:  No results found.  Assessment: Erica Mack is  31 y.o. [phone] at [redacted]w[redacted]d presents with Labor Eval .  Plan: -given elevated BP when pt first arrived, checked cbc, cmp, and UPC which were all within normal limits -cervical exam unchanged from last visit in clinic -discussed that this was false labor vs early latent labor -labor precautions given -f/u appt with Dr. Clearance Coots made for 01/19/17 @ 9:30AM  Lupe Carney Gaynelle Cage 4/25/20185:41 PM   I confirm that I have verified the information documented in the resident's note and that I have also personally reperformed the physical exam and all medical decision making activities.   Tawnya Crook  5:53 PM 01/17/17

## 2017-01-17 NOTE — Discharge Instructions (Signed)

## 2017-01-17 NOTE — MAU Note (Signed)
Been having pains since last night.  Pains getting closer and stronger. Lots of pressure.  Feels like labor, was 3 when last checked.

## 2017-01-19 ENCOUNTER — Ambulatory Visit (INDEPENDENT_AMBULATORY_CARE_PROVIDER_SITE_OTHER): Payer: Medicaid Other | Admitting: Obstetrics

## 2017-01-19 ENCOUNTER — Encounter (HOSPITAL_COMMUNITY): Payer: Self-pay | Admitting: *Deleted

## 2017-01-19 ENCOUNTER — Encounter: Payer: Self-pay | Admitting: Obstetrics

## 2017-01-19 ENCOUNTER — Inpatient Hospital Stay (HOSPITAL_COMMUNITY)
Admission: AD | Admit: 2017-01-19 | Discharge: 2017-01-22 | DRG: 775 | Disposition: A | Discharge status: Home / Self Care | Payer: Medicaid Other | Source: Ambulatory Visit | Attending: Obstetrics and Gynecology | Admitting: Obstetrics and Gynecology

## 2017-01-19 DIAGNOSIS — Z3480 Encounter for supervision of other normal pregnancy, unspecified trimester: Secondary | ICD-10-CM

## 2017-01-19 DIAGNOSIS — Z3A39 39 weeks gestation of pregnancy: Secondary | ICD-10-CM

## 2017-01-19 DIAGNOSIS — Z833 Family history of diabetes mellitus: Secondary | ICD-10-CM

## 2017-01-19 DIAGNOSIS — O99824 Streptococcus B carrier state complicating childbirth: Secondary | ICD-10-CM | POA: Diagnosis present

## 2017-01-19 DIAGNOSIS — O9952 Diseases of the respiratory system complicating childbirth: Secondary | ICD-10-CM | POA: Diagnosis present

## 2017-01-19 DIAGNOSIS — O99214 Obesity complicating childbirth: Secondary | ICD-10-CM | POA: Diagnosis present

## 2017-01-19 DIAGNOSIS — Z8249 Family history of ischemic heart disease and other diseases of the circulatory system: Secondary | ICD-10-CM

## 2017-01-19 DIAGNOSIS — O134 Gestational [pregnancy-induced] hypertension without significant proteinuria, complicating childbirth: Principal | ICD-10-CM | POA: Diagnosis present

## 2017-01-19 DIAGNOSIS — J45909 Unspecified asthma, uncomplicated: Secondary | ICD-10-CM | POA: Diagnosis present

## 2017-01-19 DIAGNOSIS — Z6835 Body mass index (BMI) 35.0-35.9, adult: Secondary | ICD-10-CM

## 2017-01-19 DIAGNOSIS — O139 Gestational [pregnancy-induced] hypertension without significant proteinuria, unspecified trimester: Secondary | ICD-10-CM | POA: Diagnosis present

## 2017-01-19 NOTE — Progress Notes (Signed)
Subjective:  Erica Mack is a 31 y.o. [phone] at [redacted]w[redacted]d being seen today for ongoing prenatal care.  She is currently monitored for the following issues for this low-risk pregnancy and has Supervision of normal pregnancy, antepartum and History of migraine on her problem list.  Patient reports backache, contractions since am and pelvic pressure.  Contractions: Irregular. Vag. Bleeding: None.  Movement: Present. Denies leaking of fluid.   The following portions of the patient's history were reviewed and updated as appropriate: allergies, current medications, past family history, past medical history, past social history, past surgical history and problem list. Problem list updated.  Objective:   Vitals:   01/19/17 0946  BP: 128/89  Pulse: 91  Weight: 223 lb 8 oz (101.4 kg)    Fetal Status: Fetal Heart Rate (bpm): 150   Movement: Present     General:  Alert, oriented and cooperative. Patient is in no acute distress.  Skin: Skin is warm and dry. No rash noted.   Cardiovascular: Normal heart rate noted  Respiratory: Normal respiratory effort, no problems with respiration noted  Abdomen: Soft, gravid, appropriate for gestational age. Pain/Pressure: Present     Pelvic:  Cvx: 3cm / 70% / -2 / Vtx  Extremities: Normal range of motion.  Edema: Trace  Mental Status: Normal mood and affect. Normal behavior. Normal judgment and thought content.   Urinalysis:      Assessment and Plan:  Pregnancy: [id number] at [redacted]w[redacted]d  1. Supervision of other normal pregnancy, antepartum   Term labor symptoms and general obstetric precautions including but not limited to vaginal bleeding, contractions, leaking of fluid and fetal movement were reviewed in detail with the patient. Please refer to After Visit Summary for other counseling recommendations.  Return in 1 week (on 01/26/2017).   Brock Bad, MDPatient ID: Alexander Bergeron Paschen, female   DOB: 1986/06/21, 31 y.o.   MRN: [id number]

## 2017-01-19 NOTE — MAU Note (Signed)
Pt reports contractions since earlier today. Now about 5 min apart. reports her mucus plug came out but no leaking or bleeding. Good fetal movement felt. 4/70 in office today.

## 2017-01-19 NOTE — Progress Notes (Signed)
Patient reports good fetal movement complains of irregular contractions and sharp pains.

## 2017-01-20 ENCOUNTER — Inpatient Hospital Stay (HOSPITAL_COMMUNITY): Payer: Medicaid Other | Admitting: Anesthesiology

## 2017-01-20 ENCOUNTER — Encounter (HOSPITAL_COMMUNITY): Payer: Self-pay | Admitting: *Deleted

## 2017-01-20 DIAGNOSIS — O99824 Streptococcus B carrier state complicating childbirth: Secondary | ICD-10-CM | POA: Diagnosis not present

## 2017-01-20 DIAGNOSIS — O9952 Diseases of the respiratory system complicating childbirth: Secondary | ICD-10-CM | POA: Diagnosis present

## 2017-01-20 DIAGNOSIS — Z3493 Encounter for supervision of normal pregnancy, unspecified, third trimester: Secondary | ICD-10-CM | POA: Diagnosis present

## 2017-01-20 DIAGNOSIS — O134 Gestational [pregnancy-induced] hypertension without significant proteinuria, complicating childbirth: Secondary | ICD-10-CM

## 2017-01-20 DIAGNOSIS — Z6835 Body mass index (BMI) 35.0-35.9, adult: Secondary | ICD-10-CM | POA: Diagnosis not present

## 2017-01-20 DIAGNOSIS — J45909 Unspecified asthma, uncomplicated: Secondary | ICD-10-CM | POA: Diagnosis present

## 2017-01-20 DIAGNOSIS — Z8249 Family history of ischemic heart disease and other diseases of the circulatory system: Secondary | ICD-10-CM | POA: Diagnosis not present

## 2017-01-20 DIAGNOSIS — O99214 Obesity complicating childbirth: Secondary | ICD-10-CM | POA: Diagnosis present

## 2017-01-20 DIAGNOSIS — O139 Gestational [pregnancy-induced] hypertension without significant proteinuria, unspecified trimester: Secondary | ICD-10-CM | POA: Diagnosis present

## 2017-01-20 DIAGNOSIS — Z833 Family history of diabetes mellitus: Secondary | ICD-10-CM | POA: Diagnosis not present

## 2017-01-20 DIAGNOSIS — Z3A39 39 weeks gestation of pregnancy: Secondary | ICD-10-CM

## 2017-01-20 LAB — COMPREHENSIVE METABOLIC PANEL
ALT: 16 U/L (ref 14–54)
ANION GAP: 5 (ref 5–15)
AST: 22 U/L (ref 15–41)
Albumin: 2.7 g/dL — ABNORMAL LOW (ref 3.5–5.0)
Alkaline Phosphatase: 137 U/L — ABNORMAL HIGH (ref 38–126)
BILIRUBIN TOTAL: 0.3 mg/dL (ref 0.3–1.2)
BUN: 7 mg/dL (ref 6–20)
CHLORIDE: 108 mmol/L (ref 101–111)
CO2: 22 mmol/L (ref 22–32)
Calcium: 9.1 mg/dL (ref 8.9–10.3)
Creatinine, Ser: 0.6 mg/dL (ref 0.44–1.00)
Glucose, Bld: 107 mg/dL — ABNORMAL HIGH (ref 65–99)
POTASSIUM: 4.4 mmol/L (ref 3.5–5.1)
Sodium: 135 mmol/L (ref 135–145)
Total Protein: 6.1 g/dL — ABNORMAL LOW (ref 6.5–8.1)

## 2017-01-20 LAB — CBC
HCT: 34.7 % — ABNORMAL LOW (ref 36.0–46.0)
Hemoglobin: 11.8 g/dL — ABNORMAL LOW (ref 12.0–15.0)
MCH: 28.3 pg (ref 26.0–34.0)
MCHC: 34 g/dL (ref 30.0–36.0)
MCV: 83.2 fL (ref 78.0–100.0)
PLATELETS: 259 10*3/uL (ref 150–400)
RBC: 4.17 MIL/uL (ref 3.87–5.11)
RDW: 15.2 % (ref 11.5–15.5)
WBC: 13.3 10*3/uL — AB (ref 4.0–10.5)

## 2017-01-20 LAB — PROTEIN / CREATININE RATIO, URINE
CREATININE, URINE: 118 mg/dL
PROTEIN CREATININE RATIO: 0.08 mg/mg{creat} (ref 0.00–0.15)
TOTAL PROTEIN, URINE: 10 mg/dL

## 2017-01-20 LAB — RPR: RPR Ser Ql: NONREACTIVE

## 2017-01-20 LAB — TYPE AND SCREEN
ABO/RH(D): B POS
Antibody Screen: NEGATIVE

## 2017-01-20 MED ORDER — ACETAMINOPHEN 325 MG PO TABS: 650.0000 mg | ORAL_TABLET | ORAL | Status: DC | PRN

## 2017-01-20 MED ORDER — PHENYLEPHRINE 40 MCG/ML (10ML) SYRINGE FOR IV PUSH (FOR BLOOD PRESSURE SUPPORT)
PREFILLED_SYRINGE | INTRAVENOUS | Status: AC
  Filled 2017-01-20: qty 10

## 2017-01-20 MED ORDER — PENICILLIN G POT IN DEXTROSE 60000 UNIT/ML IV SOLN
3.0000 10*6.[IU] | INTRAVENOUS | Status: DC
  Administered 2017-01-20: 3 10*6.[IU] via INTRAVENOUS
  Filled 2017-01-20 (×6): qty 50

## 2017-01-20 MED ORDER — ONDANSETRON HCL 4 MG PO TABS: 4.0000 mg | ORAL_TABLET | ORAL | Status: DC | PRN

## 2017-01-20 MED ORDER — LIDOCAINE HCL (PF) 1 % IJ SOLN
INTRAMUSCULAR | Status: DC | PRN
  Administered 2017-01-20 (×2): 5 mL via EPIDURAL

## 2017-01-20 MED ORDER — OXYCODONE-ACETAMINOPHEN 5-325 MG PO TABS: 2.0000 | ORAL_TABLET | ORAL | Status: DC | PRN

## 2017-01-20 MED ORDER — EPHEDRINE 5 MG/ML INJ
10.0000 mg | INTRAVENOUS | Status: DC | PRN
  Filled 2017-01-20: qty 2

## 2017-01-20 MED ORDER — DIBUCAINE 1 % RE OINT: 1.0000 "application " | TOPICAL_OINTMENT | RECTAL | Status: DC | PRN

## 2017-01-20 MED ORDER — BENZOCAINE-MENTHOL 20-0.5 % EX AERO: 1.0000 "application " | INHALATION_SPRAY | CUTANEOUS | Status: DC | PRN

## 2017-01-20 MED ORDER — TERBUTALINE SULFATE 1 MG/ML IJ SOLN
0.2500 mg | Freq: Once | INTRAMUSCULAR | Status: DC | PRN
  Filled 2017-01-20: qty 1

## 2017-01-20 MED ORDER — SOD CITRATE-CITRIC ACID 500-334 MG/5ML PO SOLN: 30.0000 mL | ORAL | Status: DC | PRN

## 2017-01-20 MED ORDER — LACTATED RINGERS IV SOLN
INTRAVENOUS | Status: DC
  Administered 2017-01-20: 06:00:00 via INTRAVENOUS

## 2017-01-20 MED ORDER — ONDANSETRON HCL 4 MG/2ML IJ SOLN: 4.0000 mg | INTRAMUSCULAR | Status: DC | PRN

## 2017-01-20 MED ORDER — LIDOCAINE HCL (PF) 1 % IJ SOLN
30.0000 mL | INTRAMUSCULAR | Status: DC | PRN
  Filled 2017-01-20: qty 30

## 2017-01-20 MED ORDER — WITCH HAZEL-GLYCERIN EX PADS: 1.0000 "application " | MEDICATED_PAD | CUTANEOUS | Status: DC | PRN

## 2017-01-20 MED ORDER — FENTANYL 2.5 MCG/ML BUPIVACAINE 1/10 % EPIDURAL INFUSION (WH - ANES)
14.0000 mL/h | INTRAMUSCULAR | Status: DC | PRN
  Administered 2017-01-20 (×2): 14 mL/h via EPIDURAL

## 2017-01-20 MED ORDER — DIPHENHYDRAMINE HCL 25 MG PO CAPS: 25.0000 mg | ORAL_CAPSULE | Freq: Four times a day (QID) | ORAL | Status: DC | PRN

## 2017-01-20 MED ORDER — OXYCODONE HCL 5 MG PO TABS: 5.0000 mg | ORAL_TABLET | ORAL | Status: DC | PRN

## 2017-01-20 MED ORDER — OXYTOCIN BOLUS FROM INFUSION
500.0000 mL | Freq: Once | INTRAVENOUS | Status: AC
  Administered 2017-01-20: 500 mL via INTRAVENOUS

## 2017-01-20 MED ORDER — LACTATED RINGERS IV SOLN: 500.0000 mL | INTRAVENOUS | Status: DC | PRN

## 2017-01-20 MED ORDER — ACETAMINOPHEN 325 MG PO TABS
650.0000 mg | ORAL_TABLET | ORAL | Status: DC | PRN
  Administered 2017-01-20 – 2017-01-21 (×2): 650 mg via ORAL
  Filled 2017-01-20 (×2): qty 2

## 2017-01-20 MED ORDER — SIMETHICONE 80 MG PO CHEW: 80.0000 mg | CHEWABLE_TABLET | ORAL | Status: DC | PRN

## 2017-01-20 MED ORDER — PRENATAL MULTIVITAMIN CH
1.0000 | ORAL_TABLET | Freq: Every day | ORAL | Status: DC
  Administered 2017-01-21 – 2017-01-22 (×2): 1 via ORAL
  Filled 2017-01-20: qty 1

## 2017-01-20 MED ORDER — ONDANSETRON HCL 4 MG/2ML IJ SOLN: 4.0000 mg | Freq: Four times a day (QID) | INTRAMUSCULAR | Status: DC | PRN

## 2017-01-20 MED ORDER — TETANUS-DIPHTH-ACELL PERTUSSIS 5-2.5-18.5 LF-MCG/0.5 IM SUSP: 0.5000 mL | Freq: Once | INTRAMUSCULAR | Status: DC

## 2017-01-20 MED ORDER — DIPHENHYDRAMINE HCL 50 MG/ML IJ SOLN: 12.5000 mg | INTRAMUSCULAR | Status: DC | PRN

## 2017-01-20 MED ORDER — COCONUT OIL OIL: 1.0000 "application " | TOPICAL_OIL | Status: DC | PRN

## 2017-01-20 MED ORDER — LACTATED RINGERS IV SOLN: 500.0000 mL | Freq: Once | INTRAVENOUS | Status: DC

## 2017-01-20 MED ORDER — PENICILLIN G POTASSIUM 5000000 UNITS IJ SOLR
5.0000 10*6.[IU] | Freq: Once | INTRAVENOUS | Status: AC
  Administered 2017-01-20: 5 10*6.[IU] via INTRAVENOUS
  Filled 2017-01-20: qty 5

## 2017-01-20 MED ORDER — FLEET ENEMA 7-19 GM/118ML RE ENEM: 1.0000 | ENEMA | RECTAL | Status: DC | PRN

## 2017-01-20 MED ORDER — OXYTOCIN 40 UNITS IN LACTATED RINGERS INFUSION - SIMPLE MED: 2.5000 [IU]/h | INTRAVENOUS | Status: DC

## 2017-01-20 MED ORDER — SENNOSIDES-DOCUSATE SODIUM 8.6-50 MG PO TABS
2.0000 | ORAL_TABLET | ORAL | Status: DC
  Administered 2017-01-21: 2 via ORAL
  Filled 2017-01-20 (×2): qty 2

## 2017-01-20 MED ORDER — PHENYLEPHRINE 40 MCG/ML (10ML) SYRINGE FOR IV PUSH (FOR BLOOD PRESSURE SUPPORT)
80.0000 ug | PREFILLED_SYRINGE | INTRAVENOUS | Status: DC | PRN
  Filled 2017-01-20: qty 5

## 2017-01-20 MED ORDER — VITAMIN K1 1 MG/0.5ML IJ SOLN
INTRAMUSCULAR | Status: AC
  Filled 2017-01-20: qty 0.5

## 2017-01-20 MED ORDER — OXYCODONE-ACETAMINOPHEN 5-325 MG PO TABS: 1.0000 | ORAL_TABLET | ORAL | Status: DC | PRN

## 2017-01-20 MED ORDER — IBUPROFEN 600 MG PO TABS
600.0000 mg | ORAL_TABLET | Freq: Four times a day (QID) | ORAL | Status: DC
  Administered 2017-01-20 – 2017-01-22 (×8): 600 mg via ORAL
  Filled 2017-01-20 (×9): qty 1

## 2017-01-20 MED ORDER — OXYTOCIN 40 UNITS IN LACTATED RINGERS INFUSION - SIMPLE MED
1.0000 m[IU]/min | INTRAVENOUS | Status: DC
  Administered 2017-01-20: 2 m[IU]/min via INTRAVENOUS
  Filled 2017-01-20: qty 1000

## 2017-01-20 MED ORDER — FENTANYL 2.5 MCG/ML BUPIVACAINE 1/10 % EPIDURAL INFUSION (WH - ANES)
INTRAMUSCULAR | Status: AC
  Filled 2017-01-20: qty 100

## 2017-01-20 MED ORDER — ZOLPIDEM TARTRATE 5 MG PO TABS: 5.0000 mg | ORAL_TABLET | Freq: Every evening | ORAL | Status: DC | PRN

## 2017-01-20 MED ORDER — PHENYLEPHRINE 40 MCG/ML (10ML) SYRINGE FOR IV PUSH (FOR BLOOD PRESSURE SUPPORT)
80.0000 ug | PREFILLED_SYRINGE | INTRAVENOUS | Status: DC | PRN
Start: 2017-01-20 — End: 2017-01-20
  Filled 2017-01-20: qty 5

## 2017-01-20 NOTE — Anesthesia Procedure Notes (Signed)
Epidural Patient location during procedure: OB Start time: 01/20/2017 3:04 AM End time: 01/20/2017 3:18 AM  Staffing Anesthesiologist: Heather Roberts Performed: anesthesiologist   Preanesthetic Checklist Completed: patient identified, site marked, pre-op evaluation, timeout performed, IV checked, risks and benefits discussed and monitors and equipment checked  Epidural Patient position: sitting Prep: DuraPrep Patient monitoring: heart rate, cardiac monitor, continuous pulse ox and blood pressure Approach: midline Location: L2-L3 Injection technique: LOR saline  Needle:  Needle type: Tuohy  Needle gauge: 17 G Needle length: 9 cm Needle insertion depth: 6 cm Catheter size: 20 Guage Catheter at skin depth: 11 cm Test dose: negative and Other  Assessment Events: blood not aspirated, injection not painful, no injection resistance and negative IV test  Additional Notes Informed consent obtained prior to proceeding including risk of failure, 1% risk of PDPH, risk of minor discomfort and bruising.  Discussed rare but serious complications including epidural abscess, permanent nerve injury, epidural hematoma.  Discussed alternatives to epidural analgesia and patient desires to proceed.  Timeout performed pre-procedure verifying patient name, procedure, and platelet count.  Patient tolerated procedure well.

## 2017-01-20 NOTE — Progress Notes (Signed)
Subjective: Patient reporting more pressure with contractions but still comfortable with epidural.   Objective: Vitals:   01/20/17 0725 01/20/17 0740 01/20/17 0745 01/20/17 0750  BP: (!) 117/53     Pulse: 81     Resp: Temp:      TempSrc:      SpO2:      Weight:      Height:        FHT:  FHR: 120 bpm, variability: moderate,  accelerations:  Present,  decelerations:  Absent UC:   regular, every 2-3 minutes SVE:   Dilation: Lip/rim Effacement (%): 100 Station: +1 Exam by:: Ma Hillock, SNM Pitocin @ 4 mu/min   Assessment / Plan: IUP at term. Spontaneous onset of labor. GBS pos  Plan: AROM at 0750 with large amount of clear fluid. Recheck with increased pressure. Anticipate NSVD.  Cleone Slim SNM 01/20/2017, 7:52 AM  I confirm that I have verified the information documented in the student nurse midwife's note and that I have also personally reperformed the physical exam and all medical decision making activities.  Cam Hai 01/20/2017 9:02 AM

## 2017-01-20 NOTE — Anesthesia Pain Management Evaluation Note (Signed)
  CRNA Pain Management Visit Note  Patient: Erica Mack, 31 y.o., female  "Hello I am a member of the anesthesia team at [facility]. We have an anesthesia team available at all times to provide care throughout the hospital, including epidural management and anesthesia for C-section. I don't know your plan for the delivery whether it a natural birth, water birth, IV sedation, nitrous supplementation, doula or epidural, but we want to meet your pain goals."   1.Was your pain managed to your expectations on prior hospitalizations?   Yes   2.What is your expectation for pain management during this hospitalization?     Epidural  3.How can we help you reach that goal? Epidural when ready.  Record the patient's initial score and the patient's pain goal.   Pain: 0  Pain Goal: 10 [facility] wants you to be able to say your pain was always managed very well.  Ellouise Mcwhirter L 01/20/2017

## 2017-01-20 NOTE — Anesthesia Postprocedure Evaluation (Signed)
Anesthesia Post Note  Patient: Erica Mack  Procedure(s) Performed: * No procedures listed *  Patient location during evaluation: Mother Baby Anesthesia Type: Epidural Level of consciousness: awake, awake and alert, oriented and patient cooperative Pain management: pain level controlled Vital Signs Assessment: post-procedure vital signs reviewed and stable Respiratory status: spontaneous breathing, nonlabored ventilation and respiratory function stable Cardiovascular status: stable Postop Assessment: no headache, no backache, epidural receding, patient able to bend at knees and no signs of nausea or vomiting Anesthetic complications: no        Last Vitals:  Vitals:   01/20/17 1148 01/20/17 1321  BP: (!) 146/96 120/68  Pulse: 77 90  Resp: 20 20  Temp: 36.7 C 36.7 C    Last Pain:  Vitals:   01/20/17 1330  TempSrc:   PainSc: 0-No pain   Pain Goal: Patients Stated Pain Goal: 0 (01/19/17 2324)               [name] L

## 2017-01-20 NOTE — Progress Notes (Signed)
Subjective: Patient reports relief with epidural. Resting now.   Objective: Vitals:   01/20/17 0336 01/20/17 0340 01/20/17 0341 01/20/17 0403  BP: 130/84  130/85 122/83  Pulse: (!) 103 (!) 114 93 (!) 108  Resp: 20   20  Temp:      TempSrc:      SpO2:  100%      FHT:  FHR: 120 bpm, variability: moderate,  accelerations:  Present,  decelerations:  Absent UC:   regular, every 3-4 minutes SVE:   Dilation: 6 Effacement (%): 90 Station: -2 Exam by:: Elana Alm RNC  Assessment / Plan: IUP at term. Spontaneous onset of labor.  Plan: manage expectantly. PCN for GBS prophylaxis. Cleone Slim SNM 01/20/2017, 4:44 AM  I confirm that I have verified the information documented in the student nurse midwife's note and that I have also personally reperformed the physical exam and all medical decision making activities.  Cam Hai 01/20/2017 9:01 AM

## 2017-01-20 NOTE — H&P (Signed)
Erica Mack is a 31 y.o. female [phone] at 39 weeks 3 days presenting for contractions (possible SOL) and was noted to have gestational hypertension. States her contractions started around 1900. Denies leaking of fluid or vaginal bleeding. Her preg has been followed by the [facility] office and has been essentially unremarkable other than hx migrane and asthma. Potential hx cHTN dx in 2008 but has not required meds.  OB History    Gravida Para Term Preterm AB Living   SAB TAB Ectopic Multiple Live Births     2     2     Past Medical History:  Diagnosis Date  . Asthma   . Chlamydia contact, treated   . Gonorrhea   . Headache(784.0)   . Hypertension    no meds; dx 2008   Past Surgical History:  Procedure Laterality Date  . DILATION AND CURETTAGE OF UTERUS    . INDUCED ABORTION     Family History: family history includes Cancer in her maternal grandmother; Diabetes in her brother; Heart disease in her maternal aunt; Hypertension in her maternal aunt, maternal grandmother, and mother. Social History:  reports that she has never smoked. She has never used smokeless tobacco. She reports that she drinks about 3.3 oz of alcohol per week . She reports that she uses drugs, including Marijuana.     Maternal Diabetes: No Genetic Screening: Normal Maternal Ultrasounds/Referrals: Normal Fetal Ultrasounds or other Referrals:  None Maternal Substance Abuse:  No Significant Maternal Medications:  None Significant Maternal Lab Results:  Lab values include: Group B Strep positive Other Comments:  None  Review of Systems  Constitutional: Negative.   HENT: Negative.   Eyes: Negative.   Respiratory: Negative.   Cardiovascular: Negative.   Gastrointestinal: Negative.   Genitourinary: Negative.   Musculoskeletal: Negative.   Skin: Negative.   Neurological: Negative.   Endo/Heme/Allergies: Negative.   Psychiatric/Behavioral: Negative.    Maternal Medical History:  Reason  for admission: Contractions.  IOL for gestational hypertension.  Contractions: Onset was 6-12 hours ago.   Frequency: regular.   Perceived severity is moderate.    Fetal activity: Perceived fetal activity is normal.   Last perceived fetal movement was within the past hour.    Prenatal complications: PIH.   Prenatal Complications - Diabetes: none.    Dilation: 4 Effacement (%): 100 Station: -2 Exam by:: K.Wilson,RN Blood pressure (!) 141/85, pulse 85, last menstrual period 04/25/2016. Maternal Exam:  Uterine Assessment: Contraction strength is moderate.  Contraction duration is 60 seconds. Contraction frequency is regular.   Abdomen: Patient reports no abdominal tenderness. Estimated fetal weight is 7lbs.   Fetal presentation: vertex  Introitus: Normal vulva. Normal vagina.  Ferning test: not done.  Nitrazine test: not done. Amniotic fluid character: not assessed.  Pelvis: adequate for delivery.   Cervix: Cervix evaluated by digital exam.     Fetal Exam Fetal Monitor Review: Mode: ultrasound.   Baseline rate: 120.  Variability: moderate (6-25 bpm).   Pattern: accelerations present and no decelerations.    Fetal State Assessment: Category I - tracings are normal.     Physical Exam  Constitutional: She is oriented to person, place, and time. She appears well-developed and well-nourished.  HENT:  Head: Normocephalic.  Eyes: Pupils are equal, round, and reactive to light.  Neck: Normal range of motion.  Cardiovascular: Normal rate, regular rhythm and normal heart sounds.   Respiratory: Effort normal.  GI: Soft.  Musculoskeletal:  Normal range of motion.  Neurological: She is alert and oriented to person, place, and time.  Skin: Skin is warm and dry.  Psychiatric: She has a normal mood and affect. Her behavior is normal. Judgment and thought content normal.    Pre-e labs nl  Prenatal labs: ABO, Rh: B/Positive/-- (10/16 1702) Antibody: Negative (10/16  1702) Rubella: 7.07 (10/16 1702) RPR: Non Reactive (02/09 1040)  HBsAg: Negative (10/16 1702)  HIV: Non Reactive (02/09 1040)  GBS: Positive (03/29 0954)   Assessment/Plan: IUP at term. SOL; gestational hypertension. GBS pos.  Plan: start pitocin for augmentation prn. PCN for GBS prophylaxis.   Cleone Slim 01/20/2017, 1:34 AM   CNM attestation:  Erica Mack is a 31 y.o. [phone] here for SOL, and noted to have gHTN  PE: BP 136/81   Pulse 88   Temp 97.7 F (36.5 C) (Oral)   Resp 20   Ht  (1.676 m)   Wt 101.2 kg (223 lb)   LMP 04/25/2016   SpO2 100%   BMI 35.99 kg/m  Gen: calm comfortable, NAD Resp: normal effort, no distress Abd: gravid  ROS, labs, PMH reviewed  Plan: Admit to [facility] Expectant management PCN for GBS ppx Pit for augmentation prn Anticipate SVD  Deunta Beneke CNM 01/20/2017, 8:59 AM

## 2017-01-20 NOTE — Anesthesia Preprocedure Evaluation (Signed)
Anesthesia Evaluation  Patient identified by MRN, date of birth, ID band Patient awake    Reviewed: Allergy & Precautions, NPO status , Patient's Chart, lab work & pertinent test results  Airway Mallampati: II  TM Distance: >3 FB Neck ROM: Full    Dental no notable dental hx. (+) Dental Advisory Given   Pulmonary asthma ,    Pulmonary exam normal        Cardiovascular hypertension, Pt. on medications negative cardio ROS Normal cardiovascular exam     Neuro/Psych negative neurological ROS  negative psych ROS   GI/Hepatic negative GI ROS, Neg liver ROS,   Endo/Other  Morbid obesity  Renal/GU negative Renal ROS  negative genitourinary   Musculoskeletal negative musculoskeletal ROS (+)   Abdominal   Peds negative pediatric ROS (+)  Hematology negative hematology ROS (+)   Anesthesia Other Findings   Reproductive/Obstetrics (+) Pregnancy                             Anesthesia Physical Anesthesia Plan  ASA: II  Anesthesia Plan: Epidural   Post-op Pain Management:    Induction:   Airway Management Planned: Natural Airway  Additional Equipment:   Intra-op Plan:   Post-operative Plan:   Informed Consent: I have reviewed the patients History and Physical, chart, labs and discussed the procedure including the risks, benefits and alternatives for the proposed anesthesia with the patient or authorized representative who has indicated his/her understanding and acceptance.   Dental advisory given  Plan Discussed with: Anesthesiologist  Anesthesia Plan Comments:         Anesthesia Quick Evaluation

## 2017-01-21 NOTE — Progress Notes (Signed)
Post Partum Day 1 Subjective: no complaints, up ad lib, voiding and tolerating PO  Objective: Blood pressure 117/66, pulse (!) 102, temperature 98.2 F (36.8 C), temperature source Oral, resp. rate 18, height  (1.676 m), weight 223 lb (101.2 kg), last menstrual period 04/25/2016, SpO2 98 %, unknown if currently breastfeeding.  Physical Exam:  General: alert, cooperative, appears stated age and no distress Lochia: appropriate Uterine Fundus: firm Incision: n/a DVT Evaluation: No evidence of DVT seen on physical exam.   Recent Labs  01/19/17 2356  HGB 11.8*  HCT 34.7*    Assessment/Plan: Plan for discharge tomorrow   LOS: 1 day   Erica Mack 01/21/2017, 9:46 AM

## 2017-01-21 NOTE — Progress Notes (Signed)
CSW attempted to meet with MOB at bedside to complete assessment for consult regarding hx of ETOH and THC abuse. Upon CSW arrival to the room, MOB had three small children present and an unidentified visitor on the couch. MOB requested to be seen at a later time. CSW will provide handoff to weekday CSW to follow-up Monday prior to d/c.   Estephanie Hubbs, MSW, LCSW-A Clinical Social Worker  [facility]  Office: [phone]

## 2017-01-21 NOTE — Plan of Care (Signed)
Problem: Nutritional: Goal: Mothers verbalization of comfort with breastfeeding process will improve Outcome: Completed/Met Date Met: 01/21/17 Patient has been informed about breastfeeding through [facility] and decided she wants to formula feed her baby only. Baby been skin to skin several times and roomed in with mother. Mother demonstrates appropriate formula feeding and care for her infant.

## 2017-01-22 MED ORDER — MEDROXYPROGESTERONE ACETATE 150 MG/ML IM SUSP: 150.0000 mg | INTRAMUSCULAR | 4 refills | Status: DC

## 2017-01-22 MED ORDER — IBUPROFEN 600 MG PO TABS: 600.0000 mg | ORAL_TABLET | Freq: Four times a day (QID) | ORAL | 2 refills | Status: DC

## 2017-01-22 MED ORDER — SENNOSIDES-DOCUSATE SODIUM 8.6-50 MG PO TABS: 2.0000 | ORAL_TABLET | Freq: Every day | ORAL | 2 refills | Status: DC

## 2017-01-22 MED ORDER — OXYCODONE HCL 5 MG PO TABS: 5.0000 mg | ORAL_TABLET | ORAL | 0 refills | Status: DC | PRN

## 2017-01-22 NOTE — Progress Notes (Signed)
CLINICAL SOCIAL WORK MATERNAL/CHILD NOTE  Patient Details  Name: Liviya Santini MRN: [id number] Date of Birth: 01/20/2017  Date:  01/22/2017  Clinical Social Worker Initiating Note:  Terri Piedra, [place] Date/ Time Initiated:  01/22/17/0915     Child's Name:  [facility]   Legal Guardian:  Other (Comment) (Parents: [address] and [facility])   Need for Interpreter:  None   Date of Referral:  01/20/17     Reason for Referral:  Current Substance Use/Substance Use During Pregnancy    Referral Source:  [facility]   Address:  [address] 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  FOB works at [employer]:      Financial Resources:  [employer]   Other Resources:      Cultural/Religious Considerations Which May Impact Care: None stated.  MOB's facesheet notes religion as [place].  Strengths:  Ability to meet basic needs , Pediatrician chosen , Home prepared for child  [facility] Pediatricians)   Risk Factors/Current Problems:  None   Cognitive State:  Able to Concentrate , Alert , Linear Thinking , Goal Oriented , Insightful    Mood/Affect:  Calm , Comfortable , Interested    CSW Assessment: CSW met with MOB in her first floor room to complete assessment due to hx of alcohol and marijuana use.  MOB was pleasant and welcoming of CSW's visit, however, states that she is very "sleepy," noting that she feels someone came in her room every time they fell asleep throughout the night.   MOB was holding baby on her chest and  bonding is evident.  She reports that she and baby are doing well and shared her pregnancy experience with CSW.  She states she was sick for the first four months, but reports an easy end to pregnancy.  She states she felt well emotionally throughout pregnancy and states no emotional concerns now or after her other two children were born.  CSW provided education regarding PMADs.  MOB was engaged and attentive.   MOB reports that she has "more than enough" supplies for infant at home, and is aware of SIDS precautions as reviewed by CSW.  She states she and FOB are in a relationship and that he is involved and supportive.  They live together and this is their first child together.  She states that have a huge support system.  She plans to return to work after maternity leave, but states, "I'm ready to go back.  I miss my clients."   CSW inquired about alcohol and marijuana use noted in her medical record.  MOB admits to smoking marijuana in the first two months of pregnancy and denies all alcohol use in pregnancy.  She denies any other substance use also.  CSW explained hospital drug screen policy and mandated reporting for positive screens.  MOB was understanding and not concerned.  Baby's UDS is negative.  CSW will monitor CDS results and make report accordingly.  CSW Plan/Description:  No Further Intervention Required/No Barriers to Discharge, Patient/Family Education     Terri Piedra  Benjamine Mola, [place] 01/22/2017, 10:00 AM

## 2017-01-22 NOTE — Progress Notes (Signed)
Post Partum Day #2 Subjective: no complaints, up ad lib, voiding and tolerating PO  Objective: Blood pressure 122/76, pulse 86, temperature 98.1 F (36.7 C), resp. rate 20, height  (1.676 m), weight 223 lb (101.2 kg), last menstrual period 04/25/2016, SpO2 97 %, unknown if currently breastfeeding.  Physical Exam:  General: alert, cooperative and no distress Lochia: appropriate Uterine Fundus: firm Incision: n/a DVT Evaluation: No evidence of DVT seen on physical exam. No cords or calf tenderness. No significant calf/ankle edema.   Recent Labs  01/19/17 2356  HGB 11.8*  HCT 34.7*    Assessment/Plan: Discharge home and Contraception Depo injections.   LOS: 2 days   Roe Coombs, CNM 01/22/2017, 7:21 AM

## 2017-01-22 NOTE — Discharge Summary (Signed)
OB Discharge Summary     Patient Name: Erica Mack DOB: [birth year] MRN: [id number]  Date of admission: 01/19/2017 Delivering MD: Rolm Bookbinder   Date of discharge: 01/22/2017  Admitting diagnosis: 39wks contractions  Intrauterine pregnancy: [redacted]w[redacted]d     Secondary diagnosis:  Active Problems:   Gestational HTN  Additional problems: none     Discharge diagnosis: Term Pregnancy Delivered                                                                                                Post partum procedures:none  Augmentation: AROM and Pitocin  Complications: None  Hospital course:  Onset of Labor With Vaginal Delivery     31 y.o. yo [id number] at [redacted]w[redacted]d was admitted in Latent Labor on 01/19/2017. Patient had an uncomplicated labor course as follows:  Membrane Rupture Time/Date: 7:45 AM ,01/20/2017   Intrapartum Procedures: Episiotomy: None [1]                                         Lacerations:  None [1]  Patient had a delivery of a Viable infant. 01/20/2017  Information for the patient's newborn:  Brigett, Estell [[id number]]  Delivery Method: Vaginal, Spontaneous Delivery (Filed from Delivery Summary)    Pateint had an uncomplicated postpartum course.  She is ambulating, tolerating a regular diet, passing flatus, and urinating well. Patient is discharged home in stable condition on 01/22/17.   Physical exam  Vitals:   01/20/17 1630 01/21/17 0536 01/21/17 1741 01/22/17 0527  BP: (!) 141/79 117/66 131/73 122/76  Pulse: 79 (!) 102 95 86  Resp: Temp: 98.6 F (37 C) 98.2 F (36.8 C) 98.2 F (36.8 C) 98.1 F (36.7 C)  TempSrc: Oral Oral Oral   SpO2:   97%   Weight:      Height:       General: alert, cooperative and no distress Lochia: appropriate Uterine Fundus: firm Incision: N/A DVT Evaluation: No evidence of DVT seen on physical exam. No cords or calf tenderness. No significant calf/ankle edema. Labs: Lab Results  Component Value Date   WBC  13.3 (H) 01/19/2017   HGB 11.8 (L) 01/19/2017   HCT 34.7 (L) 01/19/2017   MCV 83.2 01/19/2017   PLT 259 01/19/2017   CMP Latest Ref Rng & Units 01/19/2017  Glucose 65 - 99 mg/dL 130(Q)  BUN 6 - 20 mg/dL 7  Creatinine 6.57 - 8.46 mg/dL 9.62  Sodium 952 - 841 mmol/L 135  Potassium 3.5 - 5.1 mmol/L 4.4  Chloride 101 - 111 mmol/L 108  CO2 22 - 32 mmol/L 22  Calcium 8.9 - 10.3 mg/dL 9.1  Total Protein 6.5 - 8.1 g/dL 6.1(L)  Total Bilirubin 0.3 - 1.2 mg/dL 0.3  Alkaline Phos 38 - 126 U/L 137(H)  AST 15 - 41 U/L 22  ALT 14 - 54 U/L 16    Discharge instruction: per After Visit Summary and "Baby and Me Booklet".  After visit meds:  Allergies as of 01/22/2017      Reactions   Aspirin Itching      Medication List    TAKE these medications   albuterol 108 (90 Base) MCG/ACT inhaler Commonly known as:  PROVENTIL HFA;VENTOLIN HFA Inhale 1-2 puffs into the lungs every 6 (six) hours as needed for wheezing or shortness of breath.   cyclobenzaprine 5 MG tablet Commonly known as:  FLEXERIL Take 1 tablet (5 mg total) by mouth 3 (three) times daily as needed for muscle spasms.   diphenhydramine-acetaminophen 25-500 MG Tabs tablet Commonly known as:  TYLENOL PM Take 2 tablets by mouth at bedtime as needed (for sleep).   ibuprofen 600 MG tablet Commonly known as:  ADVIL,MOTRIN Take 1 tablet (600 mg total) by mouth every 6 (six) hours.   medroxyPROGESTERone 150 MG/ML injection Commonly known as:  DEPO-PROVERA Inject 1 mL (150 mg total) into the muscle every 3 (three) months. Bring to office for administration.   oxyCODONE 5 MG immediate release tablet Commonly known as:  Oxy IR/ROXICODONE Take 1 tablet (5 mg total) by mouth every 4 (four) hours as needed (pain scale 4-7).   prenatal multivitamin Tabs tablet Take 1 tablet by mouth daily.   senna-docusate 8.6-50 MG tablet Commonly known as:  Senokot-S Take 2 tablets by mouth at bedtime.       Diet: routine diet  Activity:  Advance as tolerated. Pelvic rest for 6 weeks.   Outpatient follow up:4 weeks Follow up Appt:No future appointments. Follow up Visit:No Follow-up on file.  Postpartum contraception: Depo Provera  Newborn Data: Live born female  Birth Weight: 6 lb 3.3 oz (2815 g) APGAR: 8, 9  Baby Feeding: Bottle Disposition:home with mother   01/22/2017 Roe Coombs, CNM

## 2017-01-22 NOTE — Discharge Instructions (Signed)

## 2017-02-02 ENCOUNTER — Other Ambulatory Visit: Payer: Self-pay | Admitting: Obstetrics

## 2017-02-02 ENCOUNTER — Ambulatory Visit (INDEPENDENT_AMBULATORY_CARE_PROVIDER_SITE_OTHER): Payer: Medicaid Other

## 2017-02-02 DIAGNOSIS — Z3202 Encounter for pregnancy test, result negative: Secondary | ICD-10-CM | POA: Diagnosis not present

## 2017-02-02 DIAGNOSIS — Z30013 Encounter for initial prescription of injectable contraceptive: Secondary | ICD-10-CM

## 2017-02-02 DIAGNOSIS — Z3042 Encounter for surveillance of injectable contraceptive: Secondary | ICD-10-CM

## 2017-02-02 LAB — POCT URINE PREGNANCY: Preg Test, Ur: NEGATIVE

## 2017-02-02 MED ORDER — MEDROXYPROGESTERONE ACETATE 150 MG/ML IM SUSP: 150.0000 mg | INTRAMUSCULAR | 4 refills | Status: DC

## 2017-02-02 NOTE — Progress Notes (Signed)
Pt brought in Depo, but states that she had IC 2 days ago. UPT done. Negative results. Pt advised to come back in 2 weeks for repeat UPT

## 2017-02-16 ENCOUNTER — Ambulatory Visit (INDEPENDENT_AMBULATORY_CARE_PROVIDER_SITE_OTHER): Payer: Medicaid Other

## 2017-02-16 DIAGNOSIS — Z3202 Encounter for pregnancy test, result negative: Secondary | ICD-10-CM

## 2017-02-16 DIAGNOSIS — Z30013 Encounter for initial prescription of injectable contraceptive: Secondary | ICD-10-CM

## 2017-02-16 DIAGNOSIS — Z3042 Encounter for surveillance of injectable contraceptive: Secondary | ICD-10-CM

## 2017-02-16 LAB — POCT URINE PREGNANCY: Preg Test, Ur: NEGATIVE

## 2017-02-16 MED ORDER — MEDROXYPROGESTERONE ACETATE 150 MG/ML IM SUSP
150.0000 mg | INTRAMUSCULAR | Status: DC
  Administered 2017-02-16 – 2018-07-17 (×3): 150 mg via INTRAMUSCULAR

## 2017-02-16 NOTE — Progress Notes (Signed)
Patient is in the office for 2nd UPT/depo start. UPT is negative, administered injection, and pt tolerated well. .. Administrations This Visit    medroxyPROGESTERone (DEPO-PROVERA) injection 150 mg    Admin Date 02/16/2017 Action Given Dose 150 mg Route Intramuscular Administered By Katrina StackStalling, Brittany D, RN

## 2017-03-02 ENCOUNTER — Ambulatory Visit (INDEPENDENT_AMBULATORY_CARE_PROVIDER_SITE_OTHER): Payer: Medicaid Other | Admitting: Obstetrics

## 2017-03-02 ENCOUNTER — Encounter: Payer: Self-pay | Admitting: Obstetrics

## 2017-03-02 DIAGNOSIS — B3731 Acute candidiasis of vulva and vagina: Secondary | ICD-10-CM

## 2017-03-02 DIAGNOSIS — B373 Candidiasis of vulva and vagina: Secondary | ICD-10-CM

## 2017-03-02 MED ORDER — FLUCONAZOLE 150 MG PO TABS: 150.0000 mg | ORAL_TABLET | Freq: Once | ORAL | 2 refills | Status: AC

## 2017-03-02 NOTE — Progress Notes (Signed)
Patient presents for postpartum exam.  No complaints.  Received Depo Provera at discharge from hospital.

## 2017-03-02 NOTE — Progress Notes (Signed)
..  Post Partum Exam  Erica BergeronMarkisa L Mack is a 31 y.o. [phone] female who presents for a postpartum visit. She is 6 weeks postpartum following a spontaneous vaginal delivery. I have fully reviewed the prenatal and intrapartum course. The delivery was at 39.3 gestational weeks.  Anesthesia: epidural. Postpartum course has been normal. Baby's course has been normal. Baby is feeding by bottle - Similac Advance. Bleeding no bleeding. Bowel function is normal. Bladder function is normal. Patient is sexually active. Contraception method is Depo-Provera injections. Postpartum depression screening:neg  The following portions of the patient's history were reviewed and updated as appropriate: allergies, current medications, past family history, past medical history, past social history, past surgical history and problem list.  Review of Systems A comprehensive review of systems was negative.    Objective:  unknown if currently breastfeeding.  General:  alert and no distress   Breasts:  inspection negative, no nipple discharge or bleeding, no masses or nodularity palpable  Lungs: clear to auscultation bilaterally  Heart:  regular rate and rhythm, S1, S2 normal, no murmur, click, rub or gallop  Abdomen: soft, non-tender; bowel sounds normal; no masses,  no organomegaly   Vulva:  normal  Vagina: normal vagina, no discharge, exudate, lesion, or erythema  Cervix:  no cervical motion tenderness  Corpus: normal size, contour, position, consistency, mobility, non-tender  Adnexa:  no mass, fullness, tenderness  Rectal Exam: Not performed.        Assessment:    Normal postpartum exam. Pap smear not done at today's visit.   Contraceptive Surveillance.  Pleased with Depo Provera.  Plan:   1. Contraception: Depo-Provera injections 2. Depo Provera Rx 3. Follow up in: 6 months or as needed.

## 2017-03-05 ENCOUNTER — Telehealth: Payer: Self-pay

## 2017-03-05 NOTE — Telephone Encounter (Signed)
Patient called in and stated that her RTW note is dated for the 14th and should be today the 11th. Pt states that she is at work now and needs new letter faxed so that they will allow her to stay.

## 2017-03-09 ENCOUNTER — Telehealth: Payer: Self-pay

## 2017-03-09 ENCOUNTER — Other Ambulatory Visit: Payer: Self-pay | Admitting: Obstetrics

## 2017-03-09 DIAGNOSIS — Z975 Presence of (intrauterine) contraceptive device: Secondary | ICD-10-CM

## 2017-03-09 DIAGNOSIS — N939 Abnormal uterine and vaginal bleeding, unspecified: Secondary | ICD-10-CM

## 2017-03-09 MED ORDER — NORETHINDRONE-ETH ESTRADIOL 1-35 MG-MCG PO TABS: 1.0000 | ORAL_TABLET | Freq: Every day | ORAL | 0 refills | Status: DC

## 2017-03-09 NOTE — Telephone Encounter (Signed)
Patient called stating that she got her first depo, and her cycle started two weeks ago. Pt states that she is still bleeding, and is now passing blood clots. Please advise

## 2017-04-12 ENCOUNTER — Inpatient Hospital Stay (HOSPITAL_COMMUNITY)
Admission: AD | Admit: 2017-04-12 | Discharge: 2017-04-12 | Disposition: A | Discharge status: Home / Self Care | Payer: Medicaid Other | Source: Ambulatory Visit | Attending: Obstetrics and Gynecology | Admitting: Obstetrics and Gynecology

## 2017-04-12 ENCOUNTER — Encounter (HOSPITAL_COMMUNITY): Payer: Self-pay

## 2017-04-12 DIAGNOSIS — N939 Abnormal uterine and vaginal bleeding, unspecified: Secondary | ICD-10-CM | POA: Diagnosis not present

## 2017-04-12 DIAGNOSIS — N921 Excessive and frequent menstruation with irregular cycle: Secondary | ICD-10-CM | POA: Diagnosis not present

## 2017-04-12 DIAGNOSIS — R109 Unspecified abdominal pain: Secondary | ICD-10-CM | POA: Diagnosis present

## 2017-04-12 LAB — CBC
HCT: 38.3 % (ref 36.0–46.0)
Hemoglobin: 12.9 g/dL (ref 12.0–15.0)
MCH: 28.2 pg (ref 26.0–34.0)
MCHC: 33.7 g/dL (ref 30.0–36.0)
MCV: 83.8 fL (ref 78.0–100.0)
PLATELETS: 317 10*3/uL (ref 150–400)
RBC: 4.57 MIL/uL (ref 3.87–5.11)
RDW: 15.3 % (ref 11.5–15.5)
WBC: 9.2 10*3/uL (ref 4.0–10.5)

## 2017-04-12 LAB — POCT PREGNANCY, URINE: PREG TEST UR: NEGATIVE

## 2017-04-12 LAB — URINALYSIS, ROUTINE W REFLEX MICROSCOPIC
BILIRUBIN URINE: NEGATIVE
Glucose, UA: NEGATIVE mg/dL
Ketones, ur: NEGATIVE mg/dL
Leukocytes, UA: NEGATIVE
Nitrite: NEGATIVE
PROTEIN: 100 mg/dL — AB
Specific Gravity, Urine: 1.016 (ref 1.005–1.030)
pH: 9 — ABNORMAL HIGH (ref 5.0–8.0)

## 2017-04-12 LAB — URINALYSIS, MICROSCOPIC (REFLEX)

## 2017-04-12 MED ORDER — NORETHINDRONE-ETH ESTRADIOL 1-35 MG-MCG PO TABS: 1.0000 | ORAL_TABLET | Freq: Every day | ORAL | 1 refills | Status: DC

## 2017-04-12 NOTE — MAU Note (Signed)
Pt has been having abd pain off and on for a month. Stated she has had vaginal bleeding since she had the baby 2.5 months ago. Went to post partum appointment and Dr. Clearance CootsHarper gave her Depo shot. Bled for 3 weeks after that and then was givnen birth control pills . Bleeding never got better.

## 2017-04-12 NOTE — [facility] Provider Note (Signed)
History   Chief Complaint:  Abdominal Pain   Erica BergeronMarkisa L Mack is 31 y.o. [id number] No LMP recorded..  Her pregnancy status is negative. Abdominal Pain  Patient with one month of abdominal pain located in the lower abdomen. Feels like a "bad menstrual cramp." No nausea, vomiting, diarrhea. No ameliorating or aggravating factors or changes in appetite. She also has had vaginal bleeding since SVD 01/22/17. She occasionally passes clots less than the size of a quarter. She has felt weak and today has a pulsatile headache in her forehead. She endorses numbness and tingling in her arms and legs. She received first Depo injection 5/25. She saw Dr. Clearance CootsHarper for the bleeding and was given one month of OCPs, which lightened the bleeding but did not stop it.   OB History    Gravida Para Term Preterm AB Living   5 3 3   2 3    SAB TAB Ectopic Multiple Live Births     2   0 3       Past Medical History:  Diagnosis Date  . Asthma   . Chlamydia contact, treated   . Gonorrhea   . Headache(784.0)   . Hypertension    no meds; dx 2008    Past Surgical History:  Procedure Laterality Date  . DILATION AND CURETTAGE OF UTERUS    . INDUCED ABORTION      Family History  Problem Relation Age of Onset  . Hypertension Mother   . Diabetes Brother   . Hypertension Maternal Aunt   . Heart disease Maternal Aunt   . Cancer Maternal Grandmother   . Hypertension Maternal Grandmother   . Anesthesia problems Neg Hx     Social History  Substance Use Topics  . Smoking status: Never Smoker  . Smokeless tobacco: Never Used  . Alcohol use 3.3 oz/week    3 Shots of liquor, 3 Standard drinks or equivalent per week     Comment: occasional alcohol    Allergies:  Allergies  Allergen Reactions  . Aspirin Itching    Facility-Administered Medications Prior to Admission  Medication Dose Route Frequency Provider Last Rate Last Dose  . medroxyPROGESTERone (DEPO-PROVERA) injection 150 mg  150 mg Intramuscular  Q90 days Brock BadHarper, Charles A, MD   150 mg at 02/16/17 1117   Prescriptions Prior to Admission  Medication Sig Dispense Refill Last Dose  . albuterol (PROVENTIL HFA;VENTOLIN HFA) 108 (90 Base) MCG/ACT inhaler Inhale 1-2 puffs into the lungs every 6 (six) hours as needed for wheezing or shortness of breath. (Patient not taking: Reported on 03/02/2017) 1 Inhaler 0 Not Taking  . cyclobenzaprine (FLEXERIL) 5 MG tablet Take 1 tablet (5 mg total) by mouth 3 (three) times daily as needed for muscle spasms. (Patient not taking: Reported on 03/02/2017) 20 tablet 0 Not Taking  . diphenhydramine-acetaminophen (TYLENOL PM) 25-500 MG TABS tablet Take 2 tablets by mouth at bedtime as needed (for sleep).    Not Taking  . ibuprofen (ADVIL,MOTRIN) 600 MG tablet Take 1 tablet (600 mg total) by mouth every 6 (six) hours. 120 tablet 2 Taking  . medroxyPROGESTERone (DEPO-PROVERA) 150 MG/ML injection Inject 1 mL (150 mg total) into the muscle every 3 (three) months. Bring to office for administration. 1 mL 4 Taking  . norethindrone-ethinyl estradiol 1/35 (ORTHO-NOVUM 1/35, 28,) tablet Take 1 tablet by mouth daily. 1 Package 0   . oxyCODONE (OXY IR/ROXICODONE) 5 MG immediate release tablet Take 1 tablet (5 mg total) by mouth every 4 (four) hours  as needed (pain scale 4-7). (Patient not taking: Reported on 03/02/2017) 30 tablet 0 Not Taking  . Prenatal Vit-Fe Fumarate-FA (PRENATAL MULTIVITAMIN) TABS tablet Take 1 tablet by mouth daily.   Not Taking  . senna-docusate (SENOKOT-S) 8.6-50 MG tablet Take 2 tablets by mouth at bedtime. (Patient not taking: Reported on 03/02/2017) 60 tablet 2 Not Taking    Review of Systems - History obtained from the patient General ROS: positive for  - fatigue and malaise negative for - chills, fever, night sweats, weight gain or weight loss Psychological ROS: negative Respiratory ROS: no cough, shortness of breath, or wheezing Cardiovascular ROS: no chest pain or dyspnea on exertion negative for -  chest pain, dyspnea on exertion, palpitations or shortness of breath Gastrointestinal ROS: positive for - abdominal pain negative for - appetite loss, constipation, diarrhea, heartburn, hematemesis or nausea/vomiting Genito-Urinary ROS: positive for - vulvar/vaginal symptoms negative for - dysuria, hematuria or incontinence Neurological ROS: positive for - numbness/tingling negative for - bowel and bladder control changes, confusion or gait disturbance  Physical Exam   Blood pressure (!) 147/92, pulse 79, temperature 98.6 F (37 C), temperature source Oral, resp. rate 18, height 5\' 6"  (1.676 m), weight 95.3 kg (210 lb), not currently breastfeeding.  General: General appearance - alert, well appearing, and in no distress, oriented to person, place, and time and well hydrated Mental status - alert, oriented to person, place, and time, normal mood, behavior, speech, dress, motor activity, and thought processes Chest - clear to auscultation, no wheezes, rales or rhonchi, symmetric air entry Heart - normal rate, regular rhythm, normal S1, S2, no murmurs, rubs, clicks or gallops Abdomen - soft, nontender, nondistended, no masses or organomegaly   Labs: Results for orders placed or performed during the hospital encounter of 04/12/17 (from the past 24 hour(s))  CBC   Collection Time: 04/12/17  3:48 PM  Result Value Ref Range   WBC 9.2 4.0 - 10.5 K/uL   RBC 4.57 3.87 - 5.11 MIL/uL   Hemoglobin 12.9 12.0 - 15.0 g/dL   HCT 16.1 09.6 - 04.5 %   MCV 83.8 78.0 - 100.0 fL   MCH 28.2 26.0 - 34.0 pg   MCHC 33.7 30.0 - 36.0 g/dL   RDW 40.9 81.1 - 91.4 %   Platelets 317 150 - 400 K/uL      Assessment and Plan: Patient Active Problem List   Diagnosis Date Noted  . Gestational HTN 01/20/2017  . Supervision of normal pregnancy, antepartum 07/10/2016  . History of migraine 07/10/2016    Breakthrough bleeding associated with Depo Provera -CBC with diff normal -One month trial of OCPs for  breakthrough bleeding. If bleeding continues after first pack, begin month 2 of OCPs -Symptomatic management for heavy bleeding including Ibuprofen and heating pads for cramping -Follow up with regular provider for abnormal uterine bleeding     Jeanie Cooks 04/12/2017, 4:08 PM  I confirm that I have verified the information documented in the Med student's note and that I have also personally reperformed the physical exam and all medical decision making activities.  Discussed expected irregular bleeding with initiation of DepoProvera and that it usually resolves by 3-6 months.  Will use OCPs for 1-2 more months.  I told her to use the first month then stop them to see how she does.  If still bleeds, then take a second month. DVT/VTE precautions  Followup in office PRN  Aviva Signs, CNM

## 2017-04-12 NOTE — Discharge Instructions (Signed)
Dysfunctional Uterine Bleeding Dysfunctional uterine bleeding is abnormal bleeding from the uterus. Dysfunctional uterine bleeding includes:  A period that comes earlier or later than usual.  A period that is lighter, heavier, or has blood clots.  Bleeding between periods.  Skipping one or more periods.  Bleeding after sexual intercourse.  Bleeding after menopause.  Follow these instructions at home: Pay attention to any changes in your symptoms. Follow these instructions to help with your condition: Eating and drinking  Eat well-balanced meals. Include foods that are high in iron, such as liver, meat, shellfish, green leafy vegetables, and eggs.  If you become constipated: ? Drink plenty of water. ? Eat fruits and vegetables that are high in water and fiber, such as spinach, carrots, raspberries, apples, and mango. Medicines  Take over-the-counter and prescription medicines only as told by your health care provider.  Do not change medicines without talking with your health care provider.  Aspirin or medicines that contain aspirin may make the bleeding worse. Do not take those medicines: ? During the week before your period. ? During your period.  If you were prescribed iron pills, take them as told by your health care provider. Iron pills help to replace iron that your body loses because of this condition. Activity  If you need to change your sanitary pad or tampon more than one time every 2 hours: ? Lie in bed with your feet raised (elevated). ? Place a cold pack on your lower abdomen. ? Rest as much as possible until the bleeding stops or slows down.  Do not try to lose weight until the bleeding has stopped and your blood iron level is back to normal. Other Instructions  For two months, write down: ? When your period starts. ? When your period ends. ? When any abnormal bleeding occurs. ? What problems you notice.  Keep all follow up visits as told by your health  care provider. This is important. Contact a health care provider if:  You get light-headed or weak.  You have nausea and vomiting.  You cannot eat or drink without vomiting.  You feel dizzy or have diarrhea while you are taking medicines.  You are taking birth control pills or hormones, and you want to change them or stop taking them. Get help right away if:  You develop a fever or chills.  You need to change your sanitary pad or tampon more than one time per hour.  Your bleeding becomes heavier, or your flow contains clots more often.  You develop pain in your abdomen.  You lose consciousness.  You develop a rash. This information is not intended to replace advice given to you by your health care provider. Make sure you discuss any questions you have with your health care provider. Document Released: 09/08/2000 Document Revised: 02/17/2016 Document Reviewed: 12/07/2014 Elsevier Interactive Patient Education  2018 [employer]. Medroxyprogesterone injection [Contraceptive] What is this medicine? MEDROXYPROGESTERONE (me DROX ee proe JES te rone) contraceptive injections prevent pregnancy. They provide effective birth control for 3 months. Depo-subQ Provera 104 is also used for treating pain related to endometriosis. This medicine may be used for other purposes; ask your health care provider or pharmacist if you have questions. COMMON BRAND NAME(S): Depo-Provera, Depo-subQ Provera 104 What should I tell my health care provider before I take this medicine? They need to know if you have any of these conditions: -frequently drink alcohol -asthma -blood vessel disease or a history of a blood clot in the lungs or  legs -bone disease such as osteoporosis -breast cancer -diabetes -eating disorder (anorexia nervosa or bulimia) -high blood pressure -HIV infection or AIDS -kidney disease -liver disease -mental depression -migraine -seizures (convulsions) -stroke -tobacco  smoker -vaginal bleeding -an unusual or allergic reaction to medroxyprogesterone, other hormones, medicines, foods, dyes, or preservatives -pregnant or trying to get pregnant -breast-feeding How should I use this medicine? Depo-Provera Contraceptive injection is given into a muscle. Depo-subQ Provera 104 injection is given under the skin. These injections are given by a health care professional. You must not be pregnant before getting an injection. The injection is usually given during the first 5 days after the start of a menstrual period or 6 weeks after delivery of a baby. Talk to your pediatrician regarding the use of this medicine in children. Special care may be needed. These injections have been used in female children who have started having menstrual periods. Overdosage: If you think you have taken too much of this medicine contact a poison control center or emergency room at once. NOTE: This medicine is only for you. Do not share this medicine with others. What if I miss a dose? Try not to miss a dose. You must get an injection once every 3 months to maintain birth control. If you cannot keep an appointment, call and reschedule it. If you wait longer than 13 weeks between Depo-Provera contraceptive injections or longer than 14 weeks between Depo-subQ Provera 104 injections, you could get pregnant. Use another method for birth control if you miss your appointment. You may also need a pregnancy test before receiving another injection. What may interact with this medicine? Do not take this medicine with any of the following medications: -bosentan This medicine may also interact with the following medications: -aminoglutethimide -antibiotics or medicines for infections, especially rifampin, rifabutin, rifapentine, and griseofulvin -aprepitant -barbiturate medicines such as phenobarbital or primidone -bexarotene -carbamazepine -medicines for seizures like ethotoin, felbamate,  oxcarbazepine, phenytoin, topiramate -modafinil -St. John's wort This list may not describe all possible interactions. Give your health care provider a list of all the medicines, herbs, non-prescription drugs, or dietary supplements you use. Also tell them if you smoke, drink alcohol, or use illegal drugs. Some items may interact with your medicine. What should I watch for while using this medicine? This drug does not protect you against HIV infection (AIDS) or other sexually transmitted diseases. Use of this product may cause you to lose calcium from your bones. Loss of calcium may cause weak bones (osteoporosis). Only use this product for more than 2 years if other forms of birth control are not right for you. The longer you use this product for birth control the more likely you will be at risk for weak bones. Ask your health care professional how you can keep strong bones. You may have a change in bleeding pattern or irregular periods. Many females stop having periods while taking this drug. If you have received your injections on time, your chance of being pregnant is very low. If you think you may be pregnant, see your health care professional as soon as possible. Tell your health care professional if you want to get pregnant within the next year. The effect of this medicine may last a long time after you get your last injection. What side effects may I notice from receiving this medicine? Side effects that you should report to your doctor or health care professional as soon as possible: -allergic reactions like skin rash, itching or hives, swelling of the face,  lips, or tongue -breast tenderness or discharge -breathing problems -changes in vision -depression -feeling faint or lightheaded, falls -fever -pain in the abdomen, chest, groin, or leg -problems with balance, talking, walking -unusually weak or tired -yellowing of the eyes or skin Side effects that usually do not require medical  attention (report to your doctor or health care professional if they continue or are bothersome): -acne -fluid retention and swelling -headache -irregular periods, spotting, or absent periods -temporary pain, itching, or skin reaction at site where injected -weight gain This list may not describe all possible side effects. Call your doctor for medical advice about side effects. You may report side effects to FDA at 1-800-FDA-1088. Where should I keep my medicine? This does not apply. The injection will be given to you by a health care professional. NOTE: This sheet is a summary. It may not cover all possible information. If you have questions about this medicine, talk to your doctor, pharmacist, or health care provider.  2018 Elsevier/Gold Standard (2008-10-02 18:37:56)

## 2017-04-13 LAB — URINE CULTURE

## 2017-05-10 ENCOUNTER — Ambulatory Visit: Payer: Medicaid Other

## 2017-05-10 ENCOUNTER — Ambulatory Visit (INDEPENDENT_AMBULATORY_CARE_PROVIDER_SITE_OTHER): Payer: Medicaid Other | Admitting: Obstetrics

## 2017-05-10 ENCOUNTER — Encounter: Payer: Self-pay | Admitting: Obstetrics

## 2017-05-10 VITALS — BP 142/99 | HR 80 | Wt 212.0 lb

## 2017-05-10 VITALS — BP 136/84

## 2017-05-10 DIAGNOSIS — R030 Elevated blood-pressure reading, without diagnosis of hypertension: Secondary | ICD-10-CM

## 2017-05-10 DIAGNOSIS — N939 Abnormal uterine and vaginal bleeding, unspecified: Secondary | ICD-10-CM | POA: Diagnosis not present

## 2017-05-10 DIAGNOSIS — I10 Essential (primary) hypertension: Secondary | ICD-10-CM

## 2017-05-10 DIAGNOSIS — Z3042 Encounter for surveillance of injectable contraceptive: Secondary | ICD-10-CM | POA: Diagnosis not present

## 2017-05-10 MED ORDER — TRIAMTERENE-HCTZ 37.5-25 MG PO CAPS: 1.0000 | ORAL_CAPSULE | Freq: Every day | ORAL | 11 refills | Status: DC

## 2017-05-10 NOTE — Addendum Note (Signed)
Addended by: Coral CeoHARPER, Shreya Lacasse A on: 05/10/2017 02:32 PM   Modules accepted: Orders

## 2017-05-10 NOTE — Progress Notes (Signed)
Patient is in the office for GYN visit, complains of irregular bleeding, for the last 2 weeks, pt is currently on depo. Pt states that she would like to stay on depo and get her shot today because symptoms are better.

## 2017-05-10 NOTE — Progress Notes (Signed)
Subjective:  Erica Mack is a 31 y.o. female with hypertension. Current Outpatient Prescriptions  Medication Sig Dispense Refill  . albuterol (PROVENTIL HFA;VENTOLIN HFA) 108 (90 Base) MCG/ACT inhaler Inhale 1-2 puffs into the lungs every 6 (six) hours as needed for wheezing or shortness of breath. (Patient not taking: Reported on 03/02/2017) 1 Inhaler 0  . cyclobenzaprine (FLEXERIL) 5 MG tablet Take 1 tablet (5 mg total) by mouth 3 (three) times daily as needed for muscle spasms. (Patient not taking: Reported on 03/02/2017) 20 tablet 0  . diphenhydramine-acetaminophen (TYLENOL PM) 25-500 MG TABS tablet Take 2 tablets by mouth at bedtime as needed (for sleep).     Marland Kitchen. ibuprofen (ADVIL,MOTRIN) 600 MG tablet Take 1 tablet (600 mg total) by mouth every 6 (six) hours. (Patient not taking: Reported on 05/10/2017) 120 tablet 2  . medroxyPROGESTERone (DEPO-PROVERA) 150 MG/ML injection Inject 1 mL (150 mg total) into the muscle every 3 (three) months. Bring to office for administration. 1 mL 4  . norethindrone-ethinyl estradiol 1/35 (ORTHO-NOVUM 1/35, 28,) tablet Take 1 tablet by mouth daily. (Patient not taking: Reported on 05/10/2017) 1 Package 1  . oxyCODONE (OXY IR/ROXICODONE) 5 MG immediate release tablet Take 1 tablet (5 mg total) by mouth every 4 (four) hours as needed (pain scale 4-7). (Patient not taking: Reported on 03/02/2017) 30 tablet 0  . Prenatal Vit-Fe Fumarate-FA (PRENATAL MULTIVITAMIN) TABS tablet Take 1 tablet by mouth daily.    Marland Kitchen. senna-docusate (SENOKOT-S) 8.6-50 MG tablet Take 2 tablets by mouth at bedtime. (Patient not taking: Reported on 03/02/2017) 60 tablet 2   Current Facility-Administered Medications  Medication Dose Route Frequency Provider Last Rate Last Dose  . medroxyPROGESTERone (DEPO-PROVERA) injection 150 mg  150 mg Intramuscular Q90 days Brock BadHarper, Charles A, MD   150 mg at 05/10/17 1034    Hypertension ROS: Does not have BP meds.  New concerns: None   Objective:  BP 136/84    Appearance alert, well appearing, and in no distress. General exam BP noted to be well controlled today in office.    Assessment:   Hypertension borderline controlled   Plan:  Pt to start HCTZ.

## 2017-05-10 NOTE — Progress Notes (Addendum)
Patient ID: Erica HeysMarkisa L Mack, female   DOB: 02/20/1986, 31 y.o.   MRN: [id number]  Chief Complaint  Patient presents with  . GYN    HPI Erica Mack is a 31 y.o. female.  Irregular bleeding after starting Depo Provera injections, now improved. HPI  Past Medical History:  Diagnosis Date  . Asthma   . Chlamydia contact, treated   . Gonorrhea   . Headache(784.0)   . Hypertension    no meds; dx 2008    Past Surgical History:  Procedure Laterality Date  . DILATION AND CURETTAGE OF UTERUS    . INDUCED ABORTION      Family History  Problem Relation Age of Onset  . Hypertension Mother   . Diabetes Brother   . Hypertension Maternal Aunt   . Heart disease Maternal Aunt   . Cancer Maternal Grandmother   . Hypertension Maternal Grandmother   . Anesthesia problems Neg Hx     Social History Social History  Substance Use Topics  . Smoking status: Never Smoker  . Smokeless tobacco: Never Used  . Alcohol use 3.3 oz/week    3 Shots of liquor, 3 Standard drinks or equivalent per week     Comment: occasional alcohol    Allergies  Allergen Reactions  . Aspirin Itching    Current Outpatient Prescriptions  Medication Sig Dispense Refill  . medroxyPROGESTERone (DEPO-PROVERA) 150 MG/ML injection Inject 1 mL (150 mg total) into the muscle every 3 (three) months. Bring to office for administration. 1 mL 4  . albuterol (PROVENTIL HFA;VENTOLIN HFA) 108 (90 Base) MCG/ACT inhaler Inhale 1-2 puffs into the lungs every 6 (six) hours as needed for wheezing or shortness of breath. (Patient not taking: Reported on 03/02/2017) 1 Inhaler 0  . cyclobenzaprine (FLEXERIL) 5 MG tablet Take 1 tablet (5 mg total) by mouth 3 (three) times daily as needed for muscle spasms. (Patient not taking: Reported on 03/02/2017) 20 tablet 0  . diphenhydramine-acetaminophen (TYLENOL PM) 25-500 MG TABS tablet Take 2 tablets by mouth at bedtime as needed (for sleep).     Marland Kitchen. ibuprofen (ADVIL,MOTRIN) 600 MG tablet Take 1  tablet (600 mg total) by mouth every 6 (six) hours. (Patient not taking: Reported on 05/10/2017) 120 tablet 2  . norethindrone-ethinyl estradiol 1/35 (ORTHO-NOVUM 1/35, 28,) tablet Take 1 tablet by mouth daily. (Patient not taking: Reported on 05/10/2017) 1 Package 1  . oxyCODONE (OXY IR/ROXICODONE) 5 MG immediate release tablet Take 1 tablet (5 mg total) by mouth every 4 (four) hours as needed (pain scale 4-7). (Patient not taking: Reported on 03/02/2017) 30 tablet 0  . Prenatal Vit-Fe Fumarate-FA (PRENATAL MULTIVITAMIN) TABS tablet Take 1 tablet by mouth daily.    Marland Kitchen. senna-docusate (SENOKOT-S) 8.6-50 MG tablet Take 2 tablets by mouth at bedtime. (Patient not taking: Reported on 03/02/2017) 60 tablet 2   Current Facility-Administered Medications  Medication Dose Route Frequency Provider Last Rate Last Dose  . medroxyPROGESTERone (DEPO-PROVERA) injection 150 mg  150 mg Intramuscular Q90 days Brock BadHarper, Delcia Spitzley A, MD   150 mg at 05/10/17 1034    Review of Systems Review of Systems Constitutional: negative for fatigue and weight loss Respiratory: negative for cough and wheezing Cardiovascular: negative for chest pain, fatigue and palpitations Gastrointestinal: negative for abdominal pain and change in bowel habits Genitourinary:positive for irregular bleeding on Depo Provera Integument/breast: negative for nipple discharge Musculoskeletal:negative for myalgias Neurological: negative for gait problems and tremors Behavioral/Psych: negative for abusive relationship, depression Endocrine: negative for temperature intolerance  Blood pressure (!) 142/99, pulse 80, weight 212 lb (96.2 kg), not currently breastfeeding.  Physical Exam Physical Exam:  Deferred  >50% of 10 min visit spent on counseling and coordination of care.    Data Reviewed Wet Prep and Cultures  Assessment     1. Abnormal uterine bleeding (AUB) - stable now on Depo Provera  2. Encounter for surveillance of injectable  contraceptive - doing well    3. Elevated Blood Pressure - RTC in 1 week for repeat BP - Dyazide 37.5 - 25 mg p.o. Daily in am  Plan    Follow up in 3 months or prn   No orders of the defined types were placed in this encounter.  No orders of the defined types were placed in this encounter.

## 2017-05-18 IMAGING — US US OB TRANSVAGINAL
1 series · 15 of 28 positions shown · non-contrast
Comparison: 05/22/2016

CLINICAL DATA: Follow up from prior ultrasound with probable early
gestational sac on 05/22/2016. Abdominal pain for 2 weeks.

EXAM:
TRANSVAGINAL OB ULTRASOUND
TECHNIQUE: Transvaginal ultrasound was performed for complete evaluation of the
gestation as well as the maternal uterus, adnexal regions, and
pelvic cul-de-sac.

[Series 1: us ob transvaginal · 15 of 41 slices shown]
[im 1/41]
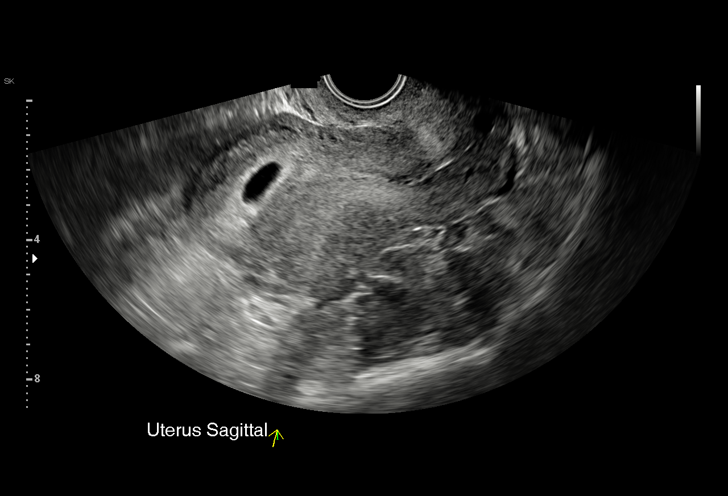
[im 3/41]
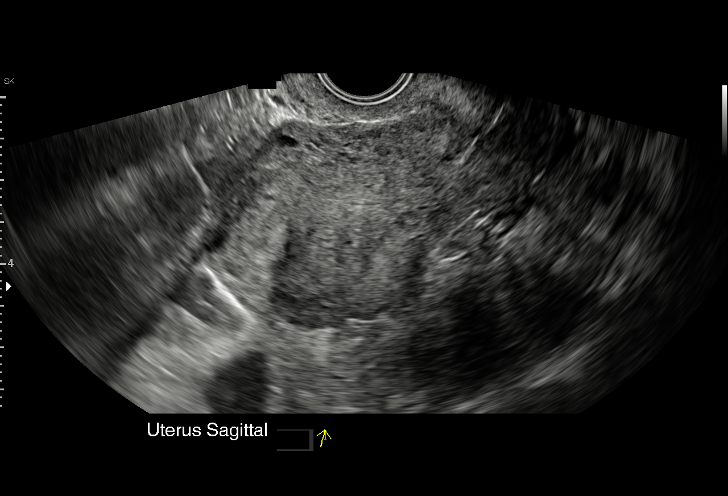
[im 6/41]
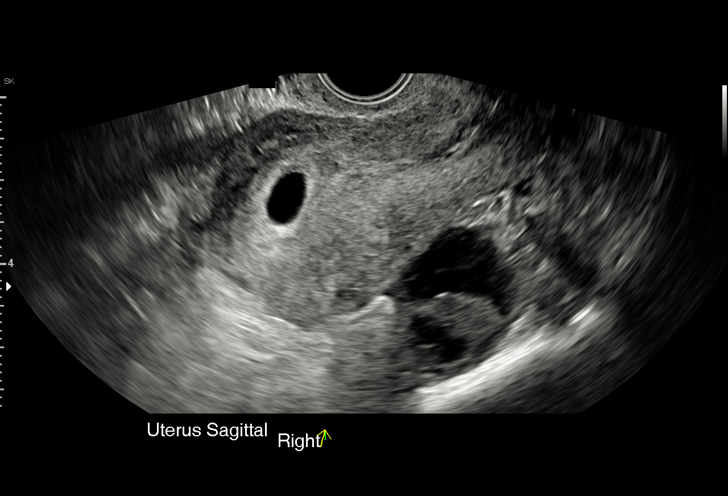
[im 9/41]
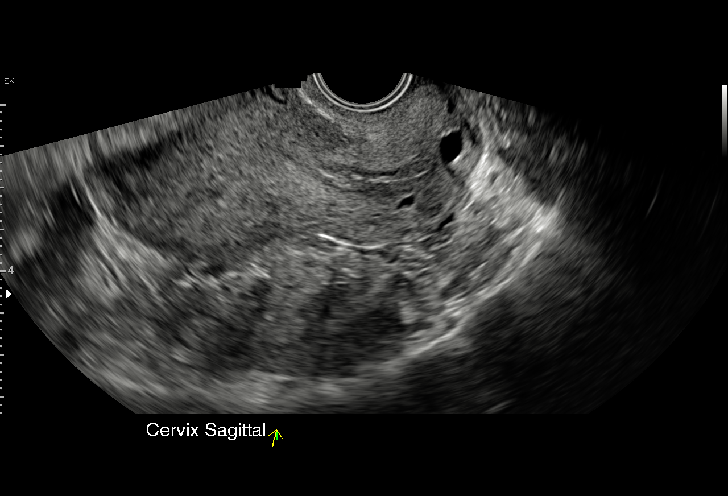
[im 12/41]
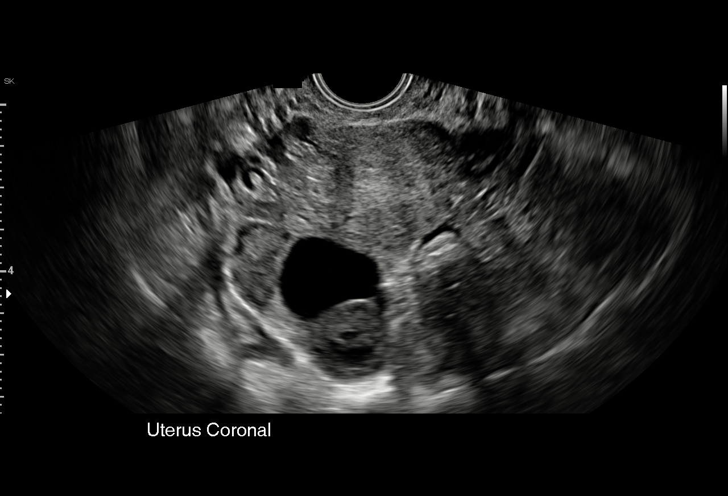
[im 15/41]
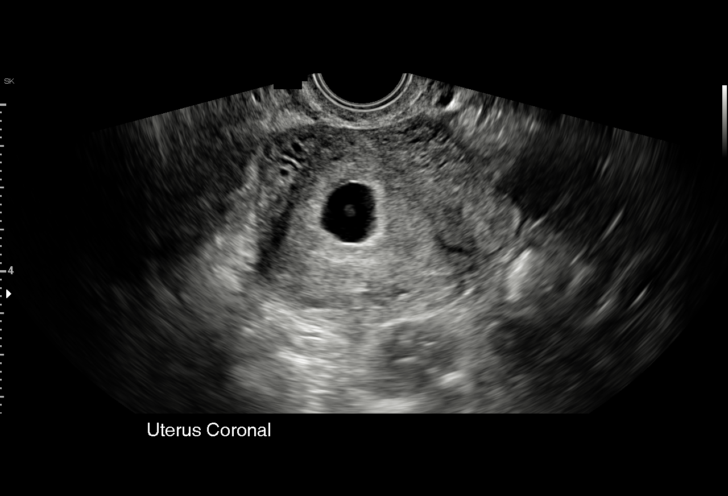
[im 18/41]
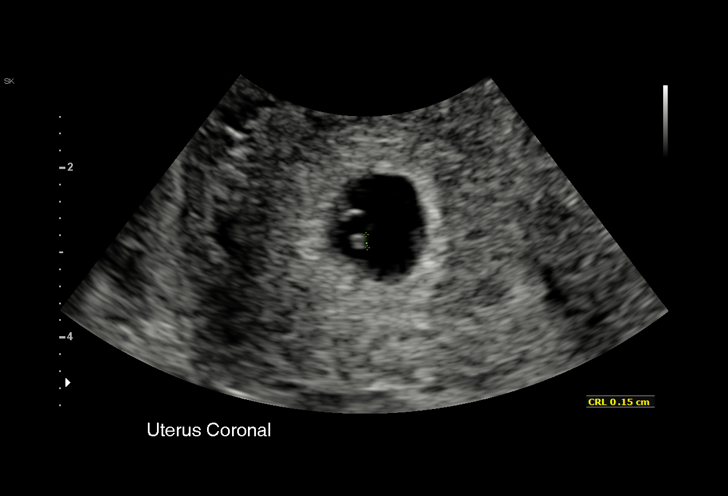
[im 21/41]
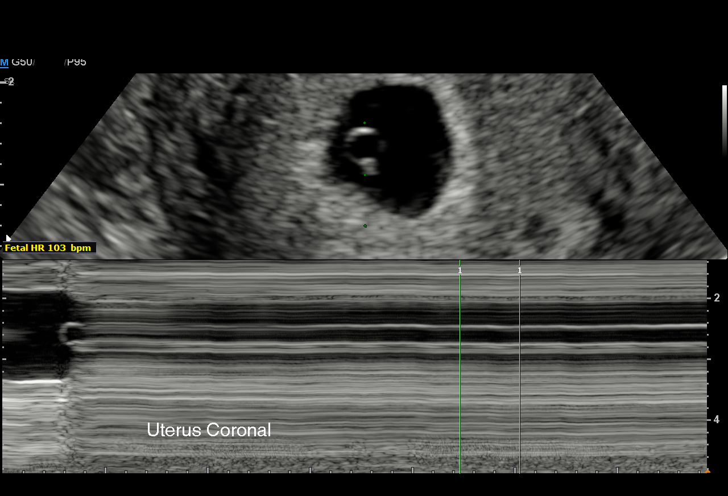
[im 23/41]
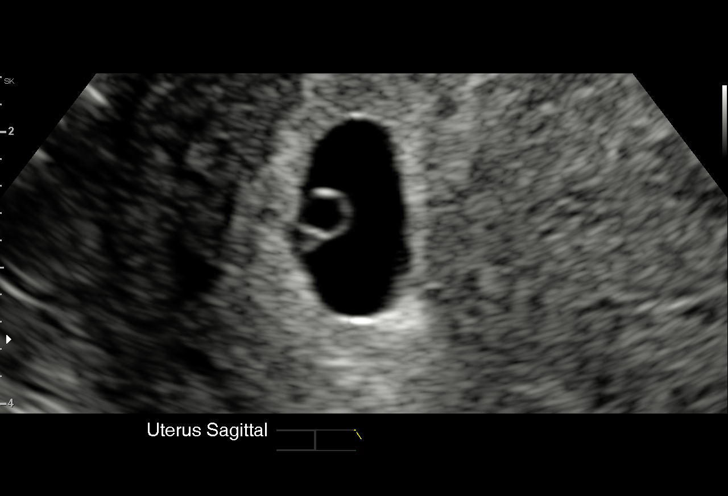
[im 26/41]
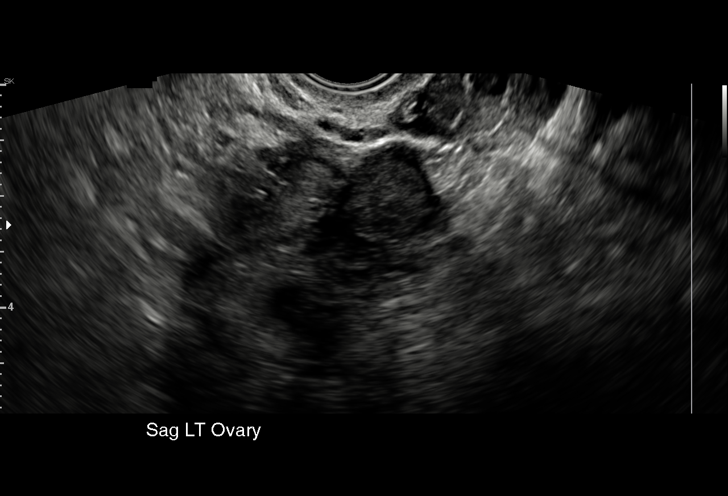
[im 29/41]
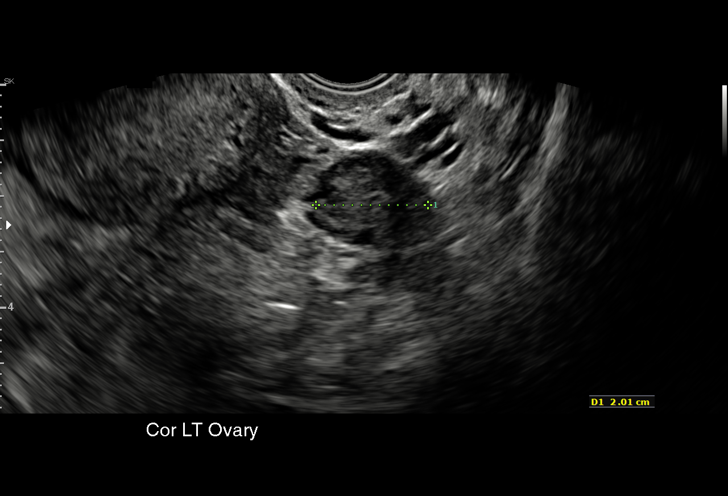
[im 32/41]
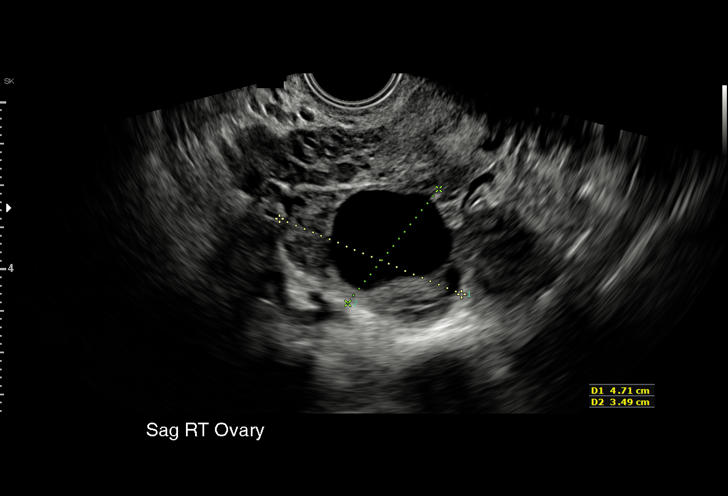
[im 35/41]
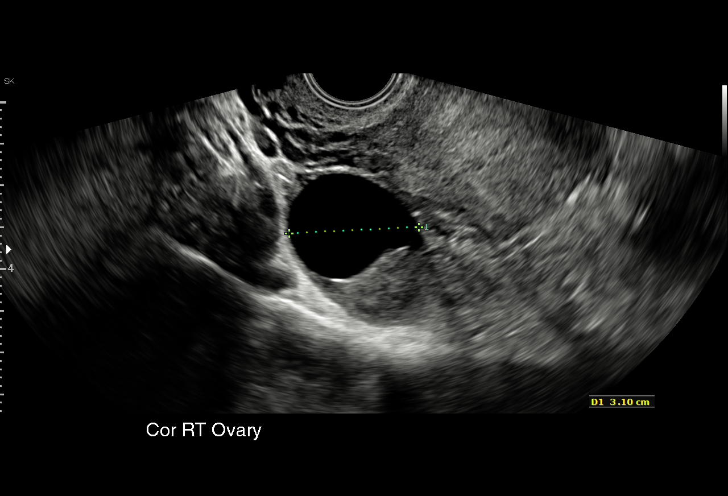
[im 38/41]
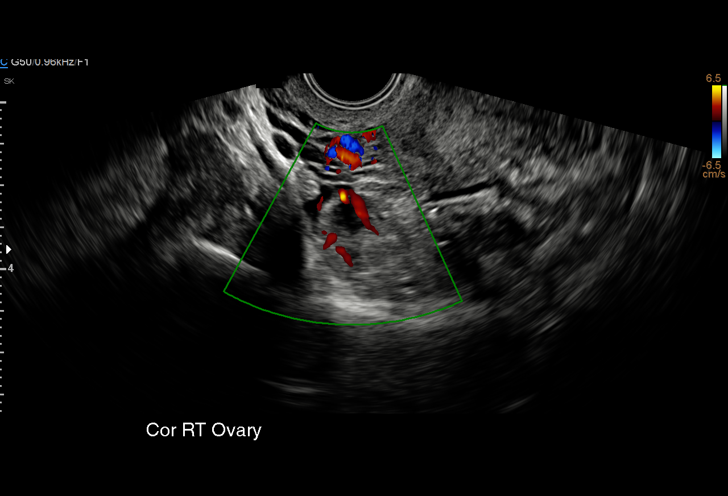
[im 41/41]
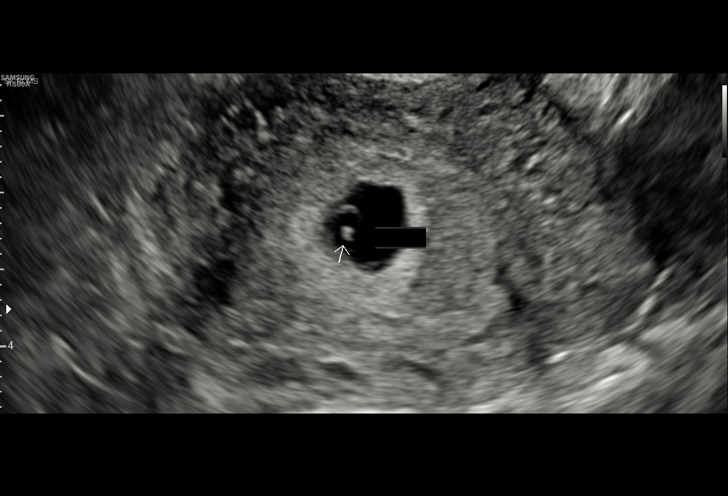

[15 of 28 positions shown; findings below may reference images not displayed]

FINDINGS: Intrauterine gestational sac: Present

Yolk sac:  Present

Embryo:  Present

Cardiac Activity: Present

Heart Rate: 103 bpm

CRL:   2  mm   6 w 0 d                  US EDC: 01/24/2017

Subchorionic hemorrhage:  None visualized.

Maternal uterus/adnexae: Simple cyst of the right ovary measured
by 3.0 by 3.1 cm. Trace amount of free pelvic fluid.
IMPRESSION: 1. Single living intrauterine pregnancy without observed
complicating feature. Estimated gestational age 6 weeks 0 days.

## 2017-07-30 ENCOUNTER — Ambulatory Visit (INDEPENDENT_AMBULATORY_CARE_PROVIDER_SITE_OTHER): Payer: Medicaid Other

## 2017-07-30 DIAGNOSIS — Z3042 Encounter for surveillance of injectable contraceptive: Secondary | ICD-10-CM

## 2017-07-30 MED ORDER — MEDROXYPROGESTERONE ACETATE 150 MG/ML IM SUSP
150.0000 mg | Freq: Once | INTRAMUSCULAR | Status: AC
  Administered 2017-07-30: 150 mg via INTRAMUSCULAR

## 2017-07-30 NOTE — Progress Notes (Signed)
Pt here for a depo. Given in right upper quad. Pt tolerated well. Next depo due between 1/21 through 2/4

## 2017-07-30 NOTE — Progress Notes (Signed)
Agree with nursing staff's documentation of this patient's clinic encounter.  Philippe Gang, MD    

## 2017-08-10 ENCOUNTER — Other Ambulatory Visit: Payer: Self-pay

## 2017-08-10 ENCOUNTER — Encounter: Payer: Self-pay | Admitting: Obstetrics

## 2017-08-10 ENCOUNTER — Other Ambulatory Visit (HOSPITAL_COMMUNITY)
Admission: RE | Admit: 2017-08-10 | Discharge: 2017-08-10 | Disposition: A | Discharge status: Home / Self Care | Payer: Medicaid Other | Source: Ambulatory Visit | Attending: Obstetrics | Admitting: Obstetrics

## 2017-08-10 ENCOUNTER — Ambulatory Visit (INDEPENDENT_AMBULATORY_CARE_PROVIDER_SITE_OTHER): Payer: Medicaid Other | Admitting: Obstetrics

## 2017-08-10 VITALS — BP 141/84 | HR 81 | Ht 66.0 in | Wt 218.2 lb

## 2017-08-10 DIAGNOSIS — Z3042 Encounter for surveillance of injectable contraceptive: Secondary | ICD-10-CM

## 2017-08-10 DIAGNOSIS — Z113 Encounter for screening for infections with a predominantly sexual mode of transmission: Secondary | ICD-10-CM

## 2017-08-10 DIAGNOSIS — Z0000 Encounter for general adult medical examination without abnormal findings: Secondary | ICD-10-CM | POA: Diagnosis not present

## 2017-08-10 DIAGNOSIS — Z01419 Encounter for gynecological examination (general) (routine) without abnormal findings: Secondary | ICD-10-CM | POA: Diagnosis not present

## 2017-08-10 DIAGNOSIS — N898 Other specified noninflammatory disorders of vagina: Secondary | ICD-10-CM

## 2017-08-10 NOTE — Progress Notes (Signed)
Subjective:        Erica Mack is a 31 y.o. female here for a routine exam.  Current complaints: None.    Personal health questionnaire:  Is patient Ashkenazi Jewish, have a family history of breast and/or ovarian cancer: no Is there a family history of uterine cancer diagnosed at age < 3550, gastrointestinal cancer, urinary tract cancer, family member who is a Personnel officerLynch syndrome-associated carrier: no Is the patient overweight and hypertensive, family history of diabetes, personal history of gestational diabetes, preeclampsia or PCOS: no Is patient over 6555, have PCOS,  family history of premature CHD under age 31, diabetes, smoke, have hypertension or peripheral artery disease:  no At any time, has a partner hit, kicked or otherwise hurt or frightened you?: no Over the past 2 weeks, have you felt down, depressed or hopeless?: no Over the past 2 weeks, have you felt little interest or pleasure in doing things?:no   Gynecologic History No LMP recorded. Patient has had an injection. Contraception: Depo-Provera injections Last Pap: 2014. Results were: normal Last mammogram: n/a. Results were: n/a  Obstetric History OB History  Gravida Para Term Preterm AB Living  5 3 3   2 3   SAB TAB Ectopic Multiple Live Births    2   0 3    # Outcome Date GA Lbr Len/2nd Weight Sex Delivery Anes PTL Lv  5 Term 01/20/17 507w3d 07:02 / 00:10 6 lb 3.3 oz (2.815 kg) M Vag-Spont EPI  LIV  4 Term 09/05/09 3681w0d  6 lb 4 oz (2.835 kg) F Vag-Spont EPI  LIV     Birth Comments: hyperemesis   3 Term 10/25/07 5981w0d  4 lb 11 oz (2.126 kg) M Vag-Spont EPI  LIV     Birth Comments: No complications  2 TAB           1 TAB               Past Medical History:  Diagnosis Date  . Asthma   . Chlamydia contact, treated   . Gonorrhea   . Headache(784.0)   . Hypertension    no meds; dx 2008    Past Surgical History:  Procedure Laterality Date  . DILATION AND CURETTAGE OF UTERUS    . INDUCED ABORTION        Current Outpatient Medications:  .  albuterol (PROVENTIL HFA;VENTOLIN HFA) 108 (90 Base) MCG/ACT inhaler, Inhale 1-2 puffs into the lungs every 6 (six) hours as needed for wheezing or shortness of breath., Disp: 1 Inhaler, Rfl: 0 .  cyclobenzaprine (FLEXERIL) 5 MG tablet, Take 1 tablet (5 mg total) by mouth 3 (three) times daily as needed for muscle spasms., Disp: 20 tablet, Rfl: 0 .  diphenhydramine-acetaminophen (TYLENOL PM) 25-500 MG TABS tablet, Take 2 tablets by mouth at bedtime as needed (for sleep). , Disp: , Rfl:  .  ibuprofen (ADVIL,MOTRIN) 600 MG tablet, Take 1 tablet (600 mg total) by mouth every 6 (six) hours., Disp: 120 tablet, Rfl: 2 .  medroxyPROGESTERone (DEPO-PROVERA) 150 MG/ML injection, Inject 1 mL (150 mg total) into the muscle every 3 (three) months. Bring to office for administration., Disp: 1 mL, Rfl: 4 .  Prenatal Vit-Fe Fumarate-FA (PRENATAL MULTIVITAMIN) TABS tablet, Take 1 tablet by mouth daily., Disp: , Rfl:  .  triamterene-hydrochlorothiazide (DYAZIDE) 37.5-25 MG capsule, Take 1 each (1 capsule total) by mouth daily., Disp: 30 capsule, Rfl: 11 .  norethindrone-ethinyl estradiol 1/35 (ORTHO-NOVUM 1/35, 28,) tablet, Take 1 tablet by mouth  daily. (Patient not taking: Reported on 05/10/2017), Disp: 1 Package, Rfl: 1 .  oxyCODONE (OXY IR/ROXICODONE) 5 MG immediate release tablet, Take 1 tablet (5 mg total) by mouth every 4 (four) hours as needed (pain scale 4-7). (Patient not taking: Reported on 03/02/2017), Disp: 30 tablet, Rfl: 0 .  senna-docusate (SENOKOT-S) 8.6-50 MG tablet, Take 2 tablets by mouth at bedtime. (Patient not taking: Reported on 03/02/2017), Disp: 60 tablet, Rfl: 2  Current Facility-Administered Medications:  .  medroxyPROGESTERone (DEPO-PROVERA) injection 150 mg, 150 mg, Intramuscular, Q90 days, Brock Bad, MD, 150 mg at 05/10/17 1034 Allergies  Allergen Reactions  . Aspirin Itching    Social History   Tobacco Use  . Smoking status: Never Smoker   . Smokeless tobacco: Never Used  Substance Use Topics  . Alcohol use: Yes    Alcohol/week: 3.3 oz    Types: 3 Shots of liquor, 3 Standard drinks or equivalent per week    Comment: occasional alcohol    Family History  Problem Relation Age of Onset  . Hypertension Mother   . Diabetes Brother   . Hypertension Maternal Aunt   . Heart disease Maternal Aunt   . Cancer Maternal Grandmother   . Hypertension Maternal Grandmother   . Anesthesia problems Neg Hx       Review of Systems  Constitutional: negative for fatigue and weight loss Respiratory: negative for cough and wheezing Cardiovascular: negative for chest pain, fatigue and palpitations Gastrointestinal: negative for abdominal pain and change in bowel habits Musculoskeletal:negative for myalgias Neurological: negative for gait problems and tremors Behavioral/Psych: negative for abusive relationship, depression Endocrine: negative for temperature intolerance    Genitourinary:negative for abnormal menstrual periods, genital lesions, hot flashes, sexual problems and vaginal discharge Integument/breast: negative for breast lump, breast tenderness, nipple discharge and skin lesion(s)    Objective:       BP (!) 141/84   Pulse 81   Ht 5\' 6"  (1.676 m)   Wt 218 lb 3.2 oz (99 kg)   BMI 35.22 kg/m  General:   alert  Skin:   no rash or abnormalities  Lungs:   clear to auscultation bilaterally  Heart:   regular rate and rhythm, S1, S2 normal, no murmur, click, rub or gallop  Breasts:   normal without suspicious masses, skin or nipple changes or axillary nodes  Abdomen:  normal findings: no organomegaly, soft, non-tender and no hernia  Pelvis:  External genitalia: normal general appearance Urinary system: urethral meatus normal and bladder without fullness, nontender Vaginal: normal without tenderness, induration or masses Cervix: normal appearance Adnexa: normal bimanual exam Uterus: anteverted and non-tender, normal size    Lab Review Urine pregnancy test Labs reviewed yes Radiologic studies reviewed no  50% of 20 min visit spent on counseling and coordination of care.    Assessment:   1. Encounter for routine gynecological examination with Papanicolaou smear of cervix Rx: - Cytology - PAP  2. Encounter for surveillance of injectable contraceptive - pleased with Depo   3. Screen for STD (sexually transmitted disease) Rx: - Hepatitis B surface antigen - Hepatitis C antibody - HIV antibody - RPR  4. Vaginal discharge Rx: - Cervicovaginal ancillary only   Plan:    Education reviewed: calcium supplements, depression evaluation, low fat, low cholesterol diet, safe sex/STD prevention, self breast exams and weight bearing exercise. Contraception: Depo-Provera injections. Follow up in: 1 year.   No orders of the defined types were placed in this encounter.  Orders Placed This Encounter  Procedures  . Hepatitis B surface antigen  . Hepatitis C antibody  . HIV antibody  . RPR

## 2017-08-11 LAB — HIV ANTIBODY (ROUTINE TESTING W REFLEX): HIV SCREEN 4TH GENERATION: NONREACTIVE

## 2017-08-11 LAB — RPR: RPR: NONREACTIVE

## 2017-08-11 LAB — HEPATITIS C ANTIBODY: Hep C Virus Ab: <0.1 s/co ratio (ref 0.0–0.9)

## 2017-08-11 LAB — HEPATITIS B SURFACE ANTIGEN: Hepatitis B Surface Ag: NEGATIVE

## 2017-08-13 DIAGNOSIS — H40033 Anatomical narrow angle, bilateral: Secondary | ICD-10-CM | POA: Diagnosis not present

## 2017-08-13 DIAGNOSIS — H16223 Keratoconjunctivitis sicca, not specified as Sjogren's, bilateral: Secondary | ICD-10-CM | POA: Diagnosis not present

## 2017-08-13 LAB — CERVICOVAGINAL ANCILLARY ONLY
Bacterial vaginitis: NEGATIVE
CHLAMYDIA, DNA PROBE: NEGATIVE
Candida vaginitis: NEGATIVE
NEISSERIA GONORRHEA: NEGATIVE
TRICH (WINDOWPATH): NEGATIVE

## 2017-08-14 LAB — CYTOLOGY - PAP
DIAGNOSIS: NEGATIVE
HPV (WINDOPATH): NOT DETECTED

## 2017-10-22 ENCOUNTER — Ambulatory Visit: Payer: Medicaid Other

## 2017-10-25 IMAGING — CR DG CHEST 2V
2 series · 2 of 2 positions shown · non-contrast
Comparison: 07/13/1949 chest radiograph.

CLINICAL DATA: Cough

EXAM:
CHEST  2 VIEW

[chest pa]
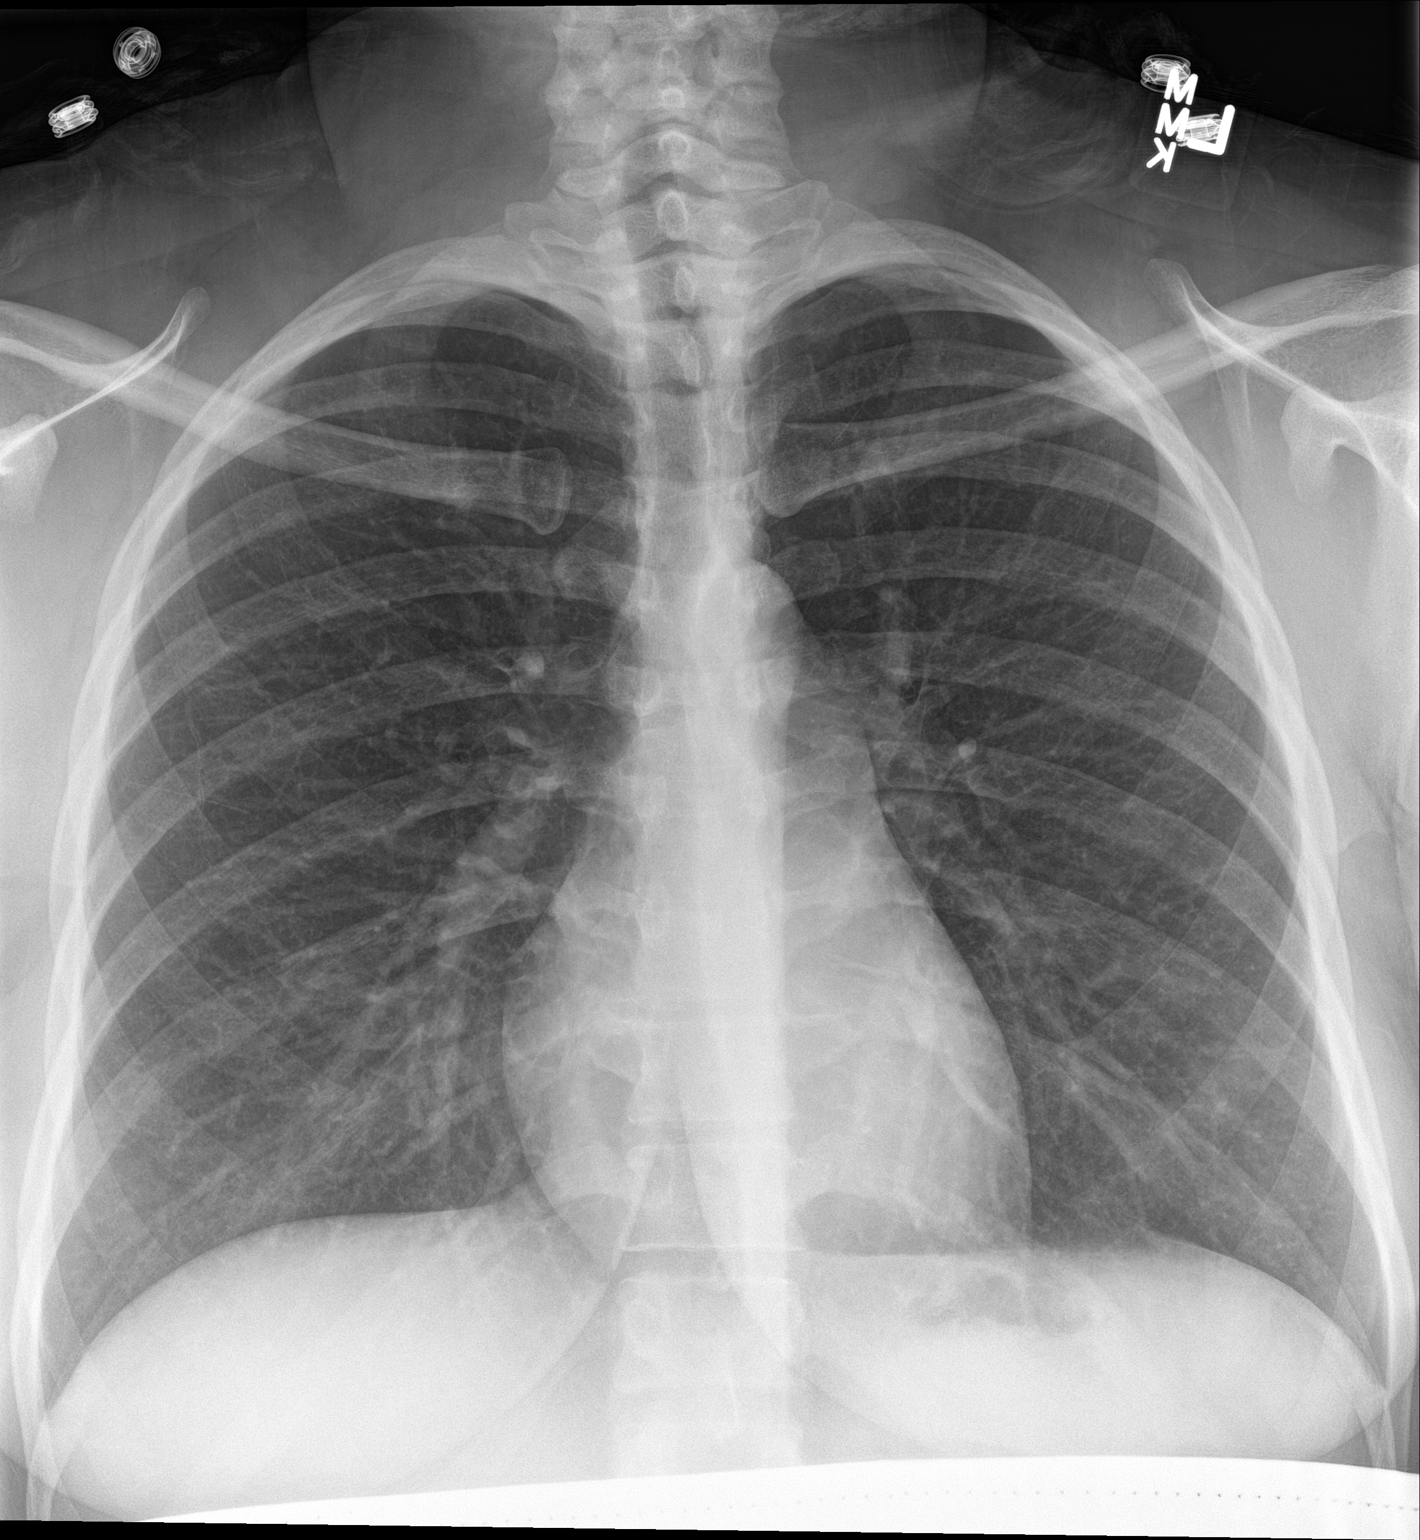

[chest lat]
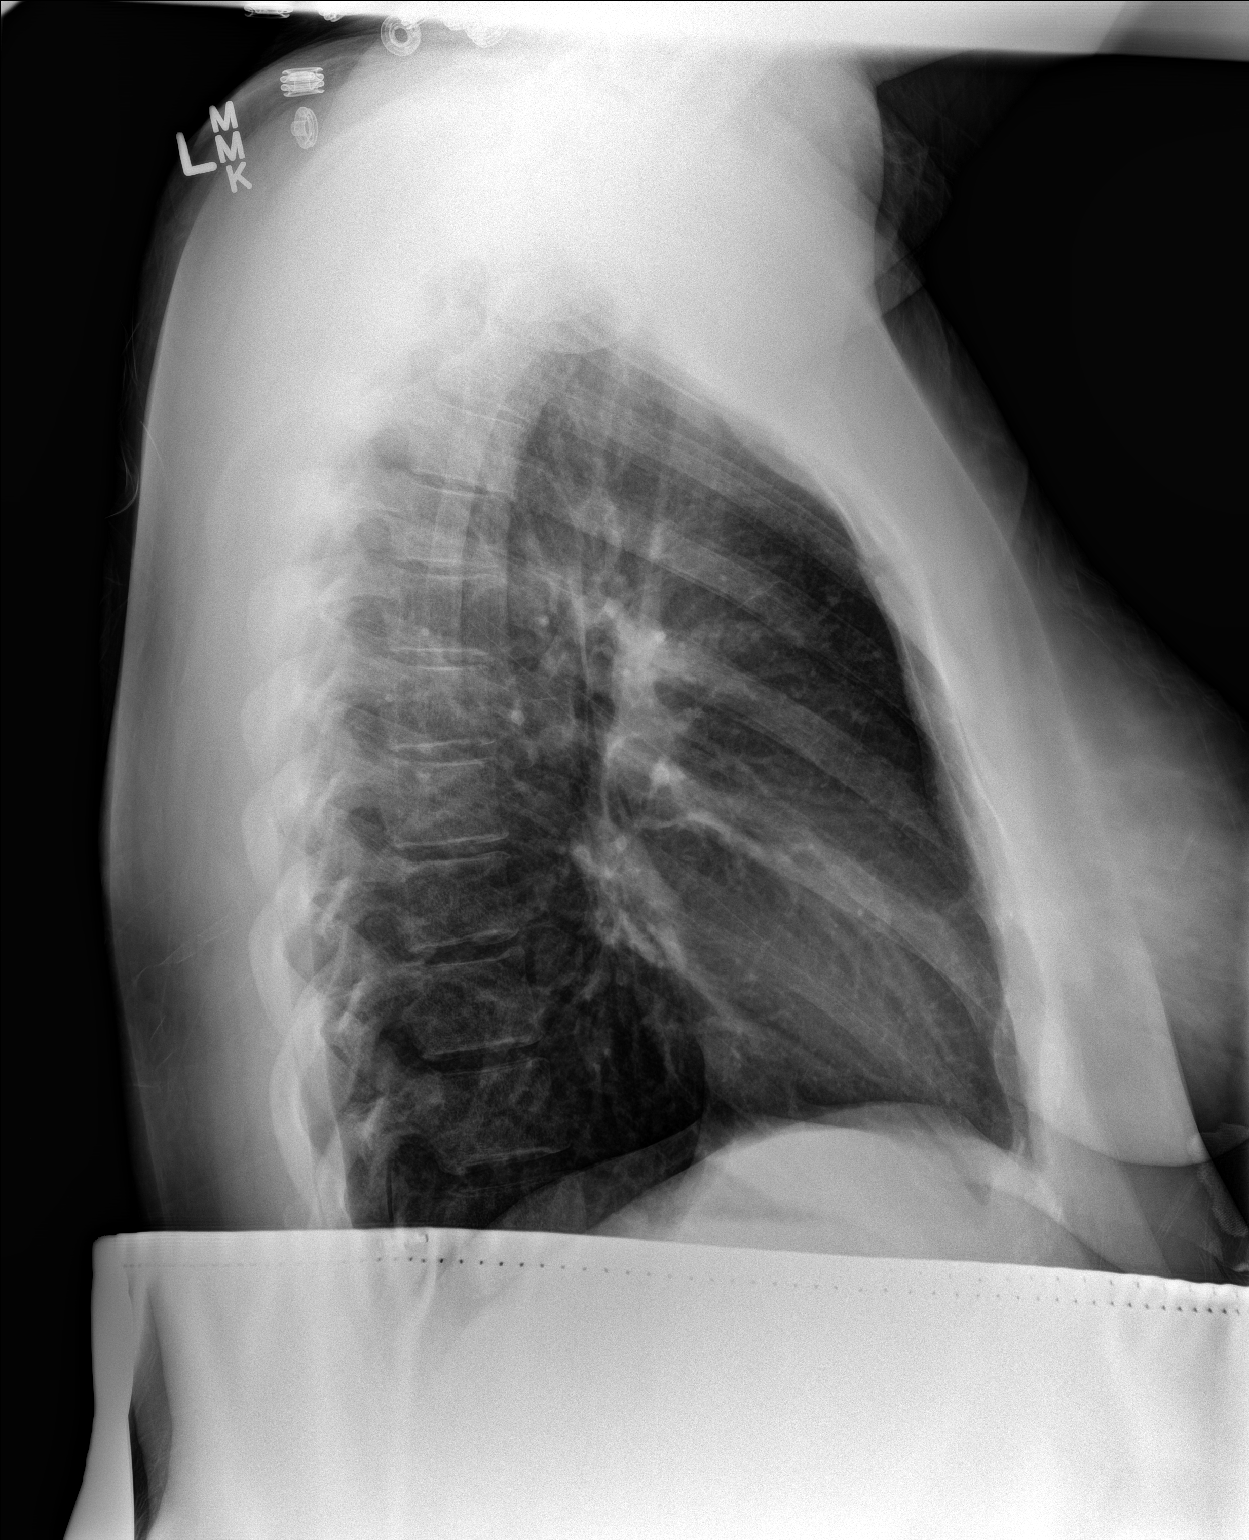

[2 of 2 positions shown; findings below may reference images not displayed]

FINDINGS: Stable cardiomediastinal silhouette with normal heart size. No
pneumothorax. No pleural effusion. Lungs appear clear, with no acute
consolidative airspace disease and no pulmonary edema.
IMPRESSION: No active cardiopulmonary disease.

## 2017-10-26 ENCOUNTER — Telehealth: Payer: Self-pay | Admitting: *Deleted

## 2017-10-26 ENCOUNTER — Ambulatory Visit: Payer: Medicaid Other

## 2017-10-26 NOTE — Telephone Encounter (Signed)
Spoke w/ patient after a previous No Show for her DEPO shot pt stated her baby was on the hospital ans she would try to see if she could come in if baby was released from hospital. Child has been hospitalized since Dec of 2018.

## 2017-11-11 IMAGING — US US MFM OB DETAIL+14 WK
1 series · 14 of 28 positions shown · non-contrast
Comparison: none

[Series 1: us mfm ob detail+14 wk · 74 acquisitions, 14 frames shown]
[im 3/74]
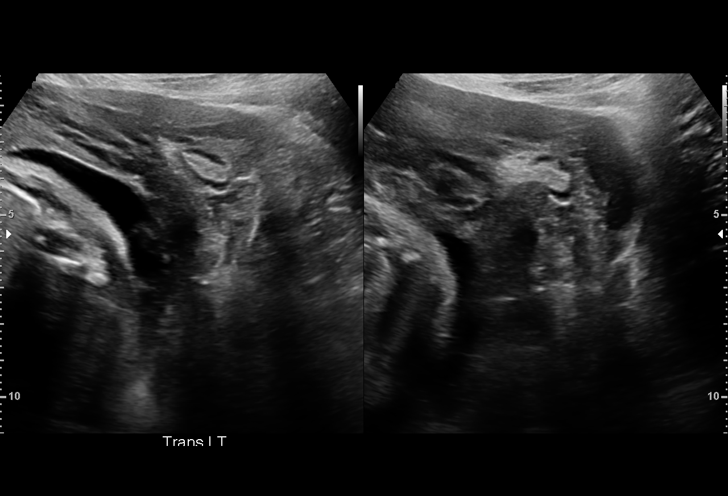
[im 9/74]
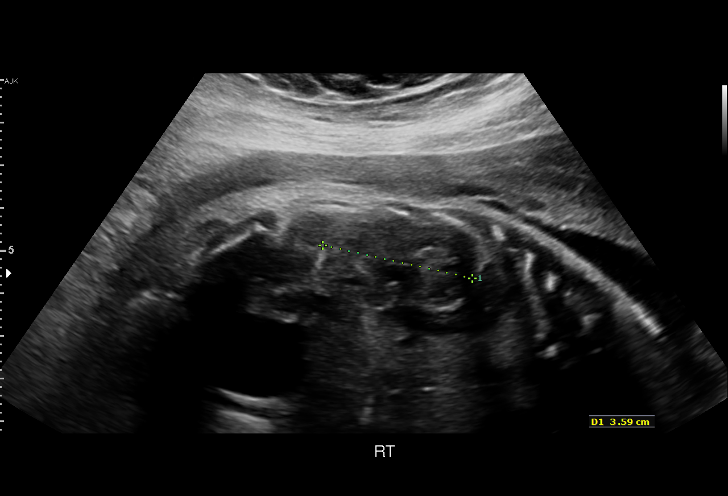
[im 14/74]
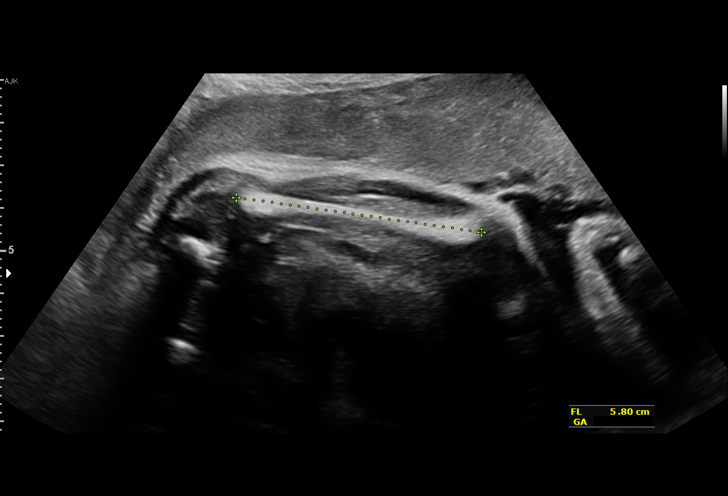
[im 19/74]
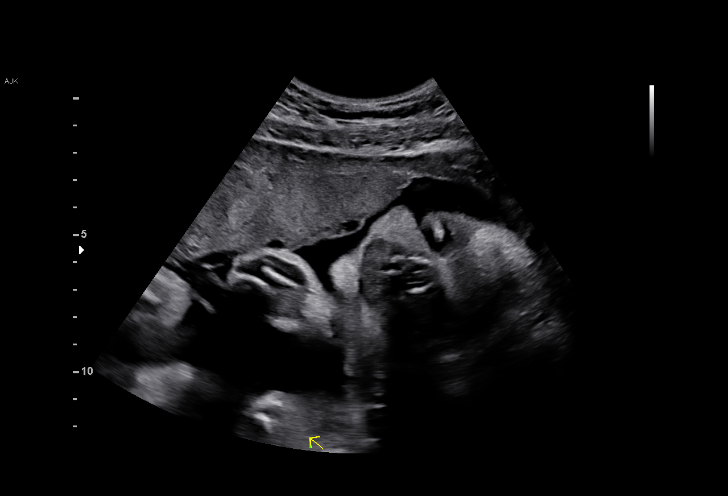
[im 25/74]
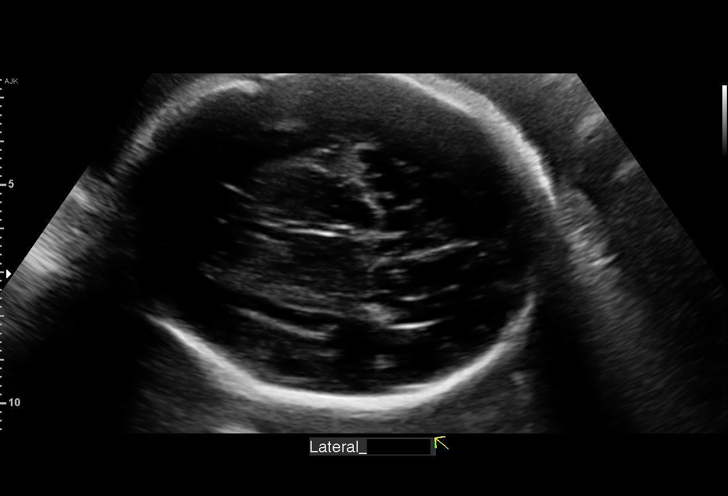
[im 30/74]
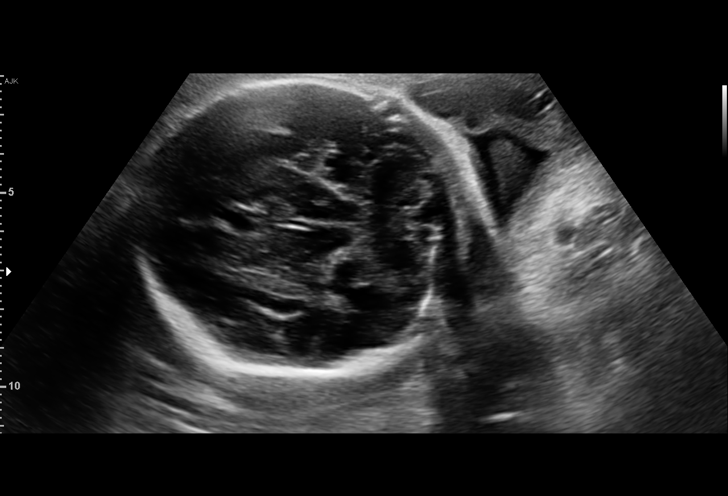
[im 36/74]
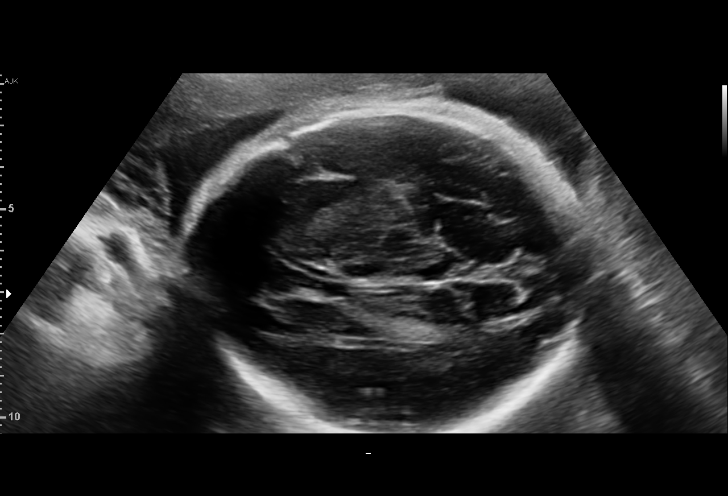
[im 41/74]
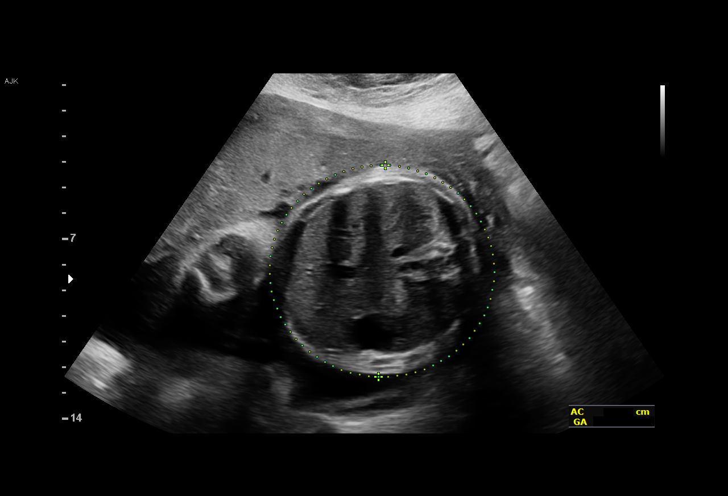
[im 46/74]
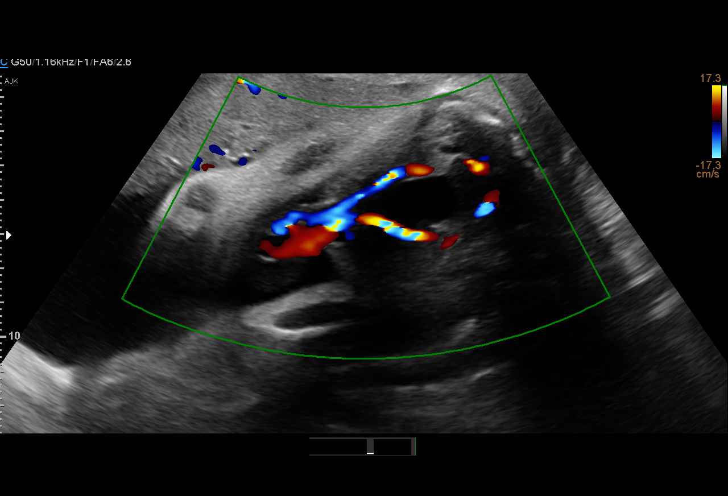
[im 52/74]
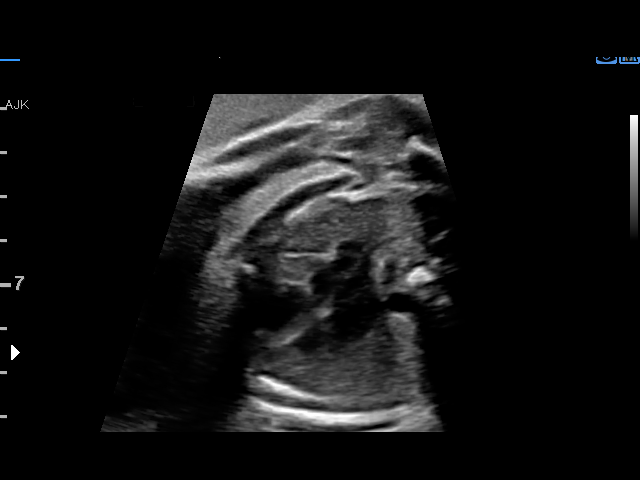
[im 57/74]
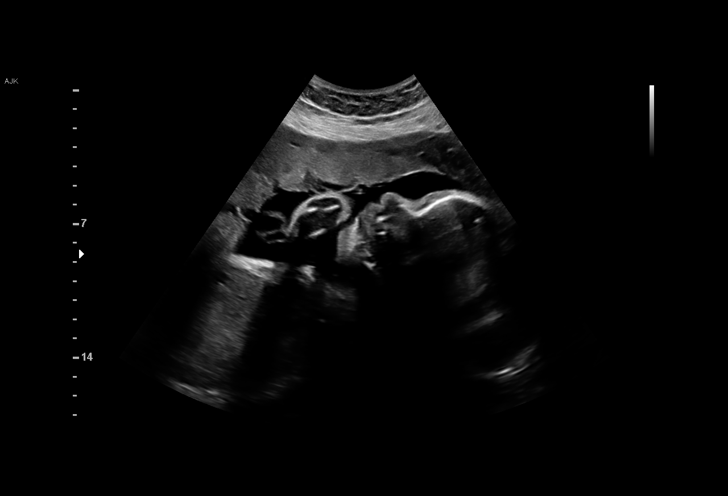
[im 63/74]
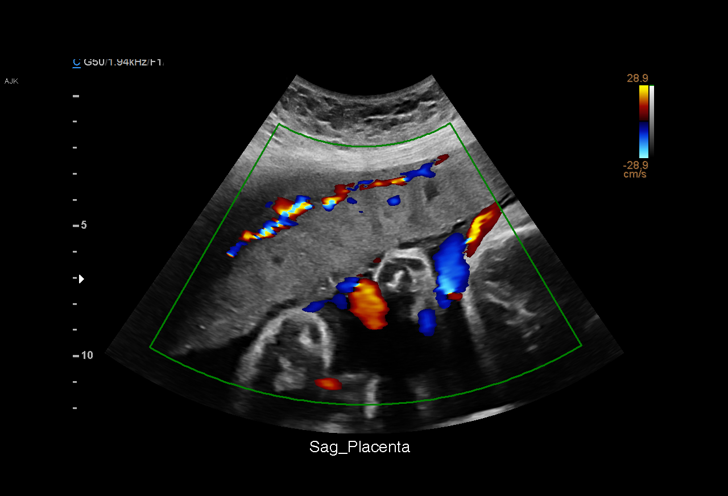
[im 68/74]
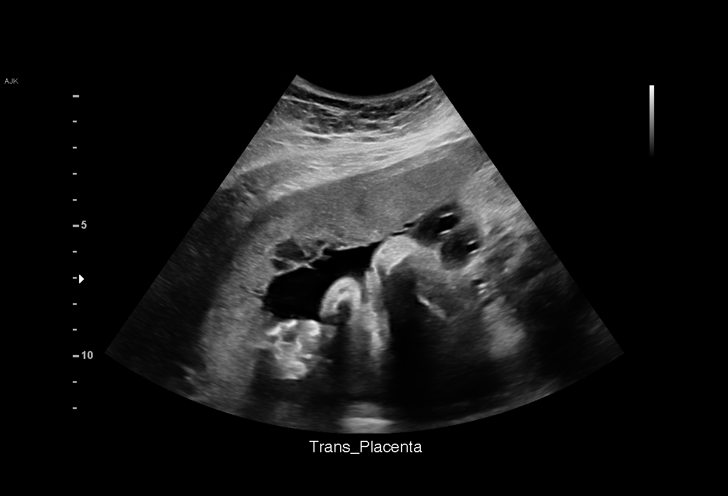
[im 74/74]
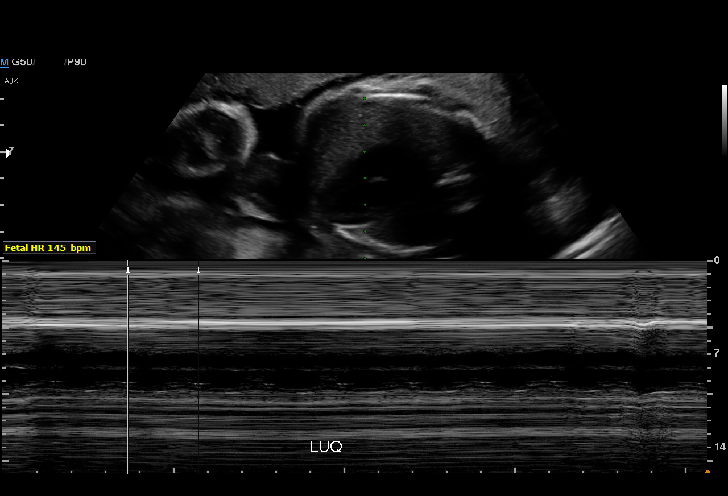

[14 of 28 positions shown; findings below may reference images not displayed]

([HOSPITAL])
[REDACTED]

1  PENJAMI MAJA           [id number]      [id number]     [id number]
Indications

31 weeks gestation of pregnancy
Obesity complicating pregnancy, third
trimester
Encounter for fetal anatomic survey
OB History

Blood Type:            Height:  5'6"   Weight (lb):  210      BMI:
Gravidity:    5         Term:   2        Prem:   0        SAB:   0
TOP:          2       Ectopic:  0        Living: 2
Fetal Evaluation

Num Of Fetuses:     1
Fetal Heart         145
Rate(bpm):
Cardiac Activity:   Observed
Presentation:       Cephalic
Placenta:           Anterior, above cervical os
P. Cord Insertion:  Visualized, central
Amniotic Fluid
AFI FV:      Subjectively within normal limits

AFI Sum(cm)     %Tile       Largest Pocket(cm)
13.53           43

RUQ(cm)                     LUQ(cm)        LLQ(cm)
6.43
Biometry

BPD:        76  mm     G. Age:  30w 4d         18  %    CI:        71.93   %   70 - 86
FL/HC:      20.2   %   19.3 -
HC:      285.2  mm     G. Age:  31w 2d         17  %    HC/AC:      1.09       0.96 -
AC:      262.6  mm     G. Age:  30w 3d         22  %    FL/BPD:     75.9   %   71 - 87
FL:       57.7  mm     G. Age:  30w 1d         13  %    FL/AC:      22.0   %   20 - 24
CER:      40.2  mm     G. Age:  34w 1d         91  %

Est. FW:    0136  gm      3 lb 7 oz     38  %
Gestational Age

LMP:           30w 3d       Date:   04/25/16                 EDD:   01/30/17
U/S Today:     30w 4d                                        EDD:   01/29/17
Best:          31w 2d    Det. By:   Early Ultrasound         EDD:   01/24/17
(05/31/16)
Anatomy

Cranium:               Appears normal         Aortic Arch:            Not well visualized
Cavum:                 Appears normal         Ductal Arch:            Not well visualized
Ventricles:            Appears normal         Diaphragm:              Appears normal
Choroid Plexus:        Appears normal         Stomach:                Appears normal, left
sided
Cerebellum:            Appears normal         Abdomen:                Appears normal
Posterior Fossa:       Appears normal         Abdominal Wall:         Appears nml (cord
insert, abd wall)
Nuchal Fold:           Not applicable (>20    Cord Vessels:           Appears normal (3
wks GA)                                        vessel cord)
Face:                  Appears normal         Kidneys:                Appear normal
(orbits and profile)
Lips:                  Appears normal         Bladder:                Appears normal
Thoracic:              Appears normal         Spine:                  Not well visualized
Heart:                 Appears normal         Upper Extremities:      Visualized
(4CH, axis, and situs
RVOT:                  Appears normal         Lower Extremities:      Visualized
LVOT:                  Appears normal

Other:  Fetus appears to be a male. Open hands visualized. Nasal bone
visualized.Technically difficult due to fetal position.
Cervix Uterus Adnexa

Cervix
Not visualized (advanced GA >76wks)

Uterus
No abnormality visualized.
Left Ovary
Not visualized.

Right Ovary
Within normal limits.

Adnexa:       No abnormality visualized.
Impression

SIUP at 31+2 weeks
Normal detailed fetal anatomy; limited views of arches and
spine
Normal amniotic fluid volume
Measurements consistent with early US; EFW at the 38th
%tile; AC at the 22nd %tile
Recommendations

Follow-up as clinically indicated

## 2018-01-15 ENCOUNTER — Ambulatory Visit (INDEPENDENT_AMBULATORY_CARE_PROVIDER_SITE_OTHER): Payer: Medicaid Other

## 2018-01-15 DIAGNOSIS — Z3202 Encounter for pregnancy test, result negative: Secondary | ICD-10-CM

## 2018-01-15 DIAGNOSIS — Z3042 Encounter for surveillance of injectable contraceptive: Secondary | ICD-10-CM

## 2018-01-15 LAB — POCT URINE PREGNANCY: Preg Test, Ur: NEGATIVE

## 2018-01-15 NOTE — Progress Notes (Signed)
I have reviewed the chart and agree with nursing staff's documentation of this patient's encounter.  Erica AntiguaPeggy Raymond Azure, MD 01/15/2018 2:21 PM

## 2018-01-15 NOTE — Progress Notes (Signed)
Nurse visit for Depo restart. UPT neg today. Pt will return to office in 2 wks for another UPT and Depo injection. [facility]

## 2018-01-29 ENCOUNTER — Ambulatory Visit (INDEPENDENT_AMBULATORY_CARE_PROVIDER_SITE_OTHER): Payer: Medicaid Other

## 2018-01-29 DIAGNOSIS — Z3202 Encounter for pregnancy test, result negative: Secondary | ICD-10-CM

## 2018-01-29 DIAGNOSIS — Z3042 Encounter for surveillance of injectable contraceptive: Secondary | ICD-10-CM | POA: Diagnosis not present

## 2018-01-29 LAB — POCT URINE PREGNANCY: Preg Test, Ur: NEGATIVE

## 2018-01-29 MED ORDER — MEDROXYPROGESTERONE ACETATE 150 MG/ML IM SUSP
150.0000 mg | Freq: Once | INTRAMUSCULAR | Status: AC
  Administered 2018-01-29: 150 mg via INTRAMUSCULAR

## 2018-01-29 MED ORDER — FLUCONAZOLE 150 MG PO TABS
150.0000 mg | ORAL_TABLET | Freq: Once | ORAL | 1 refills | Status: AC
Start: 2018-01-29 — End: 2018-01-29

## 2018-01-29 NOTE — Addendum Note (Signed)
Addended by: Dalphine Handing on: 01/29/2018 01:18 PM   Modules accepted: Orders

## 2018-01-29 NOTE — Progress Notes (Signed)
Nurse visit for Depo restart. UPT was neg two weeks ago. UPT neg today. Pt supplied Depo given R upper outer quad w/o complaints. Next Depo due July 23-Aug 6, pt agrees.

## 2018-01-30 NOTE — Progress Notes (Signed)
I have reviewed the chart and agree with nursing staff's documentation of this patient's encounter.  Kema Santaella, MD 01/30/2018 2:38 PM    

## 2018-03-07 ENCOUNTER — Ambulatory Visit: Payer: Medicaid Other | Admitting: Obstetrics and Gynecology

## 2018-04-04 ENCOUNTER — Telehealth: Payer: Self-pay | Admitting: *Deleted

## 2018-04-04 NOTE — Telephone Encounter (Signed)
Patient called in inquiring about Family Planning Medicaid and I called patient back to inform her Medicaid was active and her appt for depo was already scheduled and no answer and no answer unable to leave.Marland Kitchen..Marland Kitchen

## 2018-04-19 ENCOUNTER — Encounter (HOSPITAL_COMMUNITY): Payer: Self-pay | Admitting: *Deleted

## 2018-04-19 ENCOUNTER — Inpatient Hospital Stay (HOSPITAL_COMMUNITY)
Admission: AD | Admit: 2018-04-19 | Discharge: 2018-04-19 | Disposition: A | Discharge status: Home / Self Care | Payer: Medicaid Other | Source: Ambulatory Visit | Attending: Obstetrics and Gynecology | Admitting: Obstetrics and Gynecology

## 2018-04-19 DIAGNOSIS — B9689 Other specified bacterial agents as the cause of diseases classified elsewhere: Secondary | ICD-10-CM | POA: Insufficient documentation

## 2018-04-19 DIAGNOSIS — I10 Essential (primary) hypertension: Secondary | ICD-10-CM | POA: Insufficient documentation

## 2018-04-19 DIAGNOSIS — N939 Abnormal uterine and vaginal bleeding, unspecified: Secondary | ICD-10-CM | POA: Insufficient documentation

## 2018-04-19 DIAGNOSIS — R5383 Other fatigue: Secondary | ICD-10-CM | POA: Insufficient documentation

## 2018-04-19 DIAGNOSIS — N760 Acute vaginitis: Secondary | ICD-10-CM | POA: Diagnosis not present

## 2018-04-19 DIAGNOSIS — Z79899 Other long term (current) drug therapy: Secondary | ICD-10-CM | POA: Insufficient documentation

## 2018-04-19 DIAGNOSIS — N921 Excessive and frequent menstruation with irregular cycle: Secondary | ICD-10-CM

## 2018-04-19 DIAGNOSIS — J45909 Unspecified asthma, uncomplicated: Secondary | ICD-10-CM | POA: Insufficient documentation

## 2018-04-19 DIAGNOSIS — Z886 Allergy status to analgesic agent status: Secondary | ICD-10-CM | POA: Insufficient documentation

## 2018-04-19 LAB — URINALYSIS, ROUTINE W REFLEX MICROSCOPIC
BACTERIA UA: NONE SEEN
Bilirubin Urine: NEGATIVE
Glucose, UA: NEGATIVE mg/dL
KETONES UR: NEGATIVE mg/dL
Nitrite: NEGATIVE
Protein, ur: NEGATIVE mg/dL
Specific Gravity, Urine: 1.023 (ref 1.005–1.030)
pH: 6 (ref 5.0–8.0)

## 2018-04-19 LAB — CBC
HCT: 37.5 % (ref 36.0–46.0)
HEMOGLOBIN: 12.6 g/dL (ref 12.0–15.0)
MCH: 28.6 pg (ref 26.0–34.0)
MCHC: 33.6 g/dL (ref 30.0–36.0)
MCV: 85.2 fL (ref 78.0–100.0)
PLATELETS: 281 10*3/uL (ref 150–400)
RBC: 4.4 MIL/uL (ref 3.87–5.11)
RDW: 14 % (ref 11.5–15.5)
WBC: 8.5 10*3/uL (ref 4.0–10.5)

## 2018-04-19 LAB — WET PREP, GENITAL
Clue Cells Wet Prep HPF POC: NONE SEEN
SPERM: NONE SEEN
TRICH WET PREP: NONE SEEN
YEAST WET PREP: NONE SEEN

## 2018-04-19 LAB — POCT PREGNANCY, URINE: PREG TEST UR: NEGATIVE

## 2018-04-19 MED ORDER — FLUCONAZOLE 150 MG PO TABS: 150.0000 mg | ORAL_TABLET | Freq: Once | ORAL | 0 refills | Status: DC

## 2018-04-19 MED ORDER — METRONIDAZOLE 500 MG PO TABS: 500.0000 mg | ORAL_TABLET | Freq: Two times a day (BID) | ORAL | 0 refills | Status: AC

## 2018-04-19 NOTE — MAU Note (Signed)
Pt reports vaginal bleeding for 3 weeks, stopped one week ago. Due for depo in one week. Reports vaginal discharge since bleeding stopped. Feels "weak"

## 2018-04-19 NOTE — MAU Provider Note (Signed)
Chief Complaint: Vaginal Bleeding; Vaginal Discharge; and Fatigue   First Provider Initiated Contact with Patient 04/19/18 1028      SUBJECTIVE HPI: Erica Mack is a 32 y.o. [phone] nonpregnant pt of [facility] who presents to maternity admissions reporting menstrual bleeding that was heavy with clots x 3 weeks that resolved ~ 1 week ago.This was unusual, because with her Depo Provera she usually does not have any periods.  Last Depo was 02/02/18.  She has been on Depo since her baby was born 12/2016. The bleeding stopped but she is feeling fatigued and weak daily for about 2 weeks. She also reports a thin white discharge with some odor since bleeding stopped. She reports she was busy with her son who had a burn and was at [facility] for a while but is now home so she is finally taking care of herself. There are no other associated symptoms. She has not tried any treatments.  She denies vaginal bleeding, abdominal cramping, vaginal itching/burning, urinary symptoms, h/a, dizziness, n/v, or fever/chills.     HPI  Past Medical History:  Diagnosis Date  . Asthma   . Chlamydia contact, treated   . Gonorrhea   . Headache(784.0)   . Hypertension    no meds; dx 2008   Past Surgical History:  Procedure Laterality Date  . DILATION AND CURETTAGE OF UTERUS    . INDUCED ABORTION     Social History   Socioeconomic History  . Marital status: Single    Spouse name: Not on file  . Number of children: Not on file  . Years of education: Not on file  . Highest education level: Not on file  Occupational History  . Not on file  Social Needs  . Financial resource strain: Not on file  . Food insecurity:    Worry: Not on file    Inability: Not on file  . Transportation needs:    Medical: Not on file    Non-medical: Not on file  Tobacco Use  . Smoking status: Never Smoker  . Smokeless tobacco: Never Used  Substance and Sexual Activity  . Alcohol use: Yes    Alcohol/week: 3.6  oz    Types: 3 Shots of liquor, 3 Standard drinks or equivalent per week    Comment: occasional alcohol  . Drug use: No    Types: Marijuana    Comment: prior use  . Sexual activity: Yes    Birth control/protection: Injection  Lifestyle  . Physical activity:    Days per week: Not on file    Minutes per session: Not on file  . Stress: Not on file  Relationships  . Social connections:    Talks on phone: Not on file    Gets together: Not on file    Attends religious service: Not on file    Active member of club or organization: Not on file    Attends meetings of clubs or organizations: Not on file    Relationship status: Not on file  . Intimate partner violence:    Fear of current or ex partner: Not on file    Emotionally abused: Not on file    Physically abused: Not on file    Forced sexual activity: Not on file  Other Topics Concern  . Not on file  Social History Narrative  . Not on file   Current Facility-Administered Medications on File Prior to Encounter  Medication Dose Route Frequency Provider Last Rate Last Dose  . medroxyPROGESTERone (  DEPO-PROVERA) injection 150 mg  150 mg Intramuscular Q90 days Brock BadHarper, Charles A, MD   150 mg at 05/10/17 1034   Current Outpatient Medications on File Prior to Encounter  Medication Sig Dispense Refill  . albuterol (PROVENTIL HFA;VENTOLIN HFA) 108 (90 Base) MCG/ACT inhaler Inhale 1-2 puffs into the lungs every 6 (six) hours as needed for wheezing or shortness of breath. 1 Inhaler 0  . ibuprofen (ADVIL,MOTRIN) 600 MG tablet Take 1 tablet (600 mg total) by mouth every 6 (six) hours. 120 tablet 2  . medroxyPROGESTERone (DEPO-PROVERA) 150 MG/ML injection Inject 1 mL (150 mg total) into the muscle every 3 (three) months. Bring to office for administration. 1 mL 4  . triamterene-hydrochlorothiazide (DYAZIDE) 37.5-25 MG capsule Take 1 each (1 capsule total) by mouth daily. 30 capsule 11   Allergies  Allergen Reactions  . Aspirin Itching     ROS:  Review of Systems  Constitutional: Positive for fatigue. Negative for chills and fever.  Respiratory: Negative for shortness of breath.   Cardiovascular: Negative for chest pain.  Gastrointestinal: Negative for abdominal pain.  Genitourinary: Positive for vaginal discharge. Negative for difficulty urinating, dysuria, flank pain, pelvic pain, vaginal bleeding and vaginal pain.  Neurological: Positive for weakness. Negative for dizziness and headaches.  Psychiatric/Behavioral: Negative.      I have reviewed patient's Past Medical Hx, Surgical Hx, Family Hx, Social Hx, medications and allergies.   Physical Exam   Patient Vitals for the past 24 hrs:  BP Temp Temp src Pulse Resp SpO2 Height Weight  04/19/18 1237 135/85 - - 73 - - - -  04/19/18 0927 136/81 98.9 F (37.2 C) Oral 72 16 99 % 5\' 6"  (1.676 m) 222 lb (100.7 kg)   Constitutional: Well-developed, well-nourished female in no acute distress.  Cardiovascular: normal rate Respiratory: normal effort GI: Abd soft, non-tender. Pos BS x 4 MS: Extremities nontender, no edema, normal ROM Neurologic: Alert and oriented x 4.  GU: Neg CVAT.  PELVIC EXAM: Cervix pink, visually closed, without lesion, moderate amount thin white discharge, vaginal walls and external genitalia normal Bimanual exam: Cervix 0/long/high, firm, anterior, neg CMT, uterus nontender, nonenlarged, adnexa without tenderness, enlargement, or mass    LAB RESULTS Results for orders placed or performed during the hospital encounter of 04/19/18 (from the past 24 hour(s))  Urinalysis, Routine w reflex microscopic     Status: Abnormal   Collection Time: 04/19/18  9:47 AM  Result Value Ref Range   Color, Urine YELLOW YELLOW   APPearance CLEAR CLEAR   Specific Gravity, Urine 1.023 1.005 - 1.030   pH 6.0 5.0 - 8.0   Glucose, UA NEGATIVE NEGATIVE mg/dL   Hgb urine dipstick SMALL (A) NEGATIVE   Bilirubin Urine NEGATIVE NEGATIVE   Ketones, ur NEGATIVE NEGATIVE  mg/dL   Protein, ur NEGATIVE NEGATIVE mg/dL   Nitrite NEGATIVE NEGATIVE   Leukocytes, UA TRACE (A) NEGATIVE   RBC / HPF 0-5 0 - 5 RBC/hpf   WBC, UA 0-5 0 - 5 WBC/hpf   Bacteria, UA NONE SEEN NONE SEEN   Squamous Epithelial / LPF 0-5 0 - 5   Mucus PRESENT   Pregnancy, urine POC     Status: None   Collection Time: 04/19/18  9:51 AM  Result Value Ref Range   Preg Test, Ur NEGATIVE NEGATIVE  CBC     Status: None   Collection Time: 04/19/18 10:38 AM  Result Value Ref Range   WBC 8.5 4.0 - 10.5 K/uL   RBC 4.40  3.87 - 5.11 MIL/uL   Hemoglobin 12.6 12.0 - 15.0 g/dL   HCT 16.1 09.6 - 04.5 %   MCV 85.2 78.0 - 100.0 fL   MCH 28.6 26.0 - 34.0 pg   MCHC 33.6 30.0 - 36.0 g/dL   RDW 40.9 81.1 - 91.4 %   Platelets 281 150 - 400 K/uL  Wet prep, genital     Status: Abnormal   Collection Time: 04/19/18 10:44 AM  Result Value Ref Range   Yeast Wet Prep HPF POC NONE SEEN NONE SEEN   Trich, Wet Prep NONE SEEN NONE SEEN   Clue Cells Wet Prep HPF POC NONE SEEN NONE SEEN   WBC, Wet Prep HPF POC MODERATE (A) NONE SEEN   Sperm NONE SEEN        IMAGING No results found.  MAU Management/MDM: Ordered labs and reviewed results.  Pt bleeding resolved and Hgb low normal so recommend PNV or multivitamin daily.  Although wet prep negative, pt symptoms with vaginal discharge with odor before and after menstrual bleeding are c/w BV.  Pt reports she always gets yeast infection if she takes abx so Diflucan Rx sent along with Flagyl 500 mg BID x 7 days.  Bleeding is likely breakthrough bleeding on Depo, since next dose is due in 1 week.  Discussed LARCs as most effective forms of birth control and may improve bleeding pattern.  Discussed benefits/risks of other methods.  Pt interested in another Nexplanon or possibly IUD.  Pt to discuss with Dr Clearance Coots at next Depo visit. Pt discharged with strict bleeding precautions.  ASSESSMENT 1. BV (bacterial vaginosis)   2. Breakthrough bleeding on Depo-Provera      PLAN Discharge home Allergies as of 04/19/2018      Reactions   Aspirin Itching      Medication List    STOP taking these medications   cyclobenzaprine 5 MG tablet Commonly known as:  FLEXERIL   norethindrone-ethinyl estradiol 1/35 tablet Commonly known as:  ORTHO-NOVUM 1/35 (28)   oxyCODONE 5 MG immediate release tablet Commonly known as:  Oxy IR/ROXICODONE   senna-docusate 8.6-50 MG tablet Commonly known as:  Senokot-S     TAKE these medications   albuterol 108 (90 Base) MCG/ACT inhaler Commonly known as:  PROVENTIL HFA;VENTOLIN HFA Inhale 1-2 puffs into the lungs every 6 (six) hours as needed for wheezing or shortness of breath.   fluconazole 150 MG tablet Commonly known as:  DIFLUCAN Take 1 tablet (150 mg total) by mouth once for 1 dose.   ibuprofen 600 MG tablet Commonly known as:  ADVIL,MOTRIN Take 1 tablet (600 mg total) by mouth every 6 (six) hours.   medroxyPROGESTERone 150 MG/ML injection Commonly known as:  DEPO-PROVERA Inject 1 mL (150 mg total) into the muscle every 3 (three) months. Bring to office for administration.   metroNIDAZOLE 500 MG tablet Commonly known as:  FLAGYL Take 1 tablet (500 mg total) by mouth 2 (two) times daily for 7 days.   triamterene-hydrochlorothiazide 37.5-25 MG capsule Commonly known as:  DYAZIDE Take 1 each (1 capsule total) by mouth daily.      Follow-up Information    Brock Bad, MD Follow up.   Specialty:  Obstetrics and Gynecology Contact information: [address] [place] [address] [phone]           Sharen Counter Certified Nurse-Midwife 04/19/2018  8:26 PM

## 2018-04-20 LAB — HIV ANTIBODY (ROUTINE TESTING W REFLEX): HIV Screen 4th Generation wRfx: NONREACTIVE

## 2018-04-20 LAB — RPR: RPR Ser Ql: NONREACTIVE

## 2018-04-22 LAB — GC/CHLAMYDIA PROBE AMP (~~LOC~~) NOT AT ARMC
Chlamydia: NEGATIVE
Neisseria Gonorrhea: NEGATIVE

## 2018-04-24 ENCOUNTER — Other Ambulatory Visit: Payer: Self-pay

## 2018-04-24 ENCOUNTER — Other Ambulatory Visit: Payer: Self-pay | Admitting: Obstetrics

## 2018-04-24 ENCOUNTER — Ambulatory Visit: Payer: Medicaid Other

## 2018-04-24 DIAGNOSIS — Z30013 Encounter for initial prescription of injectable contraceptive: Secondary | ICD-10-CM

## 2018-04-24 MED ORDER — MEDROXYPROGESTERONE ACETATE 150 MG/ML IM SUSP: 150.0000 mg | INTRAMUSCULAR | 0 refills | Status: DC

## 2018-04-25 ENCOUNTER — Ambulatory Visit (INDEPENDENT_AMBULATORY_CARE_PROVIDER_SITE_OTHER): Payer: Medicaid Other

## 2018-04-25 DIAGNOSIS — Z3042 Encounter for surveillance of injectable contraceptive: Secondary | ICD-10-CM | POA: Diagnosis not present

## 2018-04-25 MED ORDER — MEDROXYPROGESTERONE ACETATE 150 MG/ML IM SUSP
150.0000 mg | Freq: Once | INTRAMUSCULAR | Status: AC
  Administered 2018-04-25: 150 mg via INTRAMUSCULAR

## 2018-04-25 NOTE — Progress Notes (Signed)
Pt is here for depo injection only. Pt is on time for depo. Injection given without difficulty, pt advised to make appt for 3 months for next depo injection

## 2018-07-09 ENCOUNTER — Other Ambulatory Visit: Payer: Self-pay

## 2018-07-09 DIAGNOSIS — Z30013 Encounter for initial prescription of injectable contraceptive: Secondary | ICD-10-CM

## 2018-07-09 MED ORDER — MEDROXYPROGESTERONE ACETATE 150 MG/ML IM SUSP: 150.0000 mg | INTRAMUSCULAR | 1 refills | Status: DC

## 2018-07-09 NOTE — Progress Notes (Signed)
Pt has Depo injection appt tomorrow.  AEX due 07/2018.

## 2018-07-10 ENCOUNTER — Ambulatory Visit: Payer: Medicaid Other

## 2018-07-17 ENCOUNTER — Ambulatory Visit (INDEPENDENT_AMBULATORY_CARE_PROVIDER_SITE_OTHER): Payer: Medicaid Other | Admitting: *Deleted

## 2018-07-17 VITALS — BP 131/83 | HR 87

## 2018-07-17 DIAGNOSIS — Z3042 Encounter for surveillance of injectable contraceptive: Secondary | ICD-10-CM

## 2018-07-17 NOTE — Progress Notes (Signed)
Pt is in office for depo injection. Pt is on time for depo. Injection given, pt tolerated well.   Pt advised to RTO Jan 8-22 for next depo.  Pt has no other concerns.   Administrations This Visit    medroxyPROGESTERone (DEPO-PROVERA) injection 150 mg    Admin Date 07/17/2018 Action Given Dose 150 mg Route Intramuscular Administered By Lanney Gins, CMA

## 2018-09-09 ENCOUNTER — Telehealth: Payer: Self-pay

## 2018-09-09 DIAGNOSIS — B379 Candidiasis, unspecified: Secondary | ICD-10-CM

## 2018-09-09 MED ORDER — FLUCONAZOLE 150 MG PO TABS: 150.0000 mg | ORAL_TABLET | Freq: Once | ORAL | 0 refills | Status: AC

## 2018-09-09 NOTE — Telephone Encounter (Signed)
Pt called stating that she is having some vaginal itching. She states that she is not having any discharge. Rx sent to pharmacy per protocol.

## 2018-09-16 ENCOUNTER — Encounter (HOSPITAL_COMMUNITY): Payer: Self-pay | Admitting: Emergency Medicine

## 2018-09-16 ENCOUNTER — Emergency Department (HOSPITAL_COMMUNITY)
Admission: EM | Admit: 2018-09-16 | Discharge: 2018-09-17 | Disposition: A | Discharge status: Home / Self Care | Payer: Medicaid Other | Attending: Emergency Medicine | Admitting: Emergency Medicine

## 2018-09-16 ENCOUNTER — Other Ambulatory Visit: Payer: Self-pay

## 2018-09-16 DIAGNOSIS — I10 Essential (primary) hypertension: Secondary | ICD-10-CM | POA: Insufficient documentation

## 2018-09-16 DIAGNOSIS — J45909 Unspecified asthma, uncomplicated: Secondary | ICD-10-CM | POA: Insufficient documentation

## 2018-09-16 DIAGNOSIS — N898 Other specified noninflammatory disorders of vagina: Secondary | ICD-10-CM | POA: Diagnosis not present

## 2018-09-16 DIAGNOSIS — R300 Dysuria: Secondary | ICD-10-CM | POA: Diagnosis not present

## 2018-09-16 DIAGNOSIS — L089 Local infection of the skin and subcutaneous tissue, unspecified: Secondary | ICD-10-CM | POA: Diagnosis not present

## 2018-09-16 DIAGNOSIS — R222 Localized swelling, mass and lump, trunk: Secondary | ICD-10-CM | POA: Insufficient documentation

## 2018-09-16 NOTE — ED Triage Notes (Signed)
Patient has several small possible abscess on her buttocks area. Patient states she noticed them a few days ago. Patient states they are painful with pus draining.

## 2018-09-17 LAB — URINALYSIS, ROUTINE W REFLEX MICROSCOPIC
BACTERIA UA: NONE SEEN
Bilirubin Urine: NEGATIVE
Glucose, UA: NEGATIVE mg/dL
Ketones, ur: NEGATIVE mg/dL
Leukocytes, UA: NEGATIVE
Nitrite: NEGATIVE
Protein, ur: NEGATIVE mg/dL
Specific Gravity, Urine: 1.02 (ref 1.005–1.030)
pH: 6 (ref 5.0–8.0)

## 2018-09-17 LAB — WET PREP, GENITAL
Clue Cells Wet Prep HPF POC: NONE SEEN
Sperm: NONE SEEN
TRICH WET PREP: NONE SEEN
YEAST WET PREP: NONE SEEN

## 2018-09-17 LAB — POC URINE PREG, ED: Preg Test, Ur: NEGATIVE

## 2018-09-17 MED ORDER — CEPHALEXIN 500 MG PO CAPS: 500.0000 mg | ORAL_CAPSULE | Freq: Two times a day (BID) | ORAL | 0 refills | Status: AC

## 2018-09-17 MED ORDER — FLUCONAZOLE 150 MG PO TABS: 150.0000 mg | ORAL_TABLET | Freq: Every day | ORAL | 0 refills | Status: DC

## 2018-09-17 NOTE — Discharge Instructions (Addendum)
Please take medications as prescribed.  You have been tested for HIV, syphilis, chlamydia and gonorrhea.  These results will be available in approximately 3 days and you will be contacted by the hospital if the results are positive. Avoid sexual contact until you are aware of the results, and please inform all sexual partners if you test positive for any of these diseases.  Please follow up with your primary care provider within 5-7 days for re-evaluation of your symptoms. If you do not have a primary care provider, information for a healthcare clinic has been provided for you to make arrangements for follow up care. Please return to the emergency department for any new or worsening symptoms.

## 2018-09-17 NOTE — ED Provider Notes (Signed)
[facility] EMERGENCY DEPARTMENT Provider Note   CSN: [id number] Arrival date & time: 09/16/18  2316     History   Chief Complaint Chief Complaint  Patient presents with  . Abscess    HPI Erica Mack is a 32 y.o. female.  HPI  Patient is a 32 year old female with a history of asthma, chlamydia, gonorrhea, headache, hypertension, who presents the emergency department today for evaluation of possible abscesses to her buttock area.  Patient notes 3 different areas of concern including one on her back, right buttock and left gluteal cleft that have been present for the last 2 days.  Notes drainage from 2 of the areas.  Has had mild amount of pus from the area.  No fevers or chills.  States she is never had symptoms like this before.  Pain is constant in nature.  She also notes that she has had some vaginal itching and irritation for the last several days.  She contacted her PCP who gave her a prescription for fluconazole.  She states she took fluconazole 4 days ago and is still having some irritation.  States she has had unprotected intercourse with her partner.  They are monogamous.  She has had no vaginal discharge or bleeding.  Reports that she had some dysuria that has since resolved.  No frequency or hematuria.  No abdominal pain, nausea, vomiting, diarrhea.  Past Medical History:  Diagnosis Date  . Asthma   . Chlamydia contact, treated   . Gonorrhea   . Headache(784.0)   . Hypertension    no meds; dx 2008    Patient Active Problem List   Diagnosis Date Noted  . History of migraine 07/10/2016    Past Surgical History:  Procedure Laterality Date  . DILATION AND CURETTAGE OF UTERUS    . INDUCED ABORTION       OB History    Gravida  5   Para  3   Term  3   Preterm      AB  2   Living  3     SAB      TAB  2   Ectopic      Multiple  0   Live Births  3            Home Medications    Prior to Admission medications   Medication Sig Start  Date End Date Taking? Authorizing Provider  albuterol (PROVENTIL HFA;VENTOLIN HFA) 108 (90 Base) MCG/ACT inhaler Inhale 1-2 puffs into the lungs every 6 (six) hours as needed for wheezing or shortness of breath. 11/06/16   Brock Bad, MD  cephALEXin (KEFLEX) 500 MG capsule Take 1 capsule (500 mg total) by mouth 2 (two) times daily for 7 days. 09/17/18 09/24/18  Kassadie Pancake S, PA-C  fluconazole (DIFLUCAN) 150 MG tablet Take 1 tablet (150 mg total) by mouth daily. Take 1 tablet by mouth today.  If you are still continuing to have symptoms in 72 hours take the additional tablet at that time. 09/17/18   Mila Pair S, PA-C  ibuprofen (ADVIL,MOTRIN) 600 MG tablet Take 1 tablet (600 mg total) by mouth every 6 (six) hours. 01/22/17   Orvilla Cornwall A, CNM  medroxyPROGESTERone (DEPO-PROVERA) 150 MG/ML injection Inject 1 mL (150 mg total) into the muscle every 3 (three) months. Bring to office for administration. 07/09/18   Brock Bad, MD  triamterene-hydrochlorothiazide (DYAZIDE) 37.5-25 MG capsule Take 1 each (1 capsule total) by mouth daily. 05/10/17   Clearance Coots,  Bing Neighborsharles A, MD    Family History Family History  Problem Relation Age of Onset  . Hypertension Mother   . Diabetes Brother   . Hypertension Maternal Aunt   . Heart disease Maternal Aunt   . Cancer Maternal Grandmother   . Hypertension Maternal Grandmother   . Anesthesia problems Neg Hx     Social History Social History   Tobacco Use  . Smoking status: Never Smoker  . Smokeless tobacco: Never Used  Substance Use Topics  . Alcohol use: Yes    Alcohol/week: 6.0 standard drinks    Types: 3 Shots of liquor, 3 Standard drinks or equivalent per week    Comment: occasional alcohol  . Drug use: No    Types: Marijuana    Comment: prior use     Allergies   Aspirin   Review of Systems Review of Systems  Constitutional: Negative for fever.  HENT: Negative for ear pain and sore throat.   Eyes: Negative for visual  disturbance.  Respiratory: Negative for shortness of breath.   Cardiovascular: Negative for chest pain.  Gastrointestinal: Negative for abdominal pain, diarrhea, nausea and vomiting.  Genitourinary: Positive for dysuria. Negative for flank pain, frequency, hematuria and urgency.       Vaginal irritation  Skin:       abscesses  Neurological: Negative for headaches.  All other systems reviewed and are negative.   Physical Exam Updated Vital Signs BP 138/86 (BP Location: Right Arm)   Pulse 83   Temp 98.7 F (37.1 C) (Oral)   Resp 18   Ht 5\' 6"  (1.676 m)   Wt 95.3 kg   LMP 08/28/2018   SpO2 100%   BMI 33.89 kg/m   Physical Exam Vitals signs and nursing note reviewed.  Constitutional:      General: She is not in acute distress.    Appearance: She is well-developed.  HENT:     Head: Normocephalic and atraumatic.  Eyes:     Conjunctiva/sclera: Conjunctivae normal.  Neck:     Musculoskeletal: Neck supple.  Cardiovascular:     Rate and Rhythm: Normal rate and regular rhythm.     Heart sounds: No murmur.  Pulmonary:     Effort: Pulmonary effort is normal.  Abdominal:     Palpations: Abdomen is soft.     Tenderness: There is no abdominal tenderness. There is no guarding or rebound.  Genitourinary:    Comments: Exam performed by Karrie Meresortni S Lakai Moree,  exam chaperoned Date: 09/17/2018 Pelvic exam: normal external genitalia without evidence of trauma. VULVA: normal appearing vulva with no masses, tenderness or lesion. VAGINA: normal appearing vagina with normal color. Discharge present as noted below CERVIX: normal appearing cervix without lesions, cervical motion tenderness absent, cervical os closed with out purulent discharge; vaginal discharge - present that is white and has consistency of cottage cheese. Wet prep and DNA probe for chlamydia and GC obtained.   ADNEXA: normal adnexa in size, nontender and no masses UTERUS: uterus is normal size, shape, consistency and nontender.   Skin:    General: Skin is warm and dry.     Comments: 1 cm area of induration to the right lower back.  No fluctuance noted.  Additional 1 cm area of induration to the right lateral buttock with no evidence of fluctuance.  2 cm x 2 cm area of induration to the left gluteal cleft without fluctuance.  There are no areas of erythema.  No tracking towards the rectum.  Neurological:  Mental Status: She is alert.  Psychiatric:        Mood and Affect: Mood normal.      ED Treatments / Results  Labs (all labs ordered are listed, but only abnormal results are displayed) Labs Reviewed  WET PREP, GENITAL - Abnormal; Notable for the following components:      Result Value   WBC, Wet Prep HPF POC FEW (*)    All other components within normal limits  URINALYSIS, ROUTINE W REFLEX MICROSCOPIC  POC URINE PREG, ED  GC/CHLAMYDIA PROBE AMP ([facility]) NOT AT [facility]    EKG None  Radiology No results found.  Procedures Procedures (including critical care time)  EMERGENCY DEPARTMENT US SOFT TISSUE INTERPRETATION "Study: Limited Soft Tissue Ultrasound"  INDICATIONS: Soft tissue infection Multiple views of the body part were obtained in real-time with a multi-frequency linear probe  PERFORMED BY: Myself IMAGES ARCHIVED?: No SIDE:Left and Right  BODY PART:right buttock, left gluteal cleft INTERPRETATION:  No abcess noted and Cellulitis present     Medications Ordered in ED Medications - No data to display   Initial Impression / Assessment and Plan / ED Course  I have reviewed the triage vital signs and the nursing notes.  Pertinent labs & imaging results that were available during my care of the patient were reviewed by me and considered in my medical decision making (see chart for details).     Final Clinical Impressions(s) / ED Diagnoses   Final diagnoses:  Skin infection  Vaginal irritation   Patient presented for multiple complaints.  Is concern for possible abscesses  to the lower back and buttock region.  She has 3 areas of induration to the right lower back, right buttock and left gluteal cleft.  There is no obvious fluctuance on exam to any of the areas of concerned.  Bedside ultrasound was performed of the left gluteal cleft without evidence of abscess.  Also obtain ultrasound of area of concern in the right buttock without evidence of fluid collection.  Suspect cellulitis, will treat with antibiotics.  Have advised her to follow-up for reevaluation of symptoms do not resolve or seem to be worsening.  She is also complaining of some vaginal itching and irritation.  She is concerned for a yeast infection.  She denies concern for STDs.  Offered prophylactic STD treatment and she declines.  On pelvic exam she does have white cottage cheese discharge present.  She has no cervical motion tenderness, no uterine or adnexal tenderness.  Exam is consistent with yeast infection.  Wet prep with no yeast noted, however clinically pt appears to have yeast infection and will treat accordingly.  GC chlamydia obtained. UA negative for UTI. Will treat with fluconazole and have her follow-up with her PCP.  Advised her to return if worse.  ED Discharge Orders         Ordered    cephALEXin (KEFLEX) 500 MG capsule  2 times daily     09/17/18 0129    fluconazole (DIFLUCAN) 150 MG tablet  Daily     09/17/18 0132           Jaclyn Carew S, PA-C 09/17/18 0200    Devoria AlbeKnapp, Iva, MD 09/17/18 [phone]

## 2018-09-19 LAB — GC/CHLAMYDIA PROBE AMP (~~LOC~~) NOT AT ARMC
Chlamydia: NEGATIVE
Neisseria Gonorrhea: NEGATIVE

## 2018-10-03 ENCOUNTER — Ambulatory Visit: Payer: Medicaid Other

## 2018-10-09 ENCOUNTER — Ambulatory Visit (INDEPENDENT_AMBULATORY_CARE_PROVIDER_SITE_OTHER): Payer: Medicaid Other

## 2018-10-09 DIAGNOSIS — Z3042 Encounter for surveillance of injectable contraceptive: Secondary | ICD-10-CM | POA: Diagnosis not present

## 2018-10-09 MED ORDER — MEDROXYPROGESTERONE ACETATE 150 MG/ML IM SUSP
150.0000 mg | Freq: Once | INTRAMUSCULAR | Status: AC
  Administered 2018-10-09: 150 mg via INTRAMUSCULAR

## 2018-10-09 NOTE — Progress Notes (Signed)
Presents for DEPO, given in RUOQ, tolerated well.  Next DEPO 04/02-16/2020  Patient advised to make appointment for Annual Exam before next Depo.  Administrations This Visit    medroxyPROGESTERone (DEPO-PROVERA) injection 150 mg    Admin Date 10/09/2018 Action Given Dose 150 mg Route Intramuscular Administered By Maretta BeesMcGlashan, Jaimere Feutz J, RMA

## 2018-12-03 ENCOUNTER — Other Ambulatory Visit: Payer: Self-pay | Admitting: Obstetrics

## 2018-12-03 DIAGNOSIS — Z30013 Encounter for initial prescription of injectable contraceptive: Secondary | ICD-10-CM

## 2018-12-04 ENCOUNTER — Ambulatory Visit: Payer: Medicaid Other | Admitting: Obstetrics

## 2018-12-06 ENCOUNTER — Other Ambulatory Visit: Payer: Self-pay

## 2018-12-06 ENCOUNTER — Encounter: Payer: Self-pay | Admitting: Obstetrics

## 2018-12-06 ENCOUNTER — Ambulatory Visit (INDEPENDENT_AMBULATORY_CARE_PROVIDER_SITE_OTHER): Payer: Medicaid Other | Admitting: Obstetrics

## 2018-12-06 ENCOUNTER — Other Ambulatory Visit (HOSPITAL_COMMUNITY)
Admission: RE | Admit: 2018-12-06 | Discharge: 2018-12-06 | Disposition: A | Discharge status: Home / Self Care | Payer: Medicaid Other | Source: Ambulatory Visit | Attending: Obstetrics | Admitting: Obstetrics

## 2018-12-06 VITALS — BP 134/80 | HR 90 | Wt 218.0 lb

## 2018-12-06 DIAGNOSIS — Z01419 Encounter for gynecological examination (general) (routine) without abnormal findings: Secondary | ICD-10-CM

## 2018-12-06 DIAGNOSIS — I10 Essential (primary) hypertension: Secondary | ICD-10-CM

## 2018-12-06 DIAGNOSIS — Z113 Encounter for screening for infections with a predominantly sexual mode of transmission: Secondary | ICD-10-CM | POA: Diagnosis not present

## 2018-12-06 DIAGNOSIS — Z3042 Encounter for surveillance of injectable contraceptive: Secondary | ICD-10-CM

## 2018-12-06 DIAGNOSIS — B851 Pediculosis due to Pediculus humanus corporis: Secondary | ICD-10-CM | POA: Diagnosis not present

## 2018-12-06 DIAGNOSIS — N944 Primary dysmenorrhea: Secondary | ICD-10-CM

## 2018-12-06 DIAGNOSIS — N898 Other specified noninflammatory disorders of vagina: Secondary | ICD-10-CM

## 2018-12-06 MED ORDER — TRIAMTERENE-HCTZ 37.5-25 MG PO CAPS: 1.0000 | ORAL_CAPSULE | Freq: Every day | ORAL | 11 refills | Status: DC

## 2018-12-06 MED ORDER — IBUPROFEN 800 MG PO TABS: 800.0000 mg | ORAL_TABLET | Freq: Three times a day (TID) | ORAL | 5 refills | Status: DC | PRN

## 2018-12-06 MED ORDER — MEDROXYPROGESTERONE ACETATE 150 MG/ML IM SUSP: 150.0000 mg | INTRAMUSCULAR | 1 refills | Status: DC

## 2018-12-06 NOTE — Progress Notes (Signed)
Pt presents for annual, pap, and all STD testing.  Pt request BP med rf.

## 2018-12-06 NOTE — Progress Notes (Signed)
Subjective:        Erica Mack is a 33 y.o. female here for a routine exam.  Current complaints: None.    Personal health questionnaire:  Is patient Ashkenazi Jewish, have a family history of breast and/or ovarian cancer: no Is there a family history of uterine cancer diagnosed at age < 23, gastrointestinal cancer, urinary tract cancer, family member who is a Personnel officer syndrome-associated carrier: no Is the patient overweight and hypertensive, family history of diabetes, personal history of gestational diabetes, preeclampsia or PCOS: no Is patient over 3, have PCOS,  family history of premature CHD under age 27, diabetes, smoke, have hypertension or peripheral artery disease:  no At any time, has a partner hit, kicked or otherwise hurt or frightened you?: no Over the past 2 weeks, have you felt down, depressed or hopeless?: no Over the past 2 weeks, have you felt little interest or pleasure in doing things?:no   Gynecologic History No LMP recorded. Patient has had an injection. Contraception: Depo-Provera injections Last Pap: 2018. Results were: normal Last mammogram: n/a. Results were: n/a  Obstetric History OB History  Gravida Para Term Preterm AB Living  SAB TAB Ectopic Multiple Live Births    2   0 3    # Outcome Date GA Lbr Len/2nd Weight Sex Delivery Anes PTL Lv  5 Term 01/20/17 [redacted]w[redacted]d 07:02 / 00:10 6 lb 3.3 oz (2.815 kg) M Vag-Spont EPI  LIV  4 Term 09/05/09 [redacted]w[redacted]d  6 lb 4 oz (2.835 kg) F Vag-Spont EPI  LIV     Birth Comments: hyperemesis   3 Term 10/25/07 [redacted]w[redacted]d  4 lb 11 oz (2.126 kg) M Vag-Spont EPI  LIV     Birth Comments: No complications  2 TAB           1 TAB             Past Medical History:  Diagnosis Date  . Asthma   . Chlamydia contact, treated   . Gonorrhea   . Headache(784.0)   . Hypertension    no meds; dx 2008    Past Surgical History:  Procedure Laterality Date  . DILATION AND CURETTAGE OF UTERUS    . INDUCED ABORTION        Current Outpatient Medications:  .  medroxyPROGESTERone (DEPO-PROVERA) 150 MG/ML injection, Inject 1 mL (150 mg total) into the muscle every 3 (three) months. Bring to office for administration., Disp: 1 mL, Rfl: 1 .  albuterol (PROVENTIL HFA;VENTOLIN HFA) 108 (90 Base) MCG/ACT inhaler, Inhale 1-2 puffs into the lungs every 6 (six) hours as needed for wheezing or shortness of breath. (Patient not taking: Reported on 12/06/2018), Disp: 1 Inhaler, Rfl: 0 .  fluconazole (DIFLUCAN) 150 MG tablet, Take 1 tablet (150 mg total) by mouth daily. Take 1 tablet by mouth today.  If you are still continuing to have symptoms in 72 hours take the additional tablet at that time. (Patient not taking: Reported on 12/06/2018), Disp: 2 tablet, Rfl: 0 .  ibuprofen (ADVIL,MOTRIN) 600 MG tablet, Take 1 tablet (600 mg total) by mouth every 6 (six) hours. (Patient not taking: Reported on 12/06/2018), Disp: 120 tablet, Rfl: 2 .  ibuprofen (ADVIL,MOTRIN) 800 MG tablet, Take 1 tablet (800 mg total) by mouth every 8 (eight) hours as needed., Disp: 30 tablet, Rfl: 5 .  triamterene-hydrochlorothiazide (DYAZIDE) 37.5-25 MG capsule, Take 1 each (1 capsule total) by mouth daily., Disp: 30 capsule, Rfl:  11  Current Facility-Administered Medications:  .  medroxyPROGESTERone (DEPO-PROVERA) injection 150 mg, 150 mg, Intramuscular, Q90 days, Brock Bad, MD, 150 mg at 07/17/18 1157 Allergies  Allergen Reactions  . Aspirin Itching    Social History   Tobacco Use  . Smoking status: Never Smoker  . Smokeless tobacco: Never Used  Substance Use Topics  . Alcohol use: Yes    Alcohol/week: 6.0 standard drinks    Types: 3 Shots of liquor, 3 Standard drinks or equivalent per week    Comment: occasional alcohol    Family History  Problem Relation Age of Onset  . Hypertension Mother   . Diabetes Brother   . Hypertension Maternal Aunt   . Heart disease Maternal Aunt   . Cancer Maternal Grandmother   . Hypertension Maternal  Grandmother   . Anesthesia problems Neg Hx       Review of Systems  Constitutional: negative for fatigue and weight loss Respiratory: negative for cough and wheezing Cardiovascular: negative for chest pain, fatigue and palpitations Gastrointestinal: negative for abdominal pain and change in bowel habits Musculoskeletal:negative for myalgias Neurological: negative for gait problems and tremors Behavioral/Psych: negative for abusive relationship, depression Endocrine: negative for temperature intolerance    Genitourinary:negative for abnormal menstrual periods, genital lesions, hot flashes, sexual problems and vaginal discharge Integument/breast: negative for breast lump, breast tenderness, nipple discharge and skin lesion(s)    Objective:       BP 134/80   Pulse 90   Wt 218 lb (98.9 kg)   BMI 35.19 kg/m  General:   alert  Skin:   no rash or abnormalities  Lungs:   clear to auscultation bilaterally  Heart:   regular rate and rhythm, S1, S2 normal, no murmur, click, rub or gallop  Breasts:   normal without suspicious masses, skin or nipple changes or axillary nodes  Abdomen:  normal findings: no organomegaly, soft, non-tender and no hernia  Pelvis:  External genitalia: normal general appearance Urinary system: urethral meatus normal and bladder without fullness, nontender Vaginal: normal without tenderness, induration or masses Cervix: normal appearance Adnexa: normal bimanual exam Uterus: anteverted and non-tender, normal size   Lab Review Urine pregnancy test Labs reviewed yes Radiologic studies reviewed no  50% of 20 min visit spent on counseling and coordination of care.   Assessment:   1. Encounter for routine gynecological examination with Papanicolaou smear of cervix Rx: - Cytology - PAP( [place])  2. Vaginal discharge Rx: - Cervicovaginal ancillary only( [place])  3. Primary dysmenorrhea Rx: - ibuprofen (ADVIL,MOTRIN) 800 MG tablet; Take 1  tablet (800 mg total) by mouth every 8 (eight) hours as needed.  Dispense: 30 tablet; Refill: 5  4. Screening for STD (sexually transmitted disease) Rx: - Hepatitis B surface antigen - Hepatitis C antibody - HIV Antibody (routine testing w rflx) - RPR  5. Encounter for surveillance of injectable contraceptive Rx: - medroxyPROGESTERone (DEPO-PROVERA) 150 MG/ML injection; Inject 1 mL (150 mg total) into the muscle every 3 (three) months. Bring to office for administration.  Dispense: 1 mL; Refill: 1  6. Hypertension, unspecified type Rx: - triamterene-hydrochlorothiazide (DYAZIDE) 37.5-25 MG capsule; Take 1 each (1 capsule total) by mouth daily.  Dispense: 30 capsule; Refill: 11    Plan:    Education reviewed: calcium supplements, depression evaluation, low fat, low cholesterol diet, safe sex/STD prevention, self breast exams and weight bearing exercise. Contraception: Depo-Provera injections. Follow up in: 1 year.   Meds ordered this encounter  Medications  .  triamterene-hydrochlorothiazide (DYAZIDE) 37.5-25 MG capsule    Sig: Take 1 each (1 capsule total) by mouth daily.    Dispense:  30 capsule    Refill:  11  . medroxyPROGESTERone (DEPO-PROVERA) 150 MG/ML injection    Sig: Inject 1 mL (150 mg total) into the muscle every 3 (three) months. Bring to office for administration.    Dispense:  1 mL    Refill:  1  . ibuprofen (ADVIL,MOTRIN) 800 MG tablet    Sig: Take 1 tablet (800 mg total) by mouth every 8 (eight) hours as needed.    Dispense:  30 tablet    Refill:  5   Orders Placed This Encounter  Procedures  . Hepatitis B surface antigen  . Hepatitis C antibody  . HIV Antibody (routine testing w rflx)  . RPR    Brock Bad MD 12-06-2018

## 2018-12-07 LAB — RPR: RPR Ser Ql: NONREACTIVE

## 2018-12-07 LAB — HEPATITIS C ANTIBODY: Hep C Virus Ab: <0.1 s/co ratio (ref 0.0–0.9)

## 2018-12-07 LAB — HEPATITIS B SURFACE ANTIGEN: Hepatitis B Surface Ag: NEGATIVE

## 2018-12-07 LAB — HIV ANTIBODY (ROUTINE TESTING W REFLEX): HIV Screen 4th Generation wRfx: NONREACTIVE

## 2018-12-09 LAB — CERVICOVAGINAL ANCILLARY ONLY
Bacterial vaginitis: NEGATIVE
Candida vaginitis: NEGATIVE
Chlamydia: NEGATIVE
Neisseria Gonorrhea: NEGATIVE
Trichomonas: NEGATIVE

## 2018-12-10 LAB — CYTOLOGY - PAP
Diagnosis: NEGATIVE
HPV: NOT DETECTED

## 2019-01-01 ENCOUNTER — Ambulatory Visit: Payer: Medicaid Other

## 2019-01-06 ENCOUNTER — Ambulatory Visit: Payer: Medicaid Other

## 2019-01-07 ENCOUNTER — Ambulatory Visit (INDEPENDENT_AMBULATORY_CARE_PROVIDER_SITE_OTHER): Payer: Medicaid Other

## 2019-01-07 ENCOUNTER — Other Ambulatory Visit: Payer: Self-pay

## 2019-01-07 DIAGNOSIS — Z3042 Encounter for surveillance of injectable contraceptive: Secondary | ICD-10-CM

## 2019-01-07 NOTE — Progress Notes (Signed)
Nurse visit for Depo. Pt is within her window.  Depo given RUOQ without difficulty.  Next inj due June 30-July 14, pt agrees.

## 2019-01-13 NOTE — Progress Notes (Signed)
I have reviewed the chart and agree with nursing staff's documentation of this patient's encounter.  Purcell Bing, MD 01/13/2019 12:34 PM

## 2019-02-12 ENCOUNTER — Ambulatory Visit (INDEPENDENT_AMBULATORY_CARE_PROVIDER_SITE_OTHER): Payer: Medicaid Other | Admitting: Obstetrics

## 2019-02-12 ENCOUNTER — Encounter: Payer: Self-pay | Admitting: Obstetrics

## 2019-02-12 ENCOUNTER — Other Ambulatory Visit: Payer: Self-pay

## 2019-02-12 DIAGNOSIS — Z3042 Encounter for surveillance of injectable contraceptive: Secondary | ICD-10-CM

## 2019-02-12 DIAGNOSIS — R635 Abnormal weight gain: Secondary | ICD-10-CM

## 2019-02-12 DIAGNOSIS — Z3009 Encounter for other general counseling and advice on contraception: Secondary | ICD-10-CM | POA: Diagnosis not present

## 2019-02-12 NOTE — Progress Notes (Signed)
   TELEHEALTH VIRTUAL GYNECOLOGY VISIT ENCOUNTER NOTE  I connected with Erica Mack on 02/12/19 at 10:45 AM EDT by telephone at home and verified that I am speaking with the correct person using two identifiers.   I discussed the limitations, risks, security and privacy concerns of performing an evaluation and management service by telephone and the availability of in person appointments. I also discussed with the patient that there may be a patient responsible charge related to this service. The patient expressed understanding and agreed to proceed.   History:  Erica Mack is a 33 y.o. [phone] female being evaluated today for contraceptive counseling. She complains to excessive weight gain since starting Depo Provera, and can't seem to control it with dietary changes and exercise.  She would like to discontinue Depo Provera and start Nexlanon.  She denies any abnormal vaginal discharge, bleeding, pelvic pain or other concerns.       Past Medical History:  Diagnosis Date  . Asthma   . Chlamydia contact, treated   . Gonorrhea   . Headache(784.0)   . Hypertension    no meds; dx 2008   Past Surgical History:  Procedure Laterality Date  . DILATION AND CURETTAGE OF UTERUS    . INDUCED ABORTION     The following portions of the patient's history were reviewed and updated as appropriate: allergies, current medications, past family history, past medical history, past social history, past surgical history and problem list.   Health Maintenance:  Normal pap and negative HRHPV on 12-06-2018.    Review of Systems:  Pertinent items noted in HPI and remainder of comprehensive ROS otherwise negative.  Physical Exam:   General:  Alert, oriented and cooperative.   Mental Status: Normal mood and affect perceived. Normal judgment and thought content.  Physical exam deferred due to nature of the encounter  Labs and Imaging No results found for this or any previous visit (from the past 336  hour(s)). No results found.    Assessment and Plan:     1. Encounter for other general counseling and advice on contraception  2. Encounter for surveillance of injectable contraceptive - displeased with Depo Provera because of excessive weight gain - wants to D/C Depo and start Nexplanon  3. Excessive weight gain since starting Depo Provera Injections      I discussed the assessment and treatment plan with the patient. The patient was provided an opportunity to ask questions and all were answered. The patient agreed with the plan and demonstrated an understanding of the instructions.   The patient was advised to call back or seek an in-person evaluation/go to the ED if the symptoms worsen or if the condition fails to improve as anticipated.  I provided 10 minutes of non-face-to-face time during this encounter.   Coral Ceo, MD Center for [facility], [facility] 02-12-2019

## 2019-02-12 NOTE — Addendum Note (Signed)
Addended by: Coral Ceo A on: 02/12/2019 02:29 PM   Modules accepted: Level of Service

## 2019-02-12 NOTE — Progress Notes (Signed)
[facility] Consult for Contraception, patient is currently on DEPO and wants to switch to the Nexplanon, because she is gaining too much weight.

## 2019-02-24 ENCOUNTER — Other Ambulatory Visit: Payer: Self-pay

## 2019-02-24 ENCOUNTER — Ambulatory Visit: Payer: Medicaid Other | Admitting: Obstetrics and Gynecology

## 2019-02-24 ENCOUNTER — Encounter: Payer: Self-pay | Admitting: Obstetrics and Gynecology

## 2019-02-24 VITALS — BP 132/80 | HR 90 | Wt 222.9 lb

## 2019-02-24 DIAGNOSIS — Z3202 Encounter for pregnancy test, result negative: Secondary | ICD-10-CM | POA: Diagnosis not present

## 2019-02-24 DIAGNOSIS — Z30017 Encounter for initial prescription of implantable subdermal contraceptive: Secondary | ICD-10-CM

## 2019-02-24 DIAGNOSIS — R635 Abnormal weight gain: Secondary | ICD-10-CM

## 2019-02-24 LAB — POCT URINE PREGNANCY: Preg Test, Ur: NEGATIVE

## 2019-02-24 MED ORDER — ETONOGESTREL 68 MG ~~LOC~~ IMPL: 68.0000 mg | DRUG_IMPLANT | Freq: Once | SUBCUTANEOUS | Status: AC

## 2019-02-24 NOTE — Progress Notes (Signed)
     GYNECOLOGY OFFICE PROCEDURE NOTE  Erica Mack is a 33 y.o. [phone] here for Nexplanon insertion.  Last pap smear was on 11/14/18 and was normal.  No other gynecologic concerns. Denies unprotected intercourse within the last 14 days. UPT: negative  Reviewed risks of insertion of implant including risk of infection, bleeding, damage to surrounding tissues and organs, migration of implant, difficult removal. She verbalizes understanding and affirms desire to proceed. Consent signed.   Nexplanon Insertion Procedure Patient identified, informed consent performed, consent signed.   Patient does understand that irregular bleeding is a very common side effect of this medication. She was advised to have backup contraception for one week after placement. Pregnancy test in clinic today was negative.  An adequate timeout was performed.  Patient's left arm was prepped and draped in the usual sterile fashion. The ruler used to measure and mark insertion area.  Patient was prepped with alcohol swab and then injected with 3 ml of 1% lidocaine.  She was prepped with betadine, Nexplanon removed from packaging,  Device confirmed in needle, then inserted full length of needle and withdrawn per handbook instructions. Nexplanon was able to palpated in the patient's arm; patient palpated the insert herself. There was minimal blood loss.  Patient insertion site covered with gauze and a pressure bandage to reduce any bruising.  The patient tolerated the procedure well and was given post procedure instructions.   Device Info Exp: 05/21/2021 Lot#: [id number]  K. Therese Sarah, M.D. Attending Center for [employer] [facility])

## 2019-02-24 NOTE — Progress Notes (Signed)
Pt is in the office for nexplanon insertion. Pt previously on depo, UPT is negative.

## 2019-02-25 ENCOUNTER — Encounter: Payer: Self-pay | Admitting: Obstetrics

## 2019-03-13 ENCOUNTER — Encounter: Payer: Self-pay | Admitting: Certified Nurse Midwife

## 2019-03-13 ENCOUNTER — Encounter: Payer: Self-pay | Admitting: Obstetrics

## 2019-03-13 ENCOUNTER — Other Ambulatory Visit: Payer: Self-pay

## 2019-03-13 ENCOUNTER — Ambulatory Visit: Payer: Medicaid Other | Admitting: Certified Nurse Midwife

## 2019-03-13 VITALS — BP 132/90 | HR 83 | Wt 218.0 lb

## 2019-03-13 DIAGNOSIS — Z3202 Encounter for pregnancy test, result negative: Secondary | ICD-10-CM | POA: Diagnosis not present

## 2019-03-13 DIAGNOSIS — R300 Dysuria: Secondary | ICD-10-CM

## 2019-03-13 DIAGNOSIS — Z975 Presence of (intrauterine) contraceptive device: Secondary | ICD-10-CM

## 2019-03-13 LAB — POCT URINALYSIS DIPSTICK
Bilirubin, UA: NEGATIVE
Glucose, UA: NEGATIVE
Ketones, UA: POSITIVE
Nitrite, UA: NEGATIVE
Protein, UA: NEGATIVE
Spec Grav, UA: 1.015 (ref 1.010–1.025)
Urobilinogen, UA: 0.2 E.U./dL
pH, UA: 7 (ref 5.0–8.0)

## 2019-03-13 LAB — POCT URINE PREGNANCY: Preg Test, Ur: NEGATIVE

## 2019-03-13 MED ORDER — SULFAMETHOXAZOLE-TRIMETHOPRIM 800-160 MG PO TABS: 1.0000 | ORAL_TABLET | Freq: Two times a day (BID) | ORAL | 0 refills | Status: DC

## 2019-03-13 NOTE — Progress Notes (Signed)
Pt is here with c/o dysuria for a couple days. Pt also reports she got a nexplanon in her L arm at the beginning of the month and she cannot feel it.

## 2019-03-13 NOTE — Patient Instructions (Signed)
Urinary Tract Infection, Adult A urinary tract infection (UTI) is an infection of any part of the urinary tract. The urinary tract includes:  The kidneys.  The ureters.  The bladder.  The urethra. These organs make, store, and get rid of pee (urine) in the body. What are the causes? This is caused by germs (bacteria) in your genital area. These germs grow and cause swelling (inflammation) of your urinary tract. What increases the risk? You are more likely to develop this condition if:  You have a small, thin tube (catheter) to drain pee.  You cannot control when you pee or poop (incontinence).  You are female, and: ? You use these methods to prevent pregnancy: ? A medicine that kills sperm (spermicide). ? A device that blocks sperm (diaphragm). ? You have low levels of a female hormone (estrogen). ? You are pregnant.  You have genes that add to your risk.  You are sexually active.  You take antibiotic medicines.  You have trouble peeing because of: ? A prostate that is bigger than normal, if you are female. ? A blockage in the part of your body that drains pee from the bladder (urethra). ? A kidney stone. ? A nerve condition that affects your bladder (neurogenic bladder). ? Not getting enough to drink. ? Not peeing often enough.  You have other conditions, such as: ? Diabetes. ? A weak disease-fighting system (immune system). ? Sickle cell disease. ? Gout. ? Injury of the spine. What are the signs or symptoms? Symptoms of this condition include:  Needing to pee right away (urgently).  Peeing often.  Peeing small amounts often.  Pain or burning when peeing.  Blood in the pee.  Pee that smells bad or not like normal.  Trouble peeing.  Pee that is cloudy.  Fluid coming from the vagina, if you are female.  Pain in the belly or lower back. Other symptoms include:  Throwing up (vomiting).  No urge to eat.  Feeling mixed up (confused).  Being tired  and grouchy (irritable).  A fever.  Watery poop (diarrhea). How is this treated? This condition may be treated with:  Antibiotic medicine.  Other medicines.  Drinking enough water. Follow these instructions at home:  Medicines  Take over-the-counter and prescription medicines only as told by your doctor.  If you were prescribed an antibiotic medicine, take it as told by your doctor. Do not stop taking it even if you start to feel better. General instructions  Make sure you: ? Pee until your bladder is empty. ? Do not hold pee for a long time. ? Empty your bladder after sex. ? Wipe from front to back after pooping if you are a female. Use each tissue one time when you wipe.  Drink enough fluid to keep your pee pale yellow.  Keep all follow-up visits as told by your doctor. This is important. Contact a doctor if:  You do not get better after 1-2 days.  Your symptoms go away and then come back. Get help right away if:  You have very bad back pain.  You have very bad pain in your lower belly.  You have a fever.  You are sick to your stomach (nauseous).  You are throwing up. Summary  A urinary tract infection (UTI) is an infection of any part of the urinary tract.  This condition is caused by germs in your genital area.  There are many risk factors for a UTI. These include having a small, thin   tube to drain pee and not being able to control when you pee or poop.  Treatment includes antibiotic medicines for germs.  Drink enough fluid to keep your pee pale yellow. This information is not intended to replace advice given to you by your health care provider. Make sure you discuss any questions you have with your health care provider. Document Released: 02/28/2008 Document Revised: 03/21/2018 Document Reviewed: 03/21/2018 Elsevier Interactive Patient Education  2019 Elsevier Inc[employer]  

## 2019-03-13 NOTE — Progress Notes (Signed)
History:  Ms. CRISELDA STARKE is a 33 y.o. [phone] who presents to clinic today for dysuria and "missing Nexplanon". Patient reports dysuria for the past 2-3 days, reports never having a UTI before. Denies discharge or IC with new partner. Reports having Nexplanon placed at beginning of this month and can not feel it in left arm.   The following portions of the patient's history were reviewed and updated as appropriate: allergies, current medications, family history, past medical history, social history, past surgical history and problem list.  Review of Systems:  Review of Systems  Constitutional: Negative.   Respiratory: Negative.   Cardiovascular: Negative.   Genitourinary: Positive for dysuria and frequency. Negative for hematuria and urgency.  Musculoskeletal: Negative.   Neurological: Negative.       Objective:  Physical Exam BP 132/90   Pulse 83   Wt 218 lb (98.9 kg)   BMI 35.19 kg/m  Physical Exam Vitals signs reviewed.  Constitutional:      General: She is not in acute distress. Cardiovascular:     Rate and Rhythm: Normal rate and regular rhythm.  Pulmonary:     Effort: Pulmonary effort is normal.     Breath sounds: Normal breath sounds.  Skin:    General: Skin is warm and dry.  Neurological:     Mental Status: She is alert and oriented to person, place, and time.  Psychiatric:        Mood and Affect: Mood normal.        Behavior: Behavior normal.        Thought Content: Thought content normal.    Labs and Imaging Results for orders placed or performed in visit on 03/13/19 (from the past 24 hour(s))  POCT Urinalysis Dipstick     Status: Abnormal   Collection Time: 03/13/19  8:37 AM  Result Value Ref Range   Color, UA yellow    Clarity, UA clear    Glucose, UA Negative Negative   Bilirubin, UA neg    Ketones, UA pos    Spec Grav, UA 1.015 1.010 - 1.025   Blood, UA large    pH, UA 7.0 5.0 - 8.0   Protein, UA Negative Negative   Urobilinogen, UA 0.2 0.2  or 1.0 E.U./dL   Nitrite, UA neg    Leukocytes, UA Large (3+) (A) Negative   Appearance     Odor    POCT urine pregnancy     Status: None   Collection Time: 03/13/19  8:38 AM  Result Value Ref Range   Preg Test, Ur Negative Negative    Assessment & Plan:  1. Dysuria - Will tx for UTI prophylactic, culture results pending- will manage accordingly  - POCT Urinalysis Dipstick - Urine Culture - sulfamethoxazole-trimethoprim (BACTRIM DS) 800-160 MG tablet; Take 1 tablet by mouth 2 (two) times daily.  Dispense: 6 tablet; Refill: 0  2. Nexplanon in place - Nexplanon palpated in left arm, given patient opportunity to palpate nexplanon for correct placement- patient reports she was looking for Nexplanon in different area  - POCT urine pregnancy   Lajean Manes, CNM 03/13/2019 8:44 AM

## 2019-03-15 LAB — URINE CULTURE

## 2019-04-08 ENCOUNTER — Ambulatory Visit: Payer: Medicaid Other

## 2019-04-10 ENCOUNTER — Other Ambulatory Visit: Payer: Self-pay | Admitting: *Deleted

## 2019-04-10 DIAGNOSIS — B379 Candidiasis, unspecified: Secondary | ICD-10-CM

## 2019-04-10 DIAGNOSIS — T3695XA Adverse effect of unspecified systemic antibiotic, initial encounter: Secondary | ICD-10-CM

## 2019-04-10 MED ORDER — FLUCONAZOLE 150 MG PO TABS: 150.0000 mg | ORAL_TABLET | Freq: Once | ORAL | 0 refills | Status: AC

## 2019-04-10 NOTE — Progress Notes (Signed)
Pt called to office stating she has yeast infection after recent antibiotic use, request pill to treat. Diflucan was sent per protocol.

## 2019-05-13 DIAGNOSIS — H5213 Myopia, bilateral: Secondary | ICD-10-CM | POA: Diagnosis not present

## 2019-05-14 DIAGNOSIS — H5213 Myopia, bilateral: Secondary | ICD-10-CM | POA: Diagnosis not present

## 2019-07-02 DIAGNOSIS — H5213 Myopia, bilateral: Secondary | ICD-10-CM | POA: Diagnosis not present

## 2019-08-08 ENCOUNTER — Telehealth: Payer: Self-pay

## 2019-08-08 ENCOUNTER — Other Ambulatory Visit: Payer: Self-pay | Admitting: Obstetrics

## 2019-08-08 NOTE — Telephone Encounter (Signed)
Pt calling requesting Rx for Bronchitis to be refilled?

## 2019-08-12 ENCOUNTER — Ambulatory Visit (HOSPITAL_COMMUNITY)
Admission: EM | Admit: 2019-08-12 | Discharge: 2019-08-12 | Disposition: A | Discharge status: Home / Self Care | Payer: Medicaid Other | Attending: Urgent Care | Admitting: Urgent Care

## 2019-08-12 ENCOUNTER — Other Ambulatory Visit: Payer: Self-pay

## 2019-08-12 ENCOUNTER — Encounter (HOSPITAL_COMMUNITY): Payer: Self-pay

## 2019-08-12 DIAGNOSIS — I10 Essential (primary) hypertension: Secondary | ICD-10-CM | POA: Diagnosis not present

## 2019-08-12 DIAGNOSIS — Z20828 Contact with and (suspected) exposure to other viral communicable diseases: Secondary | ICD-10-CM | POA: Insufficient documentation

## 2019-08-12 DIAGNOSIS — Z886 Allergy status to analgesic agent status: Secondary | ICD-10-CM | POA: Insufficient documentation

## 2019-08-12 DIAGNOSIS — Z809 Family history of malignant neoplasm, unspecified: Secondary | ICD-10-CM | POA: Insufficient documentation

## 2019-08-12 DIAGNOSIS — Z8249 Family history of ischemic heart disease and other diseases of the circulatory system: Secondary | ICD-10-CM | POA: Diagnosis not present

## 2019-08-12 DIAGNOSIS — H04201 Unspecified epiphora, right lacrimal gland: Secondary | ICD-10-CM | POA: Diagnosis not present

## 2019-08-12 DIAGNOSIS — Z833 Family history of diabetes mellitus: Secondary | ICD-10-CM | POA: Diagnosis not present

## 2019-08-12 DIAGNOSIS — R05 Cough: Secondary | ICD-10-CM | POA: Diagnosis not present

## 2019-08-12 DIAGNOSIS — R059 Cough, unspecified: Secondary | ICD-10-CM

## 2019-08-12 DIAGNOSIS — J45909 Unspecified asthma, uncomplicated: Secondary | ICD-10-CM | POA: Diagnosis not present

## 2019-08-12 DIAGNOSIS — Z79899 Other long term (current) drug therapy: Secondary | ICD-10-CM | POA: Insufficient documentation

## 2019-08-12 MED ORDER — CETIRIZINE HCL 10 MG PO TABS: 10.0000 mg | ORAL_TABLET | Freq: Every day | ORAL | 0 refills | Status: DC

## 2019-08-12 MED ORDER — BENZONATATE 100 MG PO CAPS: 100.0000 mg | ORAL_CAPSULE | Freq: Three times a day (TID) | ORAL | 0 refills | Status: DC

## 2019-08-12 NOTE — ED Provider Notes (Signed)
[facility]    CSN: [id number] Arrival date & time: 08/12/19  2353      History   Chief Complaint Chief Complaint  Patient presents with  . Eye Problem  . Cough    HPI Erica Mack is a 33 y.o. female.   Patient is a 33 year old female past medical history of asthma, chlamydia, gonorrhea, headache, hypertension.  She presents today with chronic, nonproductive cough x1 week.  Symptoms have been constant.  History of asthma and bronchitis.  She has been using her inhaler with some relief.  She also has a history of allergies.  Is not currently taking any allergy medicine.  The cough is worse at night.  She is also complaining of right eye tearing for approximately 6 months.  Was previously given some Restasis for dry eyes.  This does not seem to help.  She also wears false eyelashes.  She has not worn his in 2 weeks.  No eye redness, itching or irritation.  Mild lid swelling.  No fever, chills, body aches, nasal congestion, rhinorrhea, headache.  No foreign body in the eye.   ROS per HPI      Past Medical History:  Diagnosis Date  . Asthma   . Chlamydia contact, treated   . Gonorrhea   . Headache(784.0)   . Hypertension    no meds; dx 2008    Patient Active Problem List   Diagnosis Date Noted  . History of migraine 07/10/2016    Past Surgical History:  Procedure Laterality Date  . DILATION AND CURETTAGE OF UTERUS    . INDUCED ABORTION      OB History    Gravida  5   Para  3   Term  3   Preterm      AB  2   Living  3     SAB      TAB  2   Ectopic      Multiple  0   Live Births  3            Home Medications    Prior to Admission medications   Medication Sig Start Date End Date Taking? Authorizing Provider  benzonatate (TESSALON) 100 MG capsule Take 1 capsule (100 mg total) by mouth every 8 (eight) hours. 08/12/19   Dahlia Byes A, NP  cetirizine (ZYRTEC) 10 MG tablet Take 1 tablet (10 mg total) by mouth daily. 08/12/19    Jacynda Brunke, Gloris Manchester A, NP  triamterene-hydrochlorothiazide (DYAZIDE) 37.5-25 MG capsule Take 1 each (1 capsule total) by mouth daily. 12/06/18   Brock Bad, MD  albuterol (PROVENTIL HFA;VENTOLIN HFA) 108 (90 Base) MCG/ACT inhaler Inhale 1-2 puffs into the lungs every 6 (six) hours as needed for wheezing or shortness of breath. Patient not taking: Reported on 12/06/2018 11/06/16 08/12/19  Brock Bad, MD  medroxyPROGESTERone (DEPO-PROVERA) 150 MG/ML injection Inject 1 mL (150 mg total) into the muscle every 3 (three) months. Bring to office for administration. Patient not taking: Reported on 02/24/2019 12/06/18 08/12/19  Brock Bad, MD    Family History Family History  Problem Relation Age of Onset  . Hypertension Mother   . Diabetes Brother   . Hypertension Maternal Aunt   . Heart disease Maternal Aunt   . Cancer Maternal Grandmother   . Hypertension Maternal Grandmother   . Anesthesia problems Neg Hx     Social History Social History   Tobacco Use  . Smoking status: Never Smoker  . Smokeless  tobacco: Never Used  Substance Use Topics  . Alcohol use: Yes    Alcohol/week: 6.0 standard drinks    Types: 3 Shots of liquor, 3 Standard drinks or equivalent per week    Comment: occasional alcohol  . Drug use: No    Types: Marijuana    Comment: prior use     Allergies   Aspirin   Review of Systems Review of Systems   Physical Exam Triage Vital Signs ED Triage Vitals [08/12/19 0904]  Enc Vitals Group     BP 132/83     Pulse Rate 79     Resp 17     Temp 98.3 F (36.8 C)     Temp Source Oral     SpO2 100 %     Weight      Height      Head Circumference      Peak Flow      Pain Score 4     Pain Loc      Pain Edu?      Excl. in [place]?    No data found.  Updated Vital Signs BP 132/83 (BP Location: Left Arm)   Pulse 79   Temp 98.3 F (36.8 C) (Oral)   Resp 17   SpO2 100%   Visual Acuity Right Eye Distance:   Left Eye Distance:   Bilateral Distance:     Right Eye Near:   Left Eye Near:    Bilateral Near:     Physical Exam Vitals signs and nursing note reviewed.  Constitutional:      General: She is not in acute distress.    Appearance: Normal appearance. She is normal weight. She is not ill-appearing, toxic-appearing or diaphoretic.  HENT:     Head: Normocephalic and atraumatic.     Right Ear: Tympanic membrane and ear canal normal.     Left Ear: Tympanic membrane and ear canal normal.     Nose: Nose normal.     Mouth/Throat:     Pharynx: Oropharynx is clear.  Eyes:     General:        Right eye: Discharge present.     Comments: Tearing  from right eye Sclera white Mild lid swelling  Neurological:     Mental Status: She is alert.      UC Treatments / Results  Labs (all labs ordered are listed, but only abnormal results are displayed) Labs Reviewed  NOVEL CORONAVIRUS, NAA (HOSP ORDER, SEND-OUT TO REF LAB; TAT 18-24 HRS)    EKG   Radiology No results found.  Procedures Procedures (including critical care time)  Medications Ordered in UC Medications - No data to display  Initial Impression / Assessment and Plan / UC Course  I have reviewed the triage vital signs and the nursing notes.  Pertinent labs & imaging results that were available during my care of the patient were reviewed by me and considered in my medical decision making (see chart for details).     Cough-most likely allergy related Lungs clear  We will have her start Zyrtec daily. Tessalon Perles for cough.  She can use her albuterol inhaler as needed.  Right eye tearing-recommend to stop the Restasis since this is not helping.  Also recommended to continue discontinuing the false eyelashes. Not likely this is conjunctivitis based on amount of time. Could be a blocked tear duct or allergy related.  The Zyrtec should help with this. Recommended that the eye problem continues she can follow-up with  eye specialist  Patient understanding and agreed  Final Clinical Impressions(s) / UC Diagnoses   Final diagnoses:  Cough  Watering of right eye     Discharge Instructions     Believe your symptoms may be allergy related.  We will start you on Zyrtec daily. You can use the [employer] Perles as needed for cough Stop using the eyedrops and continue to stop using the false eyelashes. If the eye problem continues you need to see an eye specialist.    ED Prescriptions    Medication Sig Dispense Auth. Provider   cetirizine (ZYRTEC) 10 MG tablet Take 1 tablet (10 mg total) by mouth daily. 30 tablet Glorianna Gott A, NP   benzonatate (TESSALON) 100 MG capsule Take 1 capsule (100 mg total) by mouth every 8 (eight) hours. 21 capsule Jarius Dieudonne A, NP     PDMP not reviewed this encounter.   Janace ArisBast, Allaya Abbasi A, NP 08/12/19 1035

## 2019-08-12 NOTE — Discharge Instructions (Signed)
Believe your symptoms may be allergy related.  We will start you on Zyrtec daily. You can use the [employer] as needed for cough Stop using the eyedrops and continue to stop using the false eyelashes. If the eye problem continues you need to see an eye specialist.

## 2019-08-12 NOTE — ED Triage Notes (Signed)
Pt presents with chronic non productive cough for over a week.  Pt has Hx of bronchitis and asthma.

## 2019-08-14 LAB — NOVEL CORONAVIRUS, NAA (HOSP ORDER, SEND-OUT TO REF LAB; TAT 18-24 HRS): SARS-CoV-2, NAA: NOT DETECTED

## 2019-11-28 ENCOUNTER — Other Ambulatory Visit: Payer: Self-pay | Admitting: Obstetrics

## 2019-11-28 ENCOUNTER — Encounter: Payer: Self-pay | Admitting: Obstetrics

## 2019-11-28 ENCOUNTER — Ambulatory Visit: Payer: Medicaid Other | Admitting: Obstetrics

## 2019-11-28 ENCOUNTER — Other Ambulatory Visit: Payer: Self-pay

## 2019-11-28 VITALS — BP 134/77 | HR 98 | Ht 66.0 in | Wt 217.0 lb

## 2019-11-28 DIAGNOSIS — I10 Essential (primary) hypertension: Secondary | ICD-10-CM | POA: Diagnosis not present

## 2019-11-28 DIAGNOSIS — N644 Mastodynia: Secondary | ICD-10-CM | POA: Diagnosis not present

## 2019-11-28 MED ORDER — CETIRIZINE HCL 10 MG PO TABS: 10.0000 mg | ORAL_TABLET | Freq: Every day | ORAL | 11 refills | Status: DC

## 2019-11-28 MED ORDER — TRIAMTERENE-HCTZ 37.5-25 MG PO CAPS: 1.0000 | ORAL_CAPSULE | Freq: Every day | ORAL | 11 refills | Status: DC

## 2019-11-28 NOTE — Progress Notes (Signed)
Patient presents for breast pain and tenderness bilaterally. Sx started about 3 weeks ago.

## 2019-11-28 NOTE — Progress Notes (Signed)
Patient ID: Erica Mack, female   DOB: 01-Aug-1986, 34 y.o.   MRN: [id number]  Chief Complaint  Patient presents with  . Breast Pain    HPI Erica Mack is a 34 y.o. female.  Complains of breast and nipple tenderness for the past 2 months.  Denies feeling any masses or having nipple discharge.  She had a Nexplanon placed in June 2020. HPI  Past Medical History:  Diagnosis Date  . Asthma   . Chlamydia contact, treated   . Gonorrhea   . Headache(784.0)   . Hypertension    no meds; dx 2008    Past Surgical History:  Procedure Laterality Date  . DILATION AND CURETTAGE OF UTERUS    . INDUCED ABORTION      Family History  Problem Relation Age of Onset  . Hypertension Mother   . Diabetes Brother   . Hypertension Maternal Aunt   . Heart disease Maternal Aunt   . Cancer Maternal Grandmother   . Hypertension Maternal Grandmother   . Anesthesia problems Neg Hx     Social History Social History   Tobacco Use  . Smoking status: Never Smoker  . Smokeless tobacco: Never Used  Substance Use Topics  . Alcohol use: Yes    Alcohol/week: 6.0 standard drinks    Types: 3 Shots of liquor, 3 Standard drinks or equivalent per week    Comment: occasional alcohol  . Drug use: No    Types: Marijuana    Comment: prior use    Allergies  Allergen Reactions  . Aspirin Itching    Current Outpatient Medications  Medication Sig Dispense Refill  . cetirizine (ZYRTEC) 10 MG tablet Take 1 tablet (10 mg total) by mouth daily. 30 tablet 11  . triamterene-hydrochlorothiazide (DYAZIDE) 37.5-25 MG capsule Take 1 each (1 capsule total) by mouth daily. 30 capsule 11   Current Facility-Administered Medications  Medication Dose Route Frequency Provider Last Rate Last Admin  . etonogestrel (NEXPLANON) implant 68 mg  68 mg Subdermal Once Conan Bowens, MD        Review of Systems Review of Systems Constitutional: negative for fatigue and weight loss Respiratory: negative for cough and  wheezing Cardiovascular: negative for chest pain, fatigue and palpitations Gastrointestinal: negative for abdominal pain and change in bowel habits Genitourinary:negative Integument/breast: positive for breast tenderness.  negative for nipple discharge Musculoskeletal:negative for myalgias Neurological: negative for gait problems and tremors Behavioral/Psych: negative for abusive relationship, depression Endocrine: negative for temperature intolerance      Blood pressure 134/77, pulse 98, height 5\' 6"  (1.676 m), weight 217 lb (98.4 kg).  Physical Exam Physical Exam General:   alert and no distress  Skin:   no rash or abnormalities  Lungs:   clear to auscultation bilaterally  Heart:   regular rate and rhythm, S1, S2 normal, no murmur, click, rub or gallop  Breasts:   normal without suspicious masses, skin or nipple changes or axillary nodes    50% of 15 min visit spent on counseling and coordination of care.   Data Reviewed Labs  Assessment     1. Breast tenderness in female Rx: - MM DIAG BREAST TOMO BILATERAL; Future  2. Hypertension, unspecified type Rx: - triamterene-hydrochlorothiazide (DYAZIDE) 37.5-25 MG capsule; Take 1 each (1 capsule total) by mouth daily.  Dispense: 30 capsule; Refill: 11  3. HTN (hypertension), benign Rx: - cetirizine (ZYRTEC) 10 MG tablet; Take 1 tablet (10 mg total) by mouth daily.  Dispense: 30 tablet; Refill: 11  Plan   Follow up in 4 months for Annual / Pap  Orders Placed This Encounter  Procedures  . MM DIAG BREAST TOMO BILATERAL    Ins-mcd/not family planning Pf-base No needs/no hx br ca/no implants or br red ec/office    Standing Status:   Future    Standing Expiration Date:   01/27/2021    Order Specific Question:   Reason for Exam (SYMPTOM  OR DIAGNOSIS REQUIRED)    Answer:   Breast tenderness    Order Specific Question:   Is the patient pregnant?    Answer:   No    Order Specific Question:   Preferred imaging location?     Answer:   [facility]   Meds ordered this encounter  Medications  . triamterene-hydrochlorothiazide (DYAZIDE) 37.5-25 MG capsule    Sig: Take 1 each (1 capsule total) by mouth daily.    Dispense:  30 capsule    Refill:  11  . cetirizine (ZYRTEC) 10 MG tablet    Sig: Take 1 tablet (10 mg total) by mouth daily.    Dispense:  30 tablet    Refill:  11     Shelly Bombard, MD 11/28/2019

## 2019-12-03 ENCOUNTER — Telehealth: Payer: Self-pay

## 2019-12-03 NOTE — Telephone Encounter (Signed)
Returned call and pt stated she in office on Friday with a lot of breast pain. Pt reports that she has nexplanon and her cycle came on in the last week of February and today she has started bleeding heavy again, advised I would route concerns to provider.

## 2019-12-03 NOTE — Telephone Encounter (Signed)
Continue to monitor cycle, and if bleeding is still heavy over the weekend call the office on Monday.  This bleeding should resolve spontaneously over the next few days.

## 2019-12-03 NOTE — Telephone Encounter (Signed)
Called pt and informed of provider's instructions as follows... Continue to monitor cycle, and if bleeding is still heavy over the weekend call the office on Monday.  This bleeding should resolve spontaneously over the next few days. Pt agreed.

## 2019-12-12 ENCOUNTER — Telehealth: Payer: Medicaid Other | Admitting: Obstetrics

## 2019-12-18 ENCOUNTER — Ambulatory Visit: Payer: Medicaid Other | Admitting: Obstetrics

## 2019-12-25 ENCOUNTER — Other Ambulatory Visit: Payer: Self-pay | Admitting: Obstetrics

## 2019-12-29 ENCOUNTER — Other Ambulatory Visit: Payer: Medicaid Other

## 2020-01-05 ENCOUNTER — Encounter (HOSPITAL_COMMUNITY): Payer: Self-pay | Admitting: Emergency Medicine

## 2020-01-05 ENCOUNTER — Emergency Department (HOSPITAL_COMMUNITY)
Admission: EM | Admit: 2020-01-05 | Discharge: 2020-01-06 | Disposition: A | Discharge status: Home / Self Care | Payer: Medicaid Other | Attending: Emergency Medicine | Admitting: Emergency Medicine

## 2020-01-05 ENCOUNTER — Other Ambulatory Visit: Payer: Self-pay

## 2020-01-05 DIAGNOSIS — I10 Essential (primary) hypertension: Secondary | ICD-10-CM | POA: Diagnosis not present

## 2020-01-05 DIAGNOSIS — H5789 Other specified disorders of eye and adnexa: Secondary | ICD-10-CM | POA: Insufficient documentation

## 2020-01-05 DIAGNOSIS — Z79899 Other long term (current) drug therapy: Secondary | ICD-10-CM | POA: Insufficient documentation

## 2020-01-05 DIAGNOSIS — H5711 Ocular pain, right eye: Secondary | ICD-10-CM | POA: Diagnosis present

## 2020-01-05 DIAGNOSIS — F1210 Cannabis abuse, uncomplicated: Secondary | ICD-10-CM | POA: Diagnosis not present

## 2020-01-05 MED ORDER — FLUORESCEIN SODIUM 1 MG OP STRP
1.0000 | ORAL_STRIP | Freq: Once | OPHTHALMIC | Status: AC
  Administered 2020-01-05: 1 via OPHTHALMIC
  Filled 2020-01-05: qty 1

## 2020-01-05 MED ORDER — TETRACAINE HCL 0.5 % OP SOLN
2.0000 [drp] | Freq: Once | OPHTHALMIC | Status: AC
  Administered 2020-01-05: 2 [drp] via OPHTHALMIC
  Filled 2020-01-05: qty 4

## 2020-01-05 MED ORDER — POLYMYXIN B-TRIMETHOPRIM 10000-0.1 UNIT/ML-% OP SOLN: 1.0000 [drp] | OPHTHALMIC | 0 refills | Status: DC

## 2020-01-05 NOTE — ED Notes (Signed)
Patient states that she normally wears glasses but does not have them with her tonight.

## 2020-01-05 NOTE — ED Triage Notes (Signed)
Pt states her right eye keeps draining and throbbing, pt states this has been going on for several months. Pt states shes been seen for this and prescribed eye drops and it hasnt worked.

## 2020-01-05 NOTE — ED Provider Notes (Signed)
[facility] EMERGENCY DEPARTMENT Provider Note   CSN: [id number] Arrival date & time: 01/05/20  2005     History Chief Complaint  Patient presents with  . Eye Pain    Erica Mack is a 34 y.o. female.  HPI     This is a 34 year old female with a history of hypertension who presents with right eye tearing.  Patient reports for the last several months she has had increased tearing of the right eye.  She has seen an ophthalmologist, Dr. Katy Fitch and was told that she had chronic dry.  She was given drops that "are not working."  She states that she continues to have drainage of the corner of the right eye.  She has also noted some eye pain and pressure.  She at times has blurry vision and describes "a film over her eye."  She denies any fever or upper respiratory symptoms.  Symptoms are always related to her right eye.  She has not followed up with her ophthalmologist.  Past Medical History:  Diagnosis Date  . Asthma   . Chlamydia contact, treated   . Gonorrhea   . Headache(784.0)   . Hypertension    no meds; dx 2008    Patient Active Problem List   Diagnosis Date Noted  . History of migraine 07/10/2016    Past Surgical History:  Procedure Laterality Date  . DILATION AND CURETTAGE OF UTERUS    . INDUCED ABORTION       OB History    Gravida  5   Para  3   Term  3   Preterm      AB  2   Living  3     SAB      TAB  2   Ectopic      Multiple  0   Live Births  3           Family History  Problem Relation Age of Onset  . Hypertension Mother   . Diabetes Brother   . Hypertension Maternal Aunt   . Heart disease Maternal Aunt   . Cancer Maternal Grandmother   . Hypertension Maternal Grandmother   . Anesthesia problems Neg Hx     Social History   Tobacco Use  . Smoking status: Never Smoker  . Smokeless tobacco: Never Used  Substance Use Topics  . Alcohol use: Yes    Alcohol/week: 6.0 standard drinks    Types: 3 Shots of liquor, 3 Standard  drinks or equivalent per week    Comment: occasional alcohol  . Drug use: No    Types: Marijuana    Comment: prior use    Home Medications Prior to Admission medications   Medication Sig Start Date End Date Taking? Authorizing Provider  Carboxymethylcellulose Sodium (EYE DROPS OP) Apply 1 drop to eye in the morning and at bedtime. For dry eye relief   Yes [provider]  cetirizine (ZYRTEC) 10 MG tablet Take 1 tablet (10 mg total) by mouth daily. 11/28/19  Yes Shelly Bombard, MD  triamterene-hydrochlorothiazide (DYAZIDE) 37.5-25 MG capsule Take 1 each (1 capsule total) by mouth daily. 11/28/19  Yes Shelly Bombard, MD  trimethoprim-polymyxin b (POLYTRIM) ophthalmic solution Place 1 drop into the right eye every 4 (four) hours. 01/05/20   Kacy Conely, Barbette Hair, MD  albuterol (PROVENTIL HFA;VENTOLIN HFA) 108 (90 Base) MCG/ACT inhaler Inhale 1-2 puffs into the lungs every 6 (six) hours as needed for wheezing or shortness of breath. Patient not  taking: Reported on 12/06/2018 11/06/16 08/12/19  Brock Bad, MD  medroxyPROGESTERone (DEPO-PROVERA) 150 MG/ML injection Inject 1 mL (150 mg total) into the muscle every 3 (three) months. Bring to office for administration. Patient not taking: Reported on 02/24/2019 12/06/18 08/12/19  Brock Bad, MD    Allergies    Aspirin  Review of Systems   Review of Systems  Constitutional: Negative for fever.  Eyes: Positive for pain, discharge and visual disturbance. Negative for photophobia, redness and itching.  All other systems reviewed and are negative.   Physical Exam Updated Vital Signs BP 134/76 (BP Location: Left Arm)   Pulse 79   Temp 99.4 F (37.4 C) (Oral)   Resp 14   Ht 1.676 m (5\' 6" )   Wt 88.9 kg   SpO2 100%   BMI 31.64 kg/m   Physical Exam Vitals and nursing note reviewed.  Constitutional:      Appearance: She is well-developed. She is obese. She is not ill-appearing.  HENT:     Head: Normocephalic and  atraumatic.  Eyes:     General: Lids are normal. Lids are everted, no foreign bodies appreciated. Vision grossly intact. No allergic shiner or visual field deficit.       Right eye: Discharge present. No foreign body.        Left eye: No foreign body or discharge.     Intraocular pressure: Right eye pressure is 25 mmHg. Left eye pressure is 24 mmHg. Measurements were taken using an automated tonometer.    Extraocular Movements: Extraocular movements intact.     Conjunctiva/sclera: Conjunctivae normal.     Pupils: Pupils are equal, round, and reactive to light.     Comments: No fluorescein uptake  Cardiovascular:     Rate and Rhythm: Normal rate and regular rhythm.  Pulmonary:     Effort: Pulmonary effort is normal. No respiratory distress.  Abdominal:     Palpations: Abdomen is soft.  Musculoskeletal:     Cervical back: Neck supple.  Skin:    General: Skin is warm and dry.  Neurological:     Mental Status: She is alert and oriented to person, place, and time.  Psychiatric:        Mood and Affect: Mood normal.     ED Results / Procedures / Treatments   Labs (all labs ordered are listed, but only abnormal results are displayed) Labs Reviewed - No data to display  EKG None  Radiology No results found.  Procedures Procedures (including critical care time)  Medications Ordered in ED Medications  tetracaine (PONTOCAINE) 0.5 % ophthalmic solution 2 drop (2 drops Right Eye Given by Other 01/05/20 2342)  fluorescein ophthalmic strip 1 strip (1 strip Right Eye Given 01/05/20 2342)    ED Course  I have reviewed the triage vital signs and the nursing notes.  Pertinent labs & imaging results that were available during my care of the patient were reviewed by me and considered in my medical decision making (see chart for details).    MDM Rules/Calculators/A&P                       Patient presents with acute on chronic right eye drainage.  Mostly unchanged from prior.  States  that her drops are not working.  She has a bridal shower on Saturday and would like to get this taken care of.  I discussed with her that given the chronicity of her symptoms, I was unlikely to  find a cause.  However, I did evaluate.  She has normal eye pressures.  No obvious corneal abrasion.  No obvious conjunctivitis or injection.  I have encouraged her to follow-up with ophthalmology.  She states "I need something."  Given that she has chronic drainage, I gave her Polytrim drops to rule out any subclinical bacterial infection.  She was referred to ophthalmology if she does not choose to follow-up with Dr. Dione Booze.  After history, exam, and medical workup I feel the patient has been appropriately medically screened and is safe for discharge home. Pertinent diagnoses were discussed with the patient. Patient was given return precautions.   Final Clinical Impression(s) / ED Diagnoses Final diagnoses:  Eye drainage    Rx / DC Orders ED Discharge Orders         Ordered    trimethoprim-polymyxin b (POLYTRIM) ophthalmic solution  Every 4 hours     01/05/20 2353           Shon Baton, MD 01/05/20 2358

## 2020-01-05 NOTE — Discharge Instructions (Addendum)
You were seen today for drainage of the eye.  There does not appear to be infection.  Your eye exam today is reassuring.  Follow-up with an ophthalmologist.  If you do not wish to follow-up with your ophthalmologist, you may follow-up with Dr. Sherrine Maples.  You will be given an antibiotic drop to cover for any possible subclinical infection.

## 2020-01-22 DIAGNOSIS — H04201 Unspecified epiphora, right lacrimal gland: Secondary | ICD-10-CM | POA: Diagnosis not present

## 2020-01-22 DIAGNOSIS — H16223 Keratoconjunctivitis sicca, not specified as Sjogren's, bilateral: Secondary | ICD-10-CM | POA: Diagnosis not present

## 2020-01-26 ENCOUNTER — Other Ambulatory Visit: Payer: Self-pay

## 2020-01-26 ENCOUNTER — Encounter (HOSPITAL_COMMUNITY): Payer: Self-pay

## 2020-01-26 ENCOUNTER — Emergency Department (HOSPITAL_COMMUNITY)
Admission: EM | Admit: 2020-01-26 | Discharge: 2020-01-26 | Disposition: A | Discharge status: Home / Self Care | Payer: Medicaid Other | Attending: Emergency Medicine | Admitting: Emergency Medicine

## 2020-01-26 ENCOUNTER — Ambulatory Visit (HOSPITAL_COMMUNITY)
Admission: EM | Admit: 2020-01-26 | Discharge: 2020-01-26 | Disposition: A | Discharge status: Home / Self Care | Payer: Medicaid Other | Attending: Family Medicine | Admitting: Family Medicine

## 2020-01-26 DIAGNOSIS — H0489 Other disorders of lacrimal system: Secondary | ICD-10-CM | POA: Insufficient documentation

## 2020-01-26 DIAGNOSIS — R519 Headache, unspecified: Secondary | ICD-10-CM | POA: Diagnosis not present

## 2020-01-26 DIAGNOSIS — H04551 Acquired stenosis of right nasolacrimal duct: Secondary | ICD-10-CM

## 2020-01-26 LAB — BASIC METABOLIC PANEL
Anion gap: 8 (ref 5–15)
BUN: 10 mg/dL (ref 6–20)
CO2: 24 mmol/L (ref 22–32)
Calcium: 8.9 mg/dL (ref 8.9–10.3)
Chloride: 106 mmol/L (ref 98–111)
Creatinine, Ser: 0.84 mg/dL (ref 0.44–1.00)
GFR calc Af Amer: >60 mL/min (ref >60)
GFR calc non Af Amer: >60 mL/min (ref >60)
Glucose, Bld: 95 mg/dL (ref 70–99)
Potassium: 3.9 mmol/L (ref 3.5–5.1)
Sodium: 138 mmol/L (ref 135–145)

## 2020-01-26 LAB — CBC WITH DIFFERENTIAL/PLATELET
Abs Immature Granulocytes: 0.04 10*3/uL (ref 0.00–0.07)
Basophils Absolute: 0 10*3/uL (ref 0.0–0.1)
Basophils Relative: 0 %
Eosinophils Absolute: 0 10*3/uL (ref 0.0–0.5)
Eosinophils Relative: 0 %
HCT: 39.5 % (ref 36.0–46.0)
Hemoglobin: 12.6 g/dL (ref 12.0–15.0)
Immature Granulocytes: 0 %
Lymphocytes Relative: 26 %
Lymphs Abs: 3.1 10*3/uL (ref 0.7–4.0)
MCH: 28.6 pg (ref 26.0–34.0)
MCHC: 31.9 g/dL (ref 30.0–36.0)
MCV: 89.6 fL (ref 80.0–100.0)
Monocytes Absolute: 0.7 10*3/uL (ref 0.1–1.0)
Monocytes Relative: 6 %
Neutro Abs: 8 10*3/uL — ABNORMAL HIGH (ref 1.7–7.7)
Neutrophils Relative %: 68 %
Platelets: 322 10*3/uL (ref 150–400)
RBC: 4.41 MIL/uL (ref 3.87–5.11)
RDW: 13.2 % (ref 11.5–15.5)
WBC: 11.9 10*3/uL — ABNORMAL HIGH (ref 4.0–10.5)
nRBC: 0 % (ref 0.0–0.2)

## 2020-01-26 MED ORDER — ERYTHROMYCIN 5 MG/GM OP OINT: 1.0000 "application " | TOPICAL_OINTMENT | Freq: Three times a day (TID) | OPHTHALMIC | 0 refills | Status: AC

## 2020-01-26 NOTE — ED Notes (Signed)
Patient verbalizes understanding of discharge instructions. Opportunity for questioning and answers were provided. Armband removed by staff, pt discharged from ED.  

## 2020-01-26 NOTE — ED Provider Notes (Signed)
[place]    CSN: [id number] Arrival date & time: 01/26/20  0102      History   Chief Complaint Chief Complaint  Patient presents with  . eye pain    HPI Erica Mack is a 34 y.o. female.   She is presenting with right eye pain.  She is having ongoing tearing and pain over the medial canthus.  She denies any trauma to the eye.  She has been seen by an ophthalmologist who recommended her to the plastics ophthalmologist.  She has been able to get in to be seen.  She denies any vision changes.  She has tried Polytrim.  Symptoms are ongoing and seem to be getting worse.  HPI  Past Medical History:  Diagnosis Date  . Asthma   . Chlamydia contact, treated   . Gonorrhea   . Headache(784.0)   . Hypertension    no meds; dx 2008    Patient Active Problem List   Diagnosis Date Noted  . History of migraine 07/10/2016    Past Surgical History:  Procedure Laterality Date  . DILATION AND CURETTAGE OF UTERUS    . INDUCED ABORTION      OB History    Gravida  5   Para  3   Term  3   Preterm      AB  2   Living  3     SAB      TAB  2   Ectopic      Multiple  0   Live Births  3            Home Medications    Prior to Admission medications   Medication Sig Start Date End Date Taking? Authorizing Provider  Carboxymethylcellulose Sodium (EYE DROPS OP) Apply 1 drop to eye in the morning and at bedtime. For dry eye relief    [provider]  cetirizine (ZYRTEC) 10 MG tablet Take 1 tablet (10 mg total) by mouth daily. 11/28/19   Shelly Bombard, MD  erythromycin ophthalmic ointment Place 1 application into the right eye 3 (three) times daily for 5 days. Apply 1 inch ribbon to affected eye TID for 5 days. 01/26/20 01/31/20  Rosemarie Ax, MD  triamterene-hydrochlorothiazide (DYAZIDE) 37.5-25 MG capsule Take 1 each (1 capsule total) by mouth daily. 11/28/19   Shelly Bombard, MD  trimethoprim-polymyxin b Mayra Neer) ophthalmic solution Place  1 drop into the right eye every 4 (four) hours. 01/05/20   Horton, Barbette Hair, MD  albuterol (PROVENTIL HFA;VENTOLIN HFA) 108 (90 Base) MCG/ACT inhaler Inhale 1-2 puffs into the lungs every 6 (six) hours as needed for wheezing or shortness of breath. Patient not taking: Reported on 12/06/2018 11/06/16 08/12/19  Shelly Bombard, MD  medroxyPROGESTERone (DEPO-PROVERA) 150 MG/ML injection Inject 1 mL (150 mg total) into the muscle every 3 (three) months. Bring to office for administration. Patient not taking: Reported on 02/24/2019 12/06/18 08/12/19  Shelly Bombard, MD    Family History Family History  Problem Relation Age of Onset  . Hypertension Mother   . Diabetes Brother   . Hypertension Maternal Aunt   . Heart disease Maternal Aunt   . Cancer Maternal Grandmother   . Hypertension Maternal Grandmother   . Anesthesia problems Neg Hx     Social History Social History   Tobacco Use  . Smoking status: Never Smoker  . Smokeless tobacco: Never Used  Substance Use Topics  . Alcohol use: Yes  Comment: occasional alcohol  . Drug use: No    Types: Marijuana    Comment: prior use     Allergies   Aspirin   Review of Systems Review of Systems  See HPI  Physical Exam Triage Vital Signs ED Triage Vitals  Enc Vitals Group     BP 01/26/20 1835 134/84     Pulse Rate 01/26/20 1835 78     Resp 01/26/20 1835 16     Temp 01/26/20 1835 98.2 F (36.8 C)     Temp Source 01/26/20 1835 Oral     SpO2 01/26/20 1835 100 %     Weight 01/26/20 1838 216 lb (98 kg)     Height 01/26/20 1838 5\' 6"  (1.676 m)     Head Circumference --      Peak Flow --      Pain Score 01/26/20 1838 10     Pain Loc --      Pain Edu? --      Excl. in GC? --    No data found.  Updated Vital Signs BP 134/84   Pulse 78   Temp 98.2 F (36.8 C) (Oral)   Resp 16   Ht 5\' 6"  (1.676 m)   Wt 98 kg   SpO2 100%   BMI 34.86 kg/m   Visual Acuity Right Eye Distance: 20/40(Without correction.) Left Eye  Distance: 20/30(Without correction.) Bilateral Distance: 20/30(Without correction.)  Right Eye Near:   Left Eye Near:    Bilateral Near:     Physical Exam Gen: NAD, alert, cooperative with exam, well-appearing ENT: normal lips, normal nasal mucosa,  Eye: normal EOM, normal conjunctiva and lids, serous discharge, no streaking or redness over the medial canthus, tenderness to palpation of the medial canthus, no purulent discharge, pupils are equal and reactive CV:  no edema, Resp: no accessory muscle use, non-labored,     UC Treatments / Results  Labs (all labs ordered are listed, but only abnormal results are displayed) Labs Reviewed - No data to display  EKG   Radiology No results found.  Procedures Procedures (including critical care time)  Medications Ordered in UC Medications - No data to display  Initial Impression / Assessment and Plan / UC Course  I have reviewed the triage vital signs and the nursing notes.  Pertinent labs & imaging results that were available during my care of the patient were reviewed by me and considered in my medical decision making (see chart for details).     Erica Mack is a 34 year old female that is presenting with dacryostenosis of the right nasolacrimal duct.  Having ongoing pain.  Spoke with Dr. Casper Harrison the on-call ophthalmologist.  He recommended eyedrops that can help.  Given indications to seek immediate care if she develops purulent discharge or pain becomes worse or has change in her vision.  Counseled supportive care.  Given indications follow-up return.  Final Clinical Impressions(s) / UC Diagnoses   Final diagnoses:  Dacryostenosis of right nasolacrimal duct     Discharge Instructions     Please try warm compression.  Please be seen in the emergency department if you have pus coming out of your eye or you develop red eyes or changes in your vision.     ED Prescriptions    Medication Sig Dispense Auth. Provider    erythromycin ophthalmic ointment Place 1 application into the right eye 3 (three) times daily for 5 days. Apply 1 inch ribbon to affected eye TID for 5 days. 3.5  g Myra Rude, MD     PDMP not reviewed this encounter.   Myra Rude, MD 01/26/20 Barry Brunner

## 2020-01-26 NOTE — Discharge Instructions (Signed)
Use the eye antibiotic ointment that the urgent care prescribed to you . Call ophthalmology clinic for appointment. Apply warm compresses to eye.

## 2020-01-26 NOTE — Discharge Instructions (Signed)
Please try warm compression.  Please be seen in the emergency department if you have pus coming out of your eye or you develop red eyes or changes in your vision.

## 2020-01-26 NOTE — ED Triage Notes (Signed)
Pt c/o 10/10 throbbing right eye pain. An eye dr told pt she has a clooged tear duct. Pt's eye keeps watering. Pt states she has tried to go to eye drs, but they say they can't do anything for her and s he hasn't been able to get in with a specialized opthamologist.

## 2020-01-26 NOTE — ED Provider Notes (Signed)
[facility] [place] EMERGENCY DEPARTMENT Provider Note   CSN: [id number] Arrival date & time: 01/26/20  1943     History Chief Complaint  Patient presents with  . Facial Pain    [place] is a 34 y.o. female.  34 year old female with past medical history below who presents with right facial pain.  Patient reports problems with her right eye tear duct for the past 1 year.  Her problems and pain have gotten worse over the past 2 months.  She reports frequent tearing of her eye, pain under her eye and on her cheek, and pain down into her right face and her teeth.  She has seen an eye doctor and was told that she needed to see an ophthalmologist because of the clogged tear duct but she has not been able to get the clinic to answer the phone. No vision changes. No URI sx or fevers.  The history is provided by the patient and medical records.       Past Medical History:  Diagnosis Date  . Asthma   . Chlamydia contact, treated   . Gonorrhea   . Headache(784.0)   . Hypertension    no meds; dx 2008    Patient Active Problem List   Diagnosis Date Noted  . History of migraine 07/10/2016    Past Surgical History:  Procedure Laterality Date  . DILATION AND CURETTAGE OF UTERUS    . INDUCED ABORTION       OB History    Gravida  5   Para  3   Term  3   Preterm      AB  2   Living  3     SAB      TAB  2   Ectopic      Multiple  0   Live Births  3           Family History  Problem Relation Age of Onset  . Hypertension Mother   . Diabetes Brother   . Hypertension Maternal Aunt   . Heart disease Maternal Aunt   . Cancer Maternal Grandmother   . Hypertension Maternal Grandmother   . Anesthesia problems Neg Hx     Social History   Tobacco Use  . Smoking status: Never Smoker  . Smokeless tobacco: Never Used  Substance Use Topics  . Alcohol use: Yes    Comment: occasional alcohol  . Drug use: No    Types: Marijuana    Comment:  prior use    Home Medications Prior to Admission medications   Medication Sig Start Date End Date Taking? Authorizing Provider  Carboxymethylcellulose Sodium (EYE DROPS OP) Apply 1 drop to eye in the morning and at bedtime. For dry eye relief    [provider]  cetirizine (ZYRTEC) 10 MG tablet Take 1 tablet (10 mg total) by mouth daily. 11/28/19   Shelly Bombard, MD  erythromycin ophthalmic ointment Place 1 application into the right eye 3 (three) times daily for 5 days. Apply 1 inch ribbon to affected eye TID for 5 days. 01/26/20 01/31/20  Rosemarie Ax, MD  triamterene-hydrochlorothiazide (DYAZIDE) 37.5-25 MG capsule Take 1 each (1 capsule total) by mouth daily. 11/28/19   Shelly Bombard, MD  trimethoprim-polymyxin b Mayra Neer) ophthalmic solution Place 1 drop into the right eye every 4 (four) hours. 01/05/20   Horton, Barbette Hair, MD  albuterol (PROVENTIL HFA;VENTOLIN HFA) 108 (90 Base) MCG/ACT inhaler Inhale 1-2 puffs into the lungs  every 6 (six) hours as needed for wheezing or shortness of breath. Patient not taking: Reported on 12/06/2018 11/06/16 08/12/19  Brock Bad, MD  medroxyPROGESTERone (DEPO-PROVERA) 150 MG/ML injection Inject 1 mL (150 mg total) into the muscle every 3 (three) months. Bring to office for administration. Patient not taking: Reported on 02/24/2019 12/06/18 08/12/19  Brock Bad, MD    Allergies    Aspirin  Review of Systems   Review of Systems All other systems reviewed and are negative except that which was mentioned in HPI  Physical Exam Updated Vital Signs BP 138/84 (BP Location: Right Arm)   Pulse 72   Temp 98.1 F (36.7 C) (Oral)   Resp 14   Ht 5\' 6"  (1.676 m)   Wt 98 kg   SpO2 100%   BMI 34.86 kg/m   Physical Exam Vitals and nursing note reviewed.  Constitutional:      General: She is not in acute distress.    Appearance: She is well-developed.  HENT:     Head: Normocephalic and atraumatic.  Eyes:      Conjunctiva/sclera: Conjunctivae normal.     Pupils: Pupils are equal, round, and reactive to light.     Comments: Mild clear tearing of R eye, tenderness on face just below lower eyelid and over R maxillary sinus without obvious swelling/asymmetry; no erythema  Musculoskeletal:     Cervical back: Neck supple.  Skin:    General: Skin is warm and dry.  Neurological:     Mental Status: She is alert and oriented to person, place, and time.  Psychiatric:        Judgment: Judgment normal.     ED Results / Procedures / Treatments   Labs (all labs ordered are listed, but only abnormal results are displayed) Labs Reviewed  CBC WITH DIFFERENTIAL/PLATELET  BASIC METABOLIC PANEL    EKG None  Radiology No results found.  Procedures Procedures (including critical care time)  Medications Ordered in ED Medications - No data to display  ED Course  I have reviewed the triage vital signs and the nursing notes.      MDM Rules/Calculators/A&P                      I discovered in patient's chart that she did not mention she had already been seen today for this problem at [facility]. Physician at UC had already spoken w/ ophthalmologist on call who recommended antibiotic ointment and clinic f/u.  I reiterated this plan w/ patient.   Final Clinical Impression(s) / ED Diagnoses Final diagnoses:  Facial pain  Dacryostenosis of right nasolacrimal duct    Rx / DC Orders ED Discharge Orders    None       Kue Fox, [facility], MD 01/26/20 2128

## 2020-01-26 NOTE — ED Triage Notes (Signed)
Pt arrives to ED w/ c/o R sided facial pain. Pt states that she has a clogged tear duct that she has been dealing w/ for the last year however pain has increased over the last day. Pt reports 10/10 pain.

## 2020-02-03 DIAGNOSIS — H04201 Unspecified epiphora, right lacrimal gland: Secondary | ICD-10-CM | POA: Diagnosis not present

## 2020-02-09 DIAGNOSIS — H04201 Unspecified epiphora, right lacrimal gland: Secondary | ICD-10-CM | POA: Insufficient documentation

## 2020-03-11 DIAGNOSIS — Z01812 Encounter for preprocedural laboratory examination: Secondary | ICD-10-CM | POA: Diagnosis not present

## 2020-03-11 DIAGNOSIS — Z20822 Contact with and (suspected) exposure to covid-19: Secondary | ICD-10-CM | POA: Diagnosis not present

## 2020-03-15 DIAGNOSIS — H04551 Acquired stenosis of right nasolacrimal duct: Secondary | ICD-10-CM | POA: Diagnosis not present

## 2020-03-30 DIAGNOSIS — Z48810 Encounter for surgical aftercare following surgery on the sense organs: Secondary | ICD-10-CM | POA: Diagnosis not present

## 2020-03-30 DIAGNOSIS — Z9889 Other specified postprocedural states: Secondary | ICD-10-CM | POA: Diagnosis not present

## 2020-03-30 DIAGNOSIS — H04201 Unspecified epiphora, right lacrimal gland: Secondary | ICD-10-CM | POA: Diagnosis not present

## 2020-05-03 DIAGNOSIS — Z111 Encounter for screening for respiratory tuberculosis: Secondary | ICD-10-CM | POA: Diagnosis not present

## 2020-07-22 ENCOUNTER — Ambulatory Visit (HOSPITAL_COMMUNITY)
Admission: EM | Admit: 2020-07-22 | Discharge: 2020-07-22 | Disposition: A | Discharge status: Home / Self Care | Payer: Medicaid Other | Attending: Family Medicine | Admitting: Family Medicine

## 2020-07-22 ENCOUNTER — Encounter (HOSPITAL_COMMUNITY): Payer: Self-pay | Admitting: Emergency Medicine

## 2020-07-22 ENCOUNTER — Other Ambulatory Visit: Payer: Self-pay

## 2020-07-22 DIAGNOSIS — H6981 Other specified disorders of Eustachian tube, right ear: Secondary | ICD-10-CM | POA: Diagnosis not present

## 2020-07-22 DIAGNOSIS — H6991 Unspecified Eustachian tube disorder, right ear: Secondary | ICD-10-CM

## 2020-07-22 DIAGNOSIS — I10 Essential (primary) hypertension: Secondary | ICD-10-CM

## 2020-07-22 MED ORDER — FLUTICASONE PROPIONATE 50 MCG/ACT NA SUSP: 2.0000 | Freq: Every day | NASAL | 2 refills | Status: DC

## 2020-07-22 NOTE — ED Triage Notes (Signed)
Onset of symptoms at 3 am this morning.  Patient feels pain behind right ear, ear is painful, shooting pain, denies runny nose or cough.

## 2020-07-22 NOTE — Discharge Instructions (Addendum)
Flonase daily 2 sprays in each nostril.  Zyrtec daily Continue your allergy medication that you have at home Follow up as needed for continued or worsening symptoms

## 2020-07-23 NOTE — ED Provider Notes (Signed)
[facility]    CSN: [id number] Arrival date & time: 07/22/20  2706      History   Chief Complaint Chief Complaint  Patient presents with  . Headache    HPI Erica Mack is a 34 y.o. female.   Pt is a 34 year old female that presents with right ear pain, nodule behind the right ear. Started at 3 am.  Symptoms have been constant. n other associated symptoms. No injuries.      Past Medical History:  Diagnosis Date  . Asthma   . Chlamydia contact, treated   . Gonorrhea   . Headache(784.0)   . Hypertension    no meds; dx 2008    Patient Active Problem List   Diagnosis Date Noted  . History of migraine 07/10/2016    Past Surgical History:  Procedure Laterality Date  . DILATION AND CURETTAGE OF UTERUS    . EYE SURGERY    . INDUCED ABORTION      OB History    Gravida  5   Para  3   Term  3   Preterm      AB  2   Living  3     SAB      TAB  2   Ectopic      Multiple  0   Live Births  3            Home Medications    Prior to Admission medications   Medication Sig Start Date End Date Taking? Authorizing Provider  cetirizine (ZYRTEC) 10 MG tablet Take 1 tablet (10 mg total) by mouth daily. 11/28/19  Yes Brock Bad, MD  fluticasone (FLONASE) 50 MCG/ACT nasal spray Place 2 sprays into both nostrils daily. 07/22/20   Janace Aris, NP  Omega-3 Fatty Acids (FISH OIL) 1000 MG CPDR Take 1,000 mg by mouth daily.    [provider]  triamterene-hydrochlorothiazide (DYAZIDE) 37.5-25 MG capsule Take 1 each (1 capsule total) by mouth daily. 11/28/19   Brock Bad, MD  albuterol (PROVENTIL HFA;VENTOLIN HFA) 108 (90 Base) MCG/ACT inhaler Inhale 1-2 puffs into the lungs every 6 (six) hours as needed for wheezing or shortness of breath. Patient not taking: Reported on 12/06/2018 11/06/16 08/12/19  Brock Bad, MD  medroxyPROGESTERone (DEPO-PROVERA) 150 MG/ML injection Inject 1 mL (150 mg total) into the muscle every 3  (three) months. Bring to office for administration. Patient not taking: Reported on 02/24/2019 12/06/18 08/12/19  Brock Bad, MD    Family History Family History  Problem Relation Age of Onset  . Hypertension Mother   . Diabetes Brother   . Hypertension Maternal Aunt   . Heart disease Maternal Aunt   . Cancer Maternal Grandmother   . Hypertension Maternal Grandmother   . Anesthesia problems Neg Hx     Social History Social History   Tobacco Use  . Smoking status: Never Smoker  . Smokeless tobacco: Never Used  Vaping Use  . Vaping Use: Never used  Substance Use Topics  . Alcohol use: Yes    Comment: occasional alcohol  . Drug use: Not Currently    Types: Marijuana    Comment: prior use     Allergies   Aspirin   Review of Systems Review of Systems   Physical Exam Triage Vital Signs ED Triage Vitals  Enc Vitals Group     BP 07/22/20 0919 135/88     Pulse Rate 07/22/20 0919 75  Resp 07/22/20 0919 20     Temp 07/22/20 0919 98.3 F (36.8 C)     Temp Source 07/22/20 0919 Oral     SpO2 07/22/20 0919 100 %     Weight --      Height --      Head Circumference --      Peak Flow --      Pain Score 07/22/20 0915 7     Pain Loc --      Pain Edu? --      Excl. in GC? --    No data found.  Updated Vital Signs BP 135/88 (BP Location: Right Arm)   Pulse 75   Temp 98.3 F (36.8 C) (Oral)   Resp 20   SpO2 100%   Visual Acuity Right Eye Distance:   Left Eye Distance:   Bilateral Distance:    Right Eye Near:   Left Eye Near:    Bilateral Near:     Physical Exam Vitals and nursing note reviewed.  Constitutional:      General: She is not in acute distress.    Appearance: Normal appearance. She is not ill-appearing, toxic-appearing or diaphoretic.  HENT:     Head: Normocephalic.     Right Ear: Tympanic membrane and ear canal normal.     Left Ear: Tympanic membrane and ear canal normal.     Ears:     Comments: Lymphadenopathy behind right ear     Nose: Nose normal.  Eyes:     Conjunctiva/sclera: Conjunctivae normal.  Pulmonary:     Effort: Pulmonary effort is normal.  Musculoskeletal:        General: Normal range of motion.     Cervical back: Normal range of motion.  Skin:    General: Skin is warm and dry.     Findings: No rash.  Neurological:     Mental Status: She is alert.  Psychiatric:        Mood and Affect: Mood normal.      UC Treatments / Results  Labs (all labs ordered are listed, but only abnormal results are displayed) Labs Reviewed - No data to display  EKG   Radiology No results found.  Procedures Procedures (including critical care time)  Medications Ordered in UC Medications - No data to display  Initial Impression / Assessment and Plan / UC Course  I have reviewed the triage vital signs and the nursing notes.  Pertinent labs & imaging results that were available during my care of the patient were reviewed by me and considered in my medical decision making (see chart for details).     Symptoms most likely due to allergies.  Most likely eustachian tube dysfunction Recommended continue allergy medication and Flonase daily No signs of ear infection.  Follow up as needed for continued or worsening symptoms  Final Clinical Impressions(s) / UC Diagnoses   Final diagnoses:  Dysfunction of right eustachian tube     Discharge Instructions     Flonase daily 2 sprays in each nostril.  Zyrtec daily Continue your allergy medication that you have at home Follow up as needed for continued or worsening symptoms     ED Prescriptions    Medication Sig Dispense Auth. Provider   fluticasone (FLONASE) 50 MCG/ACT nasal spray Place 2 sprays into both nostrils daily. 16 g Dahlia Byes A, NP     PDMP not reviewed this encounter.   Janace Aris, NP 07/23/20 1116

## 2020-08-03 DIAGNOSIS — J309 Allergic rhinitis, unspecified: Secondary | ICD-10-CM | POA: Diagnosis not present

## 2020-08-03 DIAGNOSIS — J343 Hypertrophy of nasal turbinates: Secondary | ICD-10-CM | POA: Diagnosis not present

## 2020-08-03 DIAGNOSIS — E049 Nontoxic goiter, unspecified: Secondary | ICD-10-CM | POA: Diagnosis not present

## 2020-08-06 ENCOUNTER — Other Ambulatory Visit: Payer: Self-pay | Admitting: Otolaryngology

## 2020-08-06 DIAGNOSIS — E049 Nontoxic goiter, unspecified: Secondary | ICD-10-CM

## 2020-08-13 ENCOUNTER — Ambulatory Visit
Admission: RE | Admit: 2020-08-13 | Discharge: 2020-08-13 | Disposition: A | Discharge status: Home / Self Care | Payer: Medicaid Other | Source: Ambulatory Visit | Attending: Otolaryngology | Admitting: Otolaryngology

## 2020-08-13 DIAGNOSIS — E049 Nontoxic goiter, unspecified: Secondary | ICD-10-CM

## 2020-08-13 DIAGNOSIS — E010 Iodine-deficiency related diffuse (endemic) goiter: Secondary | ICD-10-CM | POA: Diagnosis not present

## 2020-08-23 DIAGNOSIS — Z130 Encounter for screening for diseases of the blood and blood-forming organs and certain disorders involving the immune mechanism: Secondary | ICD-10-CM | POA: Diagnosis not present

## 2020-08-23 DIAGNOSIS — I10 Essential (primary) hypertension: Secondary | ICD-10-CM | POA: Diagnosis not present

## 2020-08-23 DIAGNOSIS — E010 Iodine-deficiency related diffuse (endemic) goiter: Secondary | ICD-10-CM | POA: Diagnosis not present

## 2020-08-23 DIAGNOSIS — Z808 Family history of malignant neoplasm of other organs or systems: Secondary | ICD-10-CM | POA: Insufficient documentation

## 2020-08-23 DIAGNOSIS — J3089 Other allergic rhinitis: Secondary | ICD-10-CM | POA: Insufficient documentation

## 2020-08-23 DIAGNOSIS — Z0000 Encounter for general adult medical examination without abnormal findings: Secondary | ICD-10-CM | POA: Diagnosis not present

## 2020-08-23 DIAGNOSIS — Z13220 Encounter for screening for lipoid disorders: Secondary | ICD-10-CM | POA: Diagnosis not present

## 2020-08-23 DIAGNOSIS — Z6834 Body mass index (BMI) 34.0-34.9, adult: Secondary | ICD-10-CM | POA: Diagnosis not present

## 2020-10-01 DIAGNOSIS — Z20822 Contact with and (suspected) exposure to covid-19: Secondary | ICD-10-CM | POA: Diagnosis not present

## 2020-10-03 DIAGNOSIS — K529 Noninfective gastroenteritis and colitis, unspecified: Secondary | ICD-10-CM | POA: Diagnosis not present

## 2020-10-03 DIAGNOSIS — R197 Diarrhea, unspecified: Secondary | ICD-10-CM | POA: Diagnosis not present

## 2020-10-03 DIAGNOSIS — Z20822 Contact with and (suspected) exposure to covid-19: Secondary | ICD-10-CM | POA: Diagnosis not present

## 2021-05-26 DIAGNOSIS — Z111 Encounter for screening for respiratory tuberculosis: Secondary | ICD-10-CM | POA: Diagnosis not present

## 2021-06-06 DIAGNOSIS — W5503XA Scratched by cat, initial encounter: Secondary | ICD-10-CM | POA: Diagnosis not present

## 2021-06-06 DIAGNOSIS — S20111A Abrasion of breast, right breast, initial encounter: Secondary | ICD-10-CM | POA: Diagnosis not present

## 2021-08-03 DIAGNOSIS — Z7721 Contact with and (suspected) exposure to potentially hazardous body fluids: Secondary | ICD-10-CM | POA: Diagnosis not present

## 2021-08-03 DIAGNOSIS — N898 Other specified noninflammatory disorders of vagina: Secondary | ICD-10-CM | POA: Diagnosis not present

## 2021-09-21 ENCOUNTER — Other Ambulatory Visit: Payer: Self-pay

## 2021-09-21 ENCOUNTER — Ambulatory Visit (HOSPITAL_COMMUNITY)
Admission: EM | Admit: 2021-09-21 | Discharge: 2021-09-21 | Disposition: A | Discharge status: Home / Self Care | Payer: Medicaid Other | Attending: Internal Medicine | Admitting: Internal Medicine

## 2021-09-21 ENCOUNTER — Encounter (HOSPITAL_COMMUNITY): Payer: Self-pay | Admitting: *Deleted

## 2021-09-21 DIAGNOSIS — B349 Viral infection, unspecified: Secondary | ICD-10-CM

## 2021-09-21 LAB — POC URINE PREG, ED: Preg Test, Ur: NEGATIVE

## 2021-09-21 MED ORDER — ONDANSETRON HCL 4 MG PO TABS: 4.0000 mg | ORAL_TABLET | Freq: Three times a day (TID) | ORAL | 0 refills | Status: DC | PRN

## 2021-09-21 NOTE — ED Provider Notes (Signed)
[facility]    CSN: [id number] Arrival date & time: 09/21/21  6195      History   Chief Complaint Chief Complaint  Patient presents with   Nausea   Headache   Possible Pregnancy    HPI Erica Mack is a 35 y.o. female comes to the urgent care for headache and nausea.  Patient denies any vomiting or diarrhea.  She has been unwell for the past 4 weeks.  She denies any fever or chills.  No generalized body aches.  No known relieving or aggravating factors for the headaches.  No dizziness, near syncope or syncopal episodes.  No extremity weakness, numbness or tingling.  Patient will like to have a pregnancy test.  She is sexually active and engages sexual intercourse with 1 partner. HPI  Past Medical History:  Diagnosis Date   Asthma    Chlamydia contact, treated    Gonorrhea    Headache(784.0)    Hypertension    no meds; dx 2008    Patient Active Problem List   Diagnosis Date Noted   History of migraine 07/10/2016    Past Surgical History:  Procedure Laterality Date   DILATION AND CURETTAGE OF UTERUS     EYE SURGERY     INDUCED ABORTION      OB History     Gravida  5   Para  3   Term  3   Preterm      AB  2   Living  3      SAB      IAB  2   Ectopic      Multiple  0   Live Births  3            Home Medications    Prior to Admission medications   Medication Sig Start Date End Date Taking? Authorizing Provider  ondansetron (ZOFRAN) 4 MG tablet Take 1 tablet (4 mg total) by mouth every 8 (eight) hours as needed for nausea or vomiting. 09/21/21  Yes Tishawna Larouche, Britta Mccreedy, MD  cetirizine (ZYRTEC) 10 MG tablet Take 1 tablet (10 mg total) by mouth daily. 11/28/19   Brock Bad, MD  fluticasone (FLONASE) 50 MCG/ACT nasal spray Place 2 sprays into both nostrils daily. 07/22/20   Janace Aris, NP  Omega-3 Fatty Acids (FISH OIL) 1000 MG CPDR Take 1,000 mg by mouth daily.    [provider]  triamterene-hydrochlorothiazide  (DYAZIDE) 37.5-25 MG capsule Take 1 each (1 capsule total) by mouth daily. 11/28/19   Brock Bad, MD  albuterol (PROVENTIL HFA;VENTOLIN HFA) 108 (90 Base) MCG/ACT inhaler Inhale 1-2 puffs into the lungs every 6 (six) hours as needed for wheezing or shortness of breath. Patient not taking: Reported on 12/06/2018 11/06/16 08/12/19  Brock Bad, MD  medroxyPROGESTERone (DEPO-PROVERA) 150 MG/ML injection Inject 1 mL (150 mg total) into the muscle every 3 (three) months. Bring to office for administration. Patient not taking: Reported on 02/24/2019 12/06/18 08/12/19  Brock Bad, MD    Family History Family History  Problem Relation Age of Onset   Hypertension Mother    Diabetes Brother    Cancer Maternal Grandmother    Hypertension Maternal Grandmother    Hypertension Maternal Aunt    Heart disease Maternal Aunt    Anesthesia problems Neg Hx     Social History Social History   Tobacco Use   Smoking status: Never   Smokeless tobacco: Never  Vaping Use   Vaping  Use: Never used  Substance Use Topics   Alcohol use: Yes    Comment: occasional alcohol   Drug use: Not Currently    Types: Marijuana    Comment: prior use     Allergies   Aspirin   Review of Systems Review of Systems As per HPI  Physical Exam Triage Vital Signs ED Triage Vitals  Enc Vitals Group     BP 09/21/21 1032 126/85     Pulse Rate 09/21/21 1032 80     Resp 09/21/21 1032 18     Temp 09/21/21 1032 98.6 F (37 C)     Temp src --      SpO2 09/21/21 1032 97 %     Weight --      Height --      Head Circumference --      Peak Flow --      Pain Score 09/21/21 1029 10     Pain Loc --      Pain Edu? --      Excl. in GC? --    No data found.  Updated Vital Signs BP 126/85    Pulse 80    Temp 98.6 F (37 C)    Resp 18    SpO2 97%   Visual Acuity Right Eye Distance:   Left Eye Distance:   Bilateral Distance:    Right Eye Near:   Left Eye Near:    Bilateral Near:     Physical  Exam Vitals and nursing note reviewed.  Cardiovascular:     Rate and Rhythm: Normal rate and regular rhythm.     Heart sounds: Normal heart sounds.  Pulmonary:     Effort: Pulmonary effort is normal.     Breath sounds: Normal breath sounds.  Abdominal:     General: Bowel sounds are normal.     Palpations: Abdomen is soft.  Neurological:     GCS: GCS eye subscore is 4. GCS verbal subscore is 5. GCS motor subscore is 6.     UC Treatments / Results  Labs (all labs ordered are listed, but only abnormal results are displayed) Labs Reviewed  POC URINE PREG, ED    EKG   Radiology No results found.  Procedures Procedures (including critical care time)  Medications Ordered in UC Medications - No data to display  Initial Impression / Assessment and Plan / UC Course  I have reviewed the triage vital signs and the nursing notes.  Pertinent labs & imaging results that were available during my care of the patient were reviewed by me and considered in my medical decision making (see chart for details).     1.  Acute viral illness: Zofran as needed for nausea/vomiting No indication for flu or COVID testing given the duration of symptoms Maintain adequate hydration Tylenol/Motrin as needed for pain/fever Return to urgent care if symptoms worsen. Final Clinical Impressions(s) / UC Diagnoses   Final diagnoses:  Viral illness     Discharge Instructions      Take medications as prescribed Increase oral fluid intake If symptoms worsens please return to urgent care to be reevaluated No indication for testing for flu or COVID because your children tested negative for flu and COVID.   ED Prescriptions     Medication Sig Dispense Auth. Provider   ondansetron (ZOFRAN) 4 MG tablet Take 1 tablet (4 mg total) by mouth every 8 (eight) hours as needed for nausea or vomiting. 20 tablet Chaniqua Brisby, Britta Mccreedy, MD  PDMP not reviewed this encounter.   Merrilee Jansky,  MD 09/21/21 [phone]

## 2021-09-21 NOTE — ED Notes (Signed)
Urine specimen in lab 

## 2021-09-21 NOTE — ED Triage Notes (Signed)
Pt reports Ha ,nausea and not sure if she is pregnant . Sx's on going for last 4 weeks. T reports other members of Family have been sick.

## 2021-09-21 NOTE — Discharge Instructions (Signed)
Take medications as prescribed Increase oral fluid intake If symptoms worsens please return to urgent care to be reevaluated No indication for testing for flu or COVID because your children tested negative for flu and COVID.

## 2021-11-22 DIAGNOSIS — Z3046 Encounter for surveillance of implantable subdermal contraceptive: Secondary | ICD-10-CM | POA: Diagnosis not present

## 2021-11-22 DIAGNOSIS — Z30017 Encounter for initial prescription of implantable subdermal contraceptive: Secondary | ICD-10-CM | POA: Diagnosis not present

## 2021-11-30 DIAGNOSIS — J45901 Unspecified asthma with (acute) exacerbation: Secondary | ICD-10-CM | POA: Diagnosis not present

## 2021-11-30 DIAGNOSIS — B3731 Acute candidiasis of vulva and vagina: Secondary | ICD-10-CM | POA: Diagnosis not present

## 2021-11-30 DIAGNOSIS — J301 Allergic rhinitis due to pollen: Secondary | ICD-10-CM | POA: Diagnosis not present

## 2021-11-30 DIAGNOSIS — R062 Wheezing: Secondary | ICD-10-CM | POA: Diagnosis not present

## 2021-11-30 DIAGNOSIS — H6502 Acute serous otitis media, left ear: Secondary | ICD-10-CM | POA: Diagnosis not present

## 2022-01-26 DIAGNOSIS — Z202 Contact with and (suspected) exposure to infections with a predominantly sexual mode of transmission: Secondary | ICD-10-CM | POA: Diagnosis not present

## 2022-02-05 ENCOUNTER — Other Ambulatory Visit: Payer: Self-pay

## 2022-02-05 ENCOUNTER — Emergency Department (HOSPITAL_COMMUNITY)
Admission: EM | Admit: 2022-02-05 | Discharge: 2022-02-05 | Disposition: A | Discharge status: Home / Self Care | Payer: Medicaid Other | Attending: Emergency Medicine | Admitting: Emergency Medicine

## 2022-02-05 ENCOUNTER — Encounter (HOSPITAL_COMMUNITY): Payer: Self-pay

## 2022-02-05 DIAGNOSIS — Z79899 Other long term (current) drug therapy: Secondary | ICD-10-CM | POA: Diagnosis not present

## 2022-02-05 DIAGNOSIS — M62830 Muscle spasm of back: Secondary | ICD-10-CM | POA: Diagnosis not present

## 2022-02-05 DIAGNOSIS — M542 Cervicalgia: Secondary | ICD-10-CM | POA: Diagnosis present

## 2022-02-05 DIAGNOSIS — I10 Essential (primary) hypertension: Secondary | ICD-10-CM | POA: Insufficient documentation

## 2022-02-05 DIAGNOSIS — M5412 Radiculopathy, cervical region: Secondary | ICD-10-CM | POA: Diagnosis not present

## 2022-02-05 MED ORDER — METHOCARBAMOL 500 MG PO TABS: 500.0000 mg | ORAL_TABLET | Freq: Two times a day (BID) | ORAL | 0 refills | Status: DC

## 2022-02-05 MED ORDER — METHOCARBAMOL 500 MG PO TABS
500.0000 mg | ORAL_TABLET | Freq: Once | ORAL | Status: AC
  Administered 2022-02-05: 500 mg via ORAL
  Filled 2022-02-05: qty 1

## 2022-02-05 MED ORDER — PREDNISONE 10 MG (21) PO TBPK: ORAL_TABLET | Freq: Every day | ORAL | 0 refills | Status: DC

## 2022-02-05 MED ORDER — LIDOCAINE 5 % EX PTCH
1.0000 | MEDICATED_PATCH | CUTANEOUS | Status: DC
  Administered 2022-02-05: 1 via TRANSDERMAL
  Filled 2022-02-05: qty 1

## 2022-02-05 MED ORDER — KETOROLAC TROMETHAMINE 60 MG/2ML IM SOLN
30.0000 mg | Freq: Once | INTRAMUSCULAR | Status: AC
  Administered 2022-02-05: 30 mg via INTRAMUSCULAR
  Filled 2022-02-05: qty 2

## 2022-02-05 MED ORDER — PREDNISONE 50 MG PO TABS
60.0000 mg | ORAL_TABLET | Freq: Once | ORAL | Status: AC
  Administered 2022-02-05: 60 mg via ORAL
  Filled 2022-02-05: qty 1

## 2022-02-05 NOTE — ED Triage Notes (Signed)
Patient arrived with complaints of sharp neck pain that radiates down left arm x2 weeks. 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  ?
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
## 2022-02-05 NOTE — Discharge Instructions (Addendum)
Please pick up medications and take as prescribed. Avoid NSAIDs while taking the prednisone as it can cause GI upset. You can take Tylenol with it.  ? ?DO NOT DRIVE OR DRINK ALCOHOL WHILE ON THE MUSCLE RELAXER AS IT CAN MAKE YOU DROWSY. It is recommended that you take this at nighttime to help you sleep.  ? ?You can buy Salon Pas patches OTC to apply to your back to help with pain.  ? ?Please follow up with your PCP for further eval.  ? ?Return to the ED for any new/worsening symptoms ?

## 2022-02-06 ENCOUNTER — Encounter (HOSPITAL_COMMUNITY): Payer: Self-pay

## 2022-02-06 ENCOUNTER — Other Ambulatory Visit: Payer: Self-pay

## 2022-02-06 ENCOUNTER — Emergency Department (HOSPITAL_COMMUNITY)
Admission: EM | Admit: 2022-02-06 | Discharge: 2022-02-06 | Discharge status: Against Medical Advice | Payer: Medicaid Other | Attending: Emergency Medicine | Admitting: Emergency Medicine

## 2022-02-06 DIAGNOSIS — M79602 Pain in left arm: Secondary | ICD-10-CM | POA: Diagnosis not present

## 2022-02-06 DIAGNOSIS — M542 Cervicalgia: Secondary | ICD-10-CM | POA: Diagnosis not present

## 2022-02-06 DIAGNOSIS — Z5321 Procedure and treatment not carried out due to patient leaving prior to being seen by health care provider: Secondary | ICD-10-CM | POA: Insufficient documentation

## 2022-02-06 NOTE — ED Provider Triage Note (Signed)
Emergency Medicine Provider Triage Evaluation Note ? ?Erica Mack , a 36 y.o. female  was evaluated in triage.  Pt complains of continued left-sided neck pain.  Symptoms have been intermittent for 2 weeks.  Seen at [facility] yesterday but did not take the medications prescribed to her.  Reports sharp shooting pain down her left arm.  No injury or trauma, prior neck surgery, fever. ? ?Review of Systems  ?Positive: Neck pain ?Negative: Numbness ? ?Physical Exam  ?BP 127/83 (BP Location: Right Arm)   Pulse 77   Temp 98.6 ?F (37 ?C) (Oral)   Resp 16   Ht 5\' 6"  (1.676 m)   Wt 95.3 kg   SpO2 100%   BMI 33.89 kg/m?  ?Gen:   Awake, no distress   ?Resp:  Normal effort  ?MSK:   Moves extremities without difficulty  ?Other:  Tenderness palpation of the left paraspinal musculature of the cervical spine without midline tenderness equal grip strength bilaterally ? ?Medical Decision Making  ?Medically screening exam initiated at 12:16 PM.  Appropriate orders placed.  [name] was informed that the remainder of the evaluation will be completed by another provider, this initial triage assessment does not replace that evaluation, and the importance of remaining in the ED until their evaluation is complete. ? ? ?  Delia Heady, PA-C ?02/06/22 1217 ? ?

## 2022-02-06 NOTE — ED Triage Notes (Signed)
Pt arrived POV from home c/o neck pain that radiates down her left arm x2 weeks. Pt was seen at [facility] yesterday and given pain meds, a muscle relaxer and a shot but she states it is not better.  ?

## 2022-02-21 DIAGNOSIS — Z808 Family history of malignant neoplasm of other organs or systems: Secondary | ICD-10-CM | POA: Diagnosis not present

## 2022-02-21 DIAGNOSIS — Z13220 Encounter for screening for lipoid disorders: Secondary | ICD-10-CM | POA: Diagnosis not present

## 2022-02-21 DIAGNOSIS — Z6834 Body mass index (BMI) 34.0-34.9, adult: Secondary | ICD-10-CM | POA: Diagnosis not present

## 2022-02-21 DIAGNOSIS — Z0000 Encounter for general adult medical examination without abnormal findings: Secondary | ICD-10-CM | POA: Diagnosis not present

## 2022-02-21 DIAGNOSIS — Z130 Encounter for screening for diseases of the blood and blood-forming organs and certain disorders involving the immune mechanism: Secondary | ICD-10-CM | POA: Diagnosis not present

## 2022-02-21 DIAGNOSIS — E010 Iodine-deficiency related diffuse (endemic) goiter: Secondary | ICD-10-CM | POA: Diagnosis not present

## 2022-02-21 DIAGNOSIS — I10 Essential (primary) hypertension: Secondary | ICD-10-CM | POA: Diagnosis not present

## 2022-03-28 ENCOUNTER — Inpatient Hospital Stay (HOSPITAL_COMMUNITY)
Admission: EM | Admit: 2022-03-28 | Discharge: 2022-04-02 | DRG: 578 | Disposition: A | Discharge status: Home / Self Care | Payer: Medicaid Other | Attending: Student | Admitting: Student

## 2022-03-28 ENCOUNTER — Emergency Department (HOSPITAL_COMMUNITY): Payer: Medicaid Other

## 2022-03-28 ENCOUNTER — Other Ambulatory Visit: Payer: Self-pay

## 2022-03-28 ENCOUNTER — Encounter (HOSPITAL_COMMUNITY): Payer: Self-pay | Admitting: Emergency Medicine

## 2022-03-28 DIAGNOSIS — S71132A Puncture wound without foreign body, left thigh, initial encounter: Principal | ICD-10-CM | POA: Diagnosis present

## 2022-03-28 DIAGNOSIS — Z886 Allergy status to analgesic agent status: Secondary | ICD-10-CM | POA: Diagnosis not present

## 2022-03-28 DIAGNOSIS — Z87828 Personal history of other (healed) physical injury and trauma: Secondary | ICD-10-CM | POA: Diagnosis not present

## 2022-03-28 DIAGNOSIS — S3993XA Unspecified injury of pelvis, initial encounter: Secondary | ICD-10-CM | POA: Diagnosis not present

## 2022-03-28 DIAGNOSIS — S85902A Unspecified injury of unspecified blood vessel at lower leg level, left leg, initial encounter: Secondary | ICD-10-CM | POA: Diagnosis not present

## 2022-03-28 DIAGNOSIS — Z833 Family history of diabetes mellitus: Secondary | ICD-10-CM

## 2022-03-28 DIAGNOSIS — S81802A Unspecified open wound, left lower leg, initial encounter: Secondary | ICD-10-CM | POA: Diagnosis not present

## 2022-03-28 DIAGNOSIS — S71102A Unspecified open wound, left thigh, initial encounter: Secondary | ICD-10-CM | POA: Diagnosis not present

## 2022-03-28 DIAGNOSIS — Z20822 Contact with and (suspected) exposure to covid-19: Secondary | ICD-10-CM | POA: Diagnosis not present

## 2022-03-28 DIAGNOSIS — S81032A Puncture wound without foreign body, left knee, initial encounter: Secondary | ICD-10-CM | POA: Diagnosis present

## 2022-03-28 DIAGNOSIS — Y249XXA Unspecified firearm discharge, undetermined intent, initial encounter: Secondary | ICD-10-CM

## 2022-03-28 DIAGNOSIS — S92411A Displaced fracture of proximal phalanx of right great toe, initial encounter for closed fracture: Secondary | ICD-10-CM | POA: Diagnosis not present

## 2022-03-28 DIAGNOSIS — S85901A Unspecified injury of unspecified blood vessel at lower leg level, right leg, initial encounter: Secondary | ICD-10-CM | POA: Diagnosis not present

## 2022-03-28 DIAGNOSIS — S92411B Displaced fracture of proximal phalanx of right great toe, initial encounter for open fracture: Secondary | ICD-10-CM | POA: Diagnosis present

## 2022-03-28 DIAGNOSIS — W3400XA Accidental discharge from unspecified firearms or gun, initial encounter: Principal | ICD-10-CM

## 2022-03-28 DIAGNOSIS — S92411D Displaced fracture of proximal phalanx of right great toe, subsequent encounter for fracture with routine healing: Secondary | ICD-10-CM

## 2022-03-28 DIAGNOSIS — Z8249 Family history of ischemic heart disease and other diseases of the circulatory system: Secondary | ICD-10-CM

## 2022-03-28 DIAGNOSIS — S299XXA Unspecified injury of thorax, initial encounter: Secondary | ICD-10-CM | POA: Diagnosis not present

## 2022-03-28 LAB — URINALYSIS, ROUTINE W REFLEX MICROSCOPIC
Bilirubin Urine: NEGATIVE
Glucose, UA: NEGATIVE mg/dL
Ketones, ur: NEGATIVE mg/dL
Leukocytes,Ua: NEGATIVE
Nitrite: NEGATIVE
Protein, ur: NEGATIVE mg/dL
Specific Gravity, Urine: >1.046 — ABNORMAL HIGH (ref 1.005–1.030)
pH: 7 (ref 5.0–8.0)

## 2022-03-28 LAB — CBC
HCT: 40.7 % (ref 36.0–46.0)
Hemoglobin: 13.5 g/dL (ref 12.0–15.0)
MCH: 29.6 pg (ref 26.0–34.0)
MCHC: 33.2 g/dL (ref 30.0–36.0)
MCV: 89.3 fL (ref 80.0–100.0)
Platelets: 309 10*3/uL (ref 150–400)
RBC: 4.56 MIL/uL (ref 3.87–5.11)
RDW: 12.9 % (ref 11.5–15.5)
WBC: 12.1 10*3/uL — ABNORMAL HIGH (ref 4.0–10.5)
nRBC: 0 % (ref 0.0–0.2)

## 2022-03-28 LAB — COMPREHENSIVE METABOLIC PANEL
ALT: 10 U/L (ref 0–44)
AST: 20 U/L (ref 15–41)
Albumin: 4 g/dL (ref 3.5–5.0)
Alkaline Phosphatase: 51 U/L (ref 38–126)
Anion gap: 16 — ABNORMAL HIGH (ref 5–15)
BUN: 8 mg/dL (ref 6–20)
CO2: 19 mmol/L — ABNORMAL LOW (ref 22–32)
Calcium: 9.3 mg/dL (ref 8.9–10.3)
Chloride: 106 mmol/L (ref 98–111)
Creatinine, Ser: 0.96 mg/dL (ref 0.44–1.00)
GFR, Estimated: >60 mL/min (ref >60)
Glucose, Bld: 132 mg/dL — ABNORMAL HIGH (ref 70–99)
Potassium: 3.6 mmol/L (ref 3.5–5.1)
Sodium: 141 mmol/L (ref 135–145)
Total Bilirubin: 0.8 mg/dL (ref 0.3–1.2)
Total Protein: 7.2 g/dL (ref 6.5–8.1)

## 2022-03-28 LAB — LACTIC ACID, PLASMA: Lactic Acid, Venous: 1.9 mmol/L (ref 0.5–1.9)

## 2022-03-28 LAB — I-STAT CHEM 8, ED
BUN: 5 mg/dL — ABNORMAL LOW (ref 6–20)
Calcium, Ion: 1.05 mmol/L — ABNORMAL LOW (ref 1.15–1.40)
Chloride: 107 mmol/L (ref 98–111)
Creatinine, Ser: 0.8 mg/dL (ref 0.44–1.00)
Glucose, Bld: 134 mg/dL — ABNORMAL HIGH (ref 70–99)
HCT: 41 % (ref 36.0–46.0)
Hemoglobin: 13.9 g/dL (ref 12.0–15.0)
Potassium: 3.4 mmol/L — ABNORMAL LOW (ref 3.5–5.1)
Sodium: 138 mmol/L (ref 135–145)
TCO2: 20 mmol/L — ABNORMAL LOW (ref 22–32)

## 2022-03-28 LAB — I-STAT BETA HCG BLOOD, ED (MC, WL, AP ONLY): I-stat hCG, quantitative: <5 m[IU]/mL (ref <5)

## 2022-03-28 LAB — SAMPLE TO BLOOD BANK

## 2022-03-28 LAB — RESP PANEL BY RT-PCR (FLU A&B, COVID) ARPGX2
Influenza A by PCR: NEGATIVE
Influenza B by PCR: NEGATIVE
SARS Coronavirus 2 by RT PCR: NEGATIVE

## 2022-03-28 LAB — PROTIME-INR
INR: 1.1 (ref 0.8–1.2)
Prothrombin Time: 14.4 seconds (ref 11.4–15.2)

## 2022-03-28 LAB — ETHANOL: Alcohol, Ethyl (B): <10 mg/dL (ref <10)

## 2022-03-28 MED ORDER — METHOCARBAMOL 1000 MG/10ML IJ SOLN: 500.0000 mg | Freq: Four times a day (QID) | INTRAVENOUS | Status: DC | PRN

## 2022-03-28 MED ORDER — CEFAZOLIN SODIUM-DEXTROSE 1-4 GM/50ML-% IV SOLN
1.0000 g | Freq: Once | INTRAVENOUS | Status: AC
  Administered 2022-03-28: 1 g via INTRAVENOUS

## 2022-03-28 MED ORDER — MORPHINE SULFATE (PF) 4 MG/ML IV SOLN
6.0000 mg | Freq: Once | INTRAVENOUS | Status: AC
  Administered 2022-03-28: 6 mg via INTRAVENOUS
  Filled 2022-03-28: qty 2

## 2022-03-28 MED ORDER — ONDANSETRON HCL 4 MG PO TABS: 4.0000 mg | ORAL_TABLET | Freq: Four times a day (QID) | ORAL | Status: DC | PRN

## 2022-03-28 MED ORDER — FENTANYL CITRATE (PF) 100 MCG/2ML IJ SOLN
INTRAMUSCULAR | Status: AC
  Administered 2022-03-28: 100 ug
  Filled 2022-03-28: qty 2

## 2022-03-28 MED ORDER — MORPHINE SULFATE (PF) 4 MG/ML IV SOLN: 6.0000 mg | Freq: Once | INTRAVENOUS | Status: DC

## 2022-03-28 MED ORDER — METHOCARBAMOL 500 MG PO TABS
500.0000 mg | ORAL_TABLET | Freq: Four times a day (QID) | ORAL | Status: DC | PRN
  Administered 2022-03-29 – 2022-03-31 (×4): 500 mg via ORAL
  Filled 2022-03-28 (×4): qty 1

## 2022-03-28 MED ORDER — ONDANSETRON HCL 4 MG/2ML IJ SOLN
4.0000 mg | Freq: Four times a day (QID) | INTRAMUSCULAR | Status: DC | PRN
  Administered 2022-03-29 – 2022-04-01 (×5): 4 mg via INTRAVENOUS
  Filled 2022-03-28 (×5): qty 2

## 2022-03-28 MED ORDER — SENNA 8.6 MG PO TABS
1.0000 | ORAL_TABLET | Freq: Two times a day (BID) | ORAL | Status: DC
  Administered 2022-03-29 – 2022-04-02 (×6): 8.6 mg via ORAL
  Filled 2022-03-28 (×10): qty 1

## 2022-03-28 MED ORDER — ACETAMINOPHEN 325 MG PO TABS: 325.0000 mg | ORAL_TABLET | Freq: Four times a day (QID) | ORAL | Status: DC | PRN

## 2022-03-28 MED ORDER — DOCUSATE SODIUM 100 MG PO CAPS
100.0000 mg | ORAL_CAPSULE | Freq: Two times a day (BID) | ORAL | Status: DC
  Filled 2022-03-28 (×2): qty 1

## 2022-03-28 MED ORDER — ONDANSETRON HCL 4 MG/2ML IJ SOLN
4.0000 mg | Freq: Once | INTRAMUSCULAR | Status: AC
  Administered 2022-03-28: 4 mg via INTRAVENOUS
  Filled 2022-03-28: qty 2

## 2022-03-28 MED ORDER — MORPHINE SULFATE (PF) 2 MG/ML IV SOLN
0.5000 mg | INTRAVENOUS | Status: DC | PRN
  Administered 2022-03-29 – 2022-04-01 (×3): 1 mg via INTRAVENOUS
  Filled 2022-03-28 (×5): qty 1

## 2022-03-28 MED ORDER — TETANUS-DIPHTH-ACELL PERTUSSIS 5-2.5-18.5 LF-MCG/0.5 IM SUSY: 0.5000 mL | PREFILLED_SYRINGE | Freq: Once | INTRAMUSCULAR | Status: DC

## 2022-03-28 MED ORDER — HYDROCODONE-ACETAMINOPHEN 7.5-325 MG PO TABS: 1.0000 | ORAL_TABLET | ORAL | Status: DC | PRN

## 2022-03-28 MED ORDER — HYDROCODONE-ACETAMINOPHEN 5-325 MG PO TABS
1.0000 | ORAL_TABLET | ORAL | Status: DC | PRN
  Administered 2022-03-28 – 2022-03-29 (×3): 2 via ORAL
  Filled 2022-03-28 (×3): qty 2

## 2022-03-28 MED ORDER — IOHEXOL 350 MG/ML SOLN
100.0000 mL | Freq: Once | INTRAVENOUS | Status: AC | PRN
  Administered 2022-03-28: 100 mL via INTRAVENOUS

## 2022-03-28 NOTE — Progress Notes (Signed)
Orthopedic Tech Progress Note Patient Details:  Erica Mack 02/13/1986 [id number]  Ortho Devices Type of Ortho Device: Postop shoe/boot Ortho Device/Splint Location: RLE Ortho Device/Splint Interventions: Ordered, Application, Adjustment, Removal   Post Interventions Patient Tolerated: Well Instructions Provided: Care of device  Donald Pore 03/28/2022, 7:13 PM
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
## 2022-03-28 NOTE — ED Notes (Signed)
All jewelry placed in denture cup.
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
## 2022-03-28 NOTE — ED Notes (Signed)
TRAUMA END AT 1934

## 2022-03-29 ENCOUNTER — Inpatient Hospital Stay (HOSPITAL_COMMUNITY): Payer: Medicaid Other | Admitting: Anesthesiology

## 2022-03-29 ENCOUNTER — Other Ambulatory Visit: Payer: Self-pay

## 2022-03-29 ENCOUNTER — Encounter (HOSPITAL_COMMUNITY): Payer: Self-pay | Admitting: Student

## 2022-03-29 ENCOUNTER — Encounter (HOSPITAL_COMMUNITY)
Admission: EM | Disposition: A | Discharge status: Home / Self Care | Payer: Self-pay | Source: Home / Self Care | Attending: Student

## 2022-03-29 DIAGNOSIS — S71102A Unspecified open wound, left thigh, initial encounter: Secondary | ICD-10-CM | POA: Diagnosis not present

## 2022-03-29 DIAGNOSIS — S92411D Displaced fracture of proximal phalanx of right great toe, subsequent encounter for fracture with routine healing: Secondary | ICD-10-CM

## 2022-03-29 DIAGNOSIS — S92411B Displaced fracture of proximal phalanx of right great toe, initial encounter for open fracture: Secondary | ICD-10-CM

## 2022-03-29 DIAGNOSIS — S71132A Puncture wound without foreign body, left thigh, initial encounter: Secondary | ICD-10-CM | POA: Diagnosis not present

## 2022-03-29 HISTORY — PX: IRRIGATION AND DEBRIDEMENT KNEE: SHX5185

## 2022-03-29 LAB — HIV ANTIBODY (ROUTINE TESTING W REFLEX): HIV Screen 4th Generation wRfx: NONREACTIVE

## 2022-03-29 SURGERY — IRRIGATION AND DEBRIDEMENT KNEE
Anesthesia: General | Site: Leg Upper | Laterality: Left

## 2022-03-29 MED ORDER — OXYCODONE HCL 5 MG PO TABS: 5.0000 mg | ORAL_TABLET | Freq: Once | ORAL | Status: DC | PRN

## 2022-03-29 MED ORDER — SUCCINYLCHOLINE CHLORIDE 200 MG/10ML IV SOSY
PREFILLED_SYRINGE | INTRAVENOUS | Status: DC | PRN
  Administered 2022-03-29: 120 mg via INTRAVENOUS

## 2022-03-29 MED ORDER — LACTATED RINGERS IV SOLN: INTRAVENOUS | Status: DC

## 2022-03-29 MED ORDER — FENTANYL CITRATE (PF) 100 MCG/2ML IJ SOLN
25.0000 ug | INTRAMUSCULAR | Status: DC | PRN
  Administered 2022-03-29 (×2): 50 ug via INTRAVENOUS

## 2022-03-29 MED ORDER — FENTANYL CITRATE (PF) 250 MCG/5ML IJ SOLN
INTRAMUSCULAR | Status: AC
  Filled 2022-03-29: qty 5

## 2022-03-29 MED ORDER — SODIUM CHLORIDE 0.9 % IR SOLN
Status: DC | PRN
  Administered 2022-03-29: 3000 mL

## 2022-03-29 MED ORDER — POLYETHYLENE GLYCOL 3350 17 G PO PACK
17.0000 g | PACK | Freq: Every day | ORAL | Status: DC | PRN
  Filled 2022-03-29: qty 1

## 2022-03-29 MED ORDER — ACETAMINOPHEN 10 MG/ML IV SOLN: 1000.0000 mg | Freq: Once | INTRAVENOUS | Status: DC

## 2022-03-29 MED ORDER — DEXAMETHASONE SODIUM PHOSPHATE 10 MG/ML IJ SOLN
INTRAMUSCULAR | Status: DC | PRN
  Administered 2022-03-29: 10 mg via INTRAVENOUS

## 2022-03-29 MED ORDER — METOCLOPRAMIDE HCL 5 MG/ML IJ SOLN: 5.0000 mg | Freq: Three times a day (TID) | INTRAMUSCULAR | Status: DC | PRN

## 2022-03-29 MED ORDER — PROPOFOL 10 MG/ML IV BOLUS
INTRAVENOUS | Status: DC | PRN
  Administered 2022-03-29: 200 mg via INTRAVENOUS

## 2022-03-29 MED ORDER — OXYCODONE HCL 5 MG PO TABS
5.0000 mg | ORAL_TABLET | ORAL | Status: DC | PRN
  Administered 2022-03-29 – 2022-04-02 (×11): 10 mg via ORAL
  Filled 2022-03-29 (×3): qty 2
  Filled 2022-03-29: qty 1
  Filled 2022-03-29 (×8): qty 2

## 2022-03-29 MED ORDER — ONDANSETRON HCL 4 MG/2ML IJ SOLN: 4.0000 mg | Freq: Four times a day (QID) | INTRAMUSCULAR | Status: DC | PRN

## 2022-03-29 MED ORDER — CEFAZOLIN SODIUM-DEXTROSE 2-4 GM/100ML-% IV SOLN
2.0000 g | Freq: Three times a day (TID) | INTRAVENOUS | Status: AC
  Administered 2022-03-29 – 2022-03-30 (×3): 2 g via INTRAVENOUS
  Filled 2022-03-29 (×3): qty 100

## 2022-03-29 MED ORDER — DEXAMETHASONE SODIUM PHOSPHATE 10 MG/ML IJ SOLN
INTRAMUSCULAR | Status: AC
  Filled 2022-03-29: qty 1

## 2022-03-29 MED ORDER — CHLORHEXIDINE GLUCONATE 0.12 % MT SOLN
15.0000 mL | Freq: Once | OROMUCOSAL | Status: AC
Start: 2022-03-29 — End: 2022-03-29

## 2022-03-29 MED ORDER — SODIUM CHLORIDE 0.9 % IV SOLN: INTRAVENOUS | Status: DC

## 2022-03-29 MED ORDER — DOCUSATE SODIUM 100 MG PO CAPS
100.0000 mg | ORAL_CAPSULE | Freq: Two times a day (BID) | ORAL | Status: DC
  Administered 2022-03-29 – 2022-04-02 (×7): 100 mg via ORAL
  Filled 2022-03-29 (×8): qty 1

## 2022-03-29 MED ORDER — ACETAMINOPHEN 500 MG PO TABS
1000.0000 mg | ORAL_TABLET | Freq: Once | ORAL | Status: DC
  Filled 2022-03-29: qty 2

## 2022-03-29 MED ORDER — HYDRALAZINE HCL 10 MG PO TABS: 10.0000 mg | ORAL_TABLET | Freq: Four times a day (QID) | ORAL | Status: DC | PRN

## 2022-03-29 MED ORDER — ACETAMINOPHEN 10 MG/ML IV SOLN
INTRAVENOUS | Status: AC
  Filled 2022-03-29: qty 100

## 2022-03-29 MED ORDER — CEFAZOLIN SODIUM-DEXTROSE 2-3 GM-%(50ML) IV SOLR
INTRAVENOUS | Status: DC | PRN
  Administered 2022-03-29: 2 g via INTRAVENOUS

## 2022-03-29 MED ORDER — CEFAZOLIN SODIUM-DEXTROSE 2-4 GM/100ML-% IV SOLN
INTRAVENOUS | Status: AC
  Filled 2022-03-29: qty 100

## 2022-03-29 MED ORDER — OXYCODONE HCL 5 MG/5ML PO SOLN: 5.0000 mg | Freq: Once | ORAL | Status: DC | PRN

## 2022-03-29 MED ORDER — TRIAMTERENE-HCTZ 37.5-25 MG PO TABS
1.0000 | ORAL_TABLET | Freq: Every day | ORAL | Status: DC
  Administered 2022-03-29 – 2022-04-02 (×5): 1 via ORAL
  Filled 2022-03-29 (×5): qty 1

## 2022-03-29 MED ORDER — ENOXAPARIN SODIUM 40 MG/0.4ML IJ SOSY
40.0000 mg | PREFILLED_SYRINGE | INTRAMUSCULAR | Status: DC
  Administered 2022-03-30 – 2022-04-02 (×4): 40 mg via SUBCUTANEOUS
  Filled 2022-03-29 (×6): qty 0.4

## 2022-03-29 MED ORDER — ONDANSETRON HCL 4 MG/2ML IJ SOLN
INTRAMUSCULAR | Status: AC
  Filled 2022-03-29: qty 4

## 2022-03-29 MED ORDER — CHLORHEXIDINE GLUCONATE 0.12 % MT SOLN
OROMUCOSAL | Status: AC
  Administered 2022-03-29: 15 mL via OROMUCOSAL
  Filled 2022-03-29: qty 15

## 2022-03-29 MED ORDER — FENTANYL CITRATE (PF) 100 MCG/2ML IJ SOLN
INTRAMUSCULAR | Status: AC
  Filled 2022-03-29: qty 2

## 2022-03-29 MED ORDER — MIDAZOLAM HCL 2 MG/2ML IJ SOLN
INTRAMUSCULAR | Status: AC
  Filled 2022-03-29: qty 2

## 2022-03-29 MED ORDER — DIPHENHYDRAMINE HCL 12.5 MG/5ML PO ELIX: 12.5000 mg | ORAL_SOLUTION | ORAL | Status: DC | PRN

## 2022-03-29 MED ORDER — MIDAZOLAM HCL 5 MG/5ML IJ SOLN
INTRAMUSCULAR | Status: DC | PRN
  Administered 2022-03-29: 2 mg via INTRAVENOUS

## 2022-03-29 MED ORDER — METOCLOPRAMIDE HCL 5 MG PO TABS: 5.0000 mg | ORAL_TABLET | Freq: Three times a day (TID) | ORAL | Status: DC | PRN

## 2022-03-29 MED ORDER — LIDOCAINE 2% (20 MG/ML) 5 ML SYRINGE
INTRAMUSCULAR | Status: AC
  Filled 2022-03-29: qty 5

## 2022-03-29 MED ORDER — ACETAMINOPHEN 500 MG PO TABS
1000.0000 mg | ORAL_TABLET | Freq: Four times a day (QID) | ORAL | Status: DC
  Administered 2022-03-29 – 2022-03-30 (×2): 1000 mg via ORAL
  Filled 2022-03-29 (×3): qty 2

## 2022-03-29 MED ORDER — ACETAMINOPHEN 10 MG/ML IV SOLN
INTRAVENOUS | Status: DC | PRN
  Administered 2022-03-29: 1000 mg via INTRAVENOUS

## 2022-03-29 MED ORDER — ONDANSETRON HCL 4 MG/2ML IJ SOLN
INTRAMUSCULAR | Status: DC | PRN
  Administered 2022-03-29 (×2): 4 mg via INTRAVENOUS

## 2022-03-29 MED ORDER — PROPOFOL 10 MG/ML IV BOLUS
INTRAVENOUS | Status: AC
  Filled 2022-03-29: qty 20

## 2022-03-29 MED ORDER — LIDOCAINE 2% (20 MG/ML) 5 ML SYRINGE
INTRAMUSCULAR | Status: DC | PRN
  Administered 2022-03-29: 60 mg via INTRAVENOUS

## 2022-03-29 MED ORDER — VANCOMYCIN HCL 1000 MG IV SOLR
INTRAVENOUS | Status: AC
  Filled 2022-03-29: qty 20

## 2022-03-29 MED ORDER — FENTANYL CITRATE (PF) 100 MCG/2ML IJ SOLN
INTRAMUSCULAR | Status: DC | PRN
  Administered 2022-03-29 (×2): 50 ug via INTRAVENOUS

## 2022-03-29 MED ORDER — ORAL CARE MOUTH RINSE: 15.0000 mL | Freq: Once | OROMUCOSAL | Status: AC

## 2022-03-29 MED ORDER — CHLORHEXIDINE GLUCONATE 0.12 % MT SOLN: 15.0000 mL | OROMUCOSAL | Status: AC

## 2022-03-29 SURGICAL SUPPLY — 47 items
BAG COUNTER SPONGE SURGICOUNT (BAG) ×4 IMPLANT
BAG SPNG CNTER NS LX DISP (BAG) ×2
BNDG COHESIVE 6X5 TAN NS LF (GAUZE/BANDAGES/DRESSINGS) ×2 IMPLANT
BNDG ELASTIC 4X5.8 VLCR STR LF (GAUZE/BANDAGES/DRESSINGS) IMPLANT
BNDG ELASTIC 6X5.8 VLCR STR LF (GAUZE/BANDAGES/DRESSINGS) ×2 IMPLANT
BNDG GAUZE ELAST 4 BULKY (GAUZE/BANDAGES/DRESSINGS) IMPLANT
CANISTER SUCT 3000ML PPV (MISCELLANEOUS) ×4 IMPLANT
CANISTER WOUND CARE 500ML ATS (WOUND CARE) ×2 IMPLANT
COVER SURGICAL LIGHT HANDLE (MISCELLANEOUS) ×4 IMPLANT
DRAPE HALF SHEET 40X57 (DRAPES) IMPLANT
DRAPE IMP U-DRAPE 54X76 (DRAPES) ×2 IMPLANT
DRAPE INCISE IOBAN 66X45 STRL (DRAPES) IMPLANT
DRAPE ORTHO SPLIT 77X108 STRL (DRAPES) ×6
DRAPE ORTHO SPLIT 87X125 STRL (DRAPES) IMPLANT
DRAPE SURG ORHT 6 SPLT 77X108 (DRAPES) ×2 IMPLANT
DRSG MEPITEL 4X7.2 (GAUZE/BANDAGES/DRESSINGS) ×2 IMPLANT
DRSG PAD ABDOMINAL 8X10 ST (GAUZE/BANDAGES/DRESSINGS) IMPLANT
DRSG VAC ATS LRG SENSATRAC (GAUZE/BANDAGES/DRESSINGS) IMPLANT
DRSG VAC ATS MED SENSATRAC (GAUZE/BANDAGES/DRESSINGS) IMPLANT
DRSG VAC ATS SM SENSATRAC (GAUZE/BANDAGES/DRESSINGS) IMPLANT
ELECT REM PT RETURN 9FT ADLT (ELECTROSURGICAL) ×3
ELECTRODE REM PT RTRN 9FT ADLT (ELECTROSURGICAL) ×3 IMPLANT
GAUZE SPONGE 4X4 12PLY STRL (GAUZE/BANDAGES/DRESSINGS) ×2 IMPLANT
GAUZE XEROFORM 5X9 LF (GAUZE/BANDAGES/DRESSINGS) ×2 IMPLANT
GLOVE BIO SURGEON STRL SZ 6.5 (GLOVE) ×12 IMPLANT
GLOVE BIO SURGEON STRL SZ7.5 (GLOVE) ×16 IMPLANT
GLOVE BIOGEL PI IND STRL 6.5 (GLOVE) ×3 IMPLANT
GLOVE BIOGEL PI IND STRL 7.5 (GLOVE) ×3 IMPLANT
GLOVE BIOGEL PI INDICATOR 6.5 (GLOVE) ×1
GLOVE BIOGEL PI INDICATOR 7.5 (GLOVE) ×1
GOWN STRL REUS W/ TWL LRG LVL3 (GOWN DISPOSABLE) ×6 IMPLANT
GOWN STRL REUS W/TWL LRG LVL3 (GOWN DISPOSABLE) ×6
KIT BASIN OR (CUSTOM PROCEDURE TRAY) ×4 IMPLANT
KIT TURNOVER KIT B (KITS) ×4 IMPLANT
NS IRRIG 1000ML POUR BTL (IV SOLUTION) ×4 IMPLANT
PACK ORTHO EXTREMITY (CUSTOM PROCEDURE TRAY) ×4 IMPLANT
PAD ABD 8X10 STRL (GAUZE/BANDAGES/DRESSINGS) ×2 IMPLANT
PAD ARMBOARD 7.5X6 YLW CONV (MISCELLANEOUS) ×4 IMPLANT
PADDING CAST COTTON 6X4 STRL (CAST SUPPLIES) ×2 IMPLANT
STOCKINETTE IMPERVIOUS LG (DRAPES) ×2 IMPLANT
SUT ETHILON 2 0 PSLX (SUTURE) ×4 IMPLANT
SUT MON AB 2-0 SH 27 (SUTURE) ×6
SUT MON AB 2-0 SH27 (SUTURE) ×2 IMPLANT
TOWEL GREEN STERILE (TOWEL DISPOSABLE) ×4 IMPLANT
TOWEL GREEN STERILE FF (TOWEL DISPOSABLE) ×4 IMPLANT
TUBE CONNECTING 12X1/4 (SUCTIONS) ×4 IMPLANT
YANKAUER SUCT BULB TIP NO VENT (SUCTIONS) ×4 IMPLANT
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
## 2022-03-29 NOTE — Anesthesia Procedure Notes (Signed)
Procedure Name: Intubation Date/Time: 03/29/2022 10:43 AM  Performed by: Marny Lowenstein, CRNAPre-anesthesia Checklist: Patient identified, Emergency Drugs available, Suction available and Patient being monitored Patient Re-evaluated:Patient Re-evaluated prior to induction Oxygen Delivery Method: Circle system utilized Preoxygenation: Pre-oxygenation with 100% oxygen Induction Type: IV induction, Rapid sequence and Cricoid Pressure applied Laryngoscope Size: Miller and 2 Grade View: Grade I Tube type: Oral Tube size: 7.0 mm Number of attempts: 1 Airway Equipment and Method: Patient positioned with wedge pillow and Stylet Placement Confirmation: ETT inserted through vocal cords under direct vision, positive ETCO2 and breath sounds checked- equal and bilateral Secured at: 22 cm Tube secured with: Tape Dental Injury: Teeth and Oropharynx as per pre-operative assessment  Comments: Pt stated she was nauseous in preop; NPO since 0200. Decision made to intubate.
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
## 2022-03-29 NOTE — Transfer of Care (Signed)
Immediate Anesthesia Transfer of Care Note  Patient: Erica Mack  Procedure(s) Performed: IRRIGATION AND DEBRIDEMENT OF LEFT LEG (Left: Leg Upper)  Patient Location: PACU  Anesthesia Type:General  Level of Consciousness: drowsy and patient cooperative  Airway & Oxygen Therapy: Patient Spontanous Breathing  Post-op Assessment: Report given to RN and Post -op Vital signs reviewed and stable  Post vital signs: Reviewed and stable  Last Vitals:  Vitals Value Taken Time  BP 110/72 03/29/22 1133  Temp    Pulse 86 03/29/22 1137  Resp 22 03/29/22 1137  SpO2 99 % 03/29/22 1137  Vitals shown include unvalidated device data.  Last Pain:  Vitals:   03/29/22 0700  TempSrc: Oral  PainSc:       Patients Stated Pain Goal: 0 (03/29/22 0533)  Complications: No notable events documented.

## 2022-03-29 NOTE — Progress Notes (Signed)
OR nurse call that patient is going for surgery this morning but patient last ate at 2 am because there was no NPO order, so the OR nurse said she was going to call MD and get back to [place], still waiting for the call back with update. We continue to monitor
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
## 2022-03-30 DIAGNOSIS — S92411B Displaced fracture of proximal phalanx of right great toe, initial encounter for open fracture: Secondary | ICD-10-CM | POA: Diagnosis not present

## 2022-03-30 LAB — BASIC METABOLIC PANEL
Anion gap: 12 (ref 5–15)
BUN: <5 mg/dL — ABNORMAL LOW (ref 6–20)
CO2: 21 mmol/L — ABNORMAL LOW (ref 22–32)
Calcium: 9.3 mg/dL (ref 8.9–10.3)
Chloride: 103 mmol/L (ref 98–111)
Creatinine, Ser: 0.8 mg/dL (ref 0.44–1.00)
GFR, Estimated: >60 mL/min (ref >60)
Glucose, Bld: 140 mg/dL — ABNORMAL HIGH (ref 70–99)
Potassium: 4.4 mmol/L (ref 3.5–5.1)
Sodium: 136 mmol/L (ref 135–145)

## 2022-03-30 LAB — CBC
HCT: 42.5 % (ref 36.0–46.0)
Hemoglobin: 13.5 g/dL (ref 12.0–15.0)
MCH: 29.6 pg (ref 26.0–34.0)
MCHC: 31.8 g/dL (ref 30.0–36.0)
MCV: 93.2 fL (ref 80.0–100.0)
Platelets: 275 10*3/uL (ref 150–400)
RBC: 4.56 MIL/uL (ref 3.87–5.11)
RDW: 13.2 % (ref 11.5–15.5)
WBC: 16.2 10*3/uL — ABNORMAL HIGH (ref 4.0–10.5)
nRBC: 0 % (ref 0.0–0.2)

## 2022-03-30 LAB — VITAMIN D 25 HYDROXY (VIT D DEFICIENCY, FRACTURES): Vit D, 25-Hydroxy: 10.68 ng/mL — ABNORMAL LOW (ref 30–100)

## 2022-03-30 MED ORDER — KETOROLAC TROMETHAMINE 15 MG/ML IJ SOLN
15.0000 mg | Freq: Four times a day (QID) | INTRAMUSCULAR | Status: AC
  Administered 2022-03-30 – 2022-03-31 (×5): 15 mg via INTRAVENOUS
  Filled 2022-03-30 (×5): qty 1

## 2022-03-30 MED ORDER — ACETAMINOPHEN 500 MG PO TABS
1000.0000 mg | ORAL_TABLET | Freq: Four times a day (QID) | ORAL | Status: DC
  Administered 2022-03-30 – 2022-04-02 (×12): 1000 mg via ORAL
  Filled 2022-03-30 (×12): qty 2

## 2022-03-30 MED ORDER — GABAPENTIN 100 MG PO CAPS
100.0000 mg | ORAL_CAPSULE | Freq: Three times a day (TID) | ORAL | Status: DC
  Administered 2022-03-30 – 2022-04-02 (×9): 100 mg via ORAL
  Filled 2022-03-30 (×10): qty 1
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
## 2022-03-30 NOTE — Progress Notes (Signed)
PT Cancellation Note  Patient Details Name: BAYLEN DEA MRN: [id number] DOB: Feb 09, 1986   Cancelled Treatment:    Reason Eval/Treat Not Completed: Patient declined, just had pain medication and does not want to get out of bed. Not interested in stair training, states she plans to scoot up the steps.  Will attempt follow-up later this afternoon as time allows.    Berton Mount 03/30/2022, 10:21 AM
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
## 2022-03-31 ENCOUNTER — Other Ambulatory Visit (HOSPITAL_COMMUNITY): Payer: Self-pay

## 2022-03-31 LAB — CBC
HCT: 36.3 % (ref 36.0–46.0)
Hemoglobin: 12.3 g/dL (ref 12.0–15.0)
MCH: 29.5 pg (ref 26.0–34.0)
MCHC: 33.9 g/dL (ref 30.0–36.0)
MCV: 87.1 fL (ref 80.0–100.0)
Platelets: 277 10*3/uL (ref 150–400)
RBC: 4.17 MIL/uL (ref 3.87–5.11)
RDW: 13.2 % (ref 11.5–15.5)
WBC: 13.4 10*3/uL — ABNORMAL HIGH (ref 4.0–10.5)
nRBC: 0 % (ref 0.0–0.2)

## 2022-03-31 MED ORDER — OXYCODONE-ACETAMINOPHEN 5-325 MG PO TABS
1.0000 | ORAL_TABLET | ORAL | 0 refills | Status: DC | PRN
  Filled 2022-03-31: qty 30, 5d supply, fill #0

## 2022-03-31 MED ORDER — GABAPENTIN 100 MG PO CAPS
100.0000 mg | ORAL_CAPSULE | Freq: Three times a day (TID) | ORAL | 0 refills | Status: DC
  Filled 2022-03-31: qty 21, 7d supply, fill #0

## 2022-03-31 MED ORDER — METHOCARBAMOL 500 MG PO TABS
500.0000 mg | ORAL_TABLET | Freq: Four times a day (QID) | ORAL | 0 refills | Status: DC | PRN
Start: 2022-03-31 — End: 2022-04-01
  Filled 2022-03-31: qty 28, 7d supply, fill #0

## 2022-03-31 NOTE — Progress Notes (Addendum)
PT Cancellation Note  Patient Details Name: Erica Mack MRN: [id number] DOB: 1986/05/16   Cancelled Treatment:    Reason Eval/Treat Not Completed: Patient declined, states she is too tired right now, may be willing to work with therapy later. Will attempt to follow-up today as time allows.  Addendum: Followed-up with patient per her request this afternoon. Denies further PT today, states she is heading home shortly. All questions answered.   Berton Mount 03/31/2022, 10:51 AM

## 2022-03-31 NOTE — Progress Notes (Signed)
Noted leaking in left leg dressing, reinforce with compression wrap.
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
## 2022-04-01 MED ORDER — OXYCODONE-ACETAMINOPHEN 5-325 MG PO TABS: 1.0000 | ORAL_TABLET | ORAL | 0 refills | Status: DC | PRN

## 2022-04-01 MED ORDER — METHOCARBAMOL 500 MG PO TABS
500.0000 mg | ORAL_TABLET | Freq: Four times a day (QID) | ORAL | 0 refills | Status: DC | PRN
Start: 2022-04-01 — End: 2022-08-04

## 2022-04-01 MED ORDER — GABAPENTIN 100 MG PO CAPS: 100.0000 mg | ORAL_CAPSULE | Freq: Three times a day (TID) | ORAL | 0 refills | Status: DC

## 2022-04-01 MED ORDER — ONDANSETRON HCL 4 MG PO TABS: 4.0000 mg | ORAL_TABLET | Freq: Three times a day (TID) | ORAL | 0 refills | Status: DC | PRN
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
## 2022-04-01 NOTE — Plan of Care (Signed)

## 2022-04-01 NOTE — Plan of Care (Signed)
  Problem: Nutrition: Goal: Adequate nutrition will be maintained Outcome: Progressing   Problem: Pain Managment: Goal: General experience of comfort will improve Outcome: Progressing   Problem: Safety: Goal: Ability to remain free from injury will improve Outcome: Progressing   
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
## 2022-04-02 NOTE — Plan of Care (Signed)
Vitals stable, pain 8/10, prn med given. Verbalize she is ready to go home today. Problem: Pain Managment: Goal: General experience of comfort will improve Outcome: Progressing   Problem: Coping: Goal: Level of anxiety will decrease Outcome: Progressing

## 2022-04-02 NOTE — Plan of Care (Signed)
  Problem: Pain Managment: Goal: General experience of comfort will improve Outcome: Progressing   Problem: Safety: Goal: Ability to remain free from injury will improve Outcome: Progressing   
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
## 2022-04-03 ENCOUNTER — Telehealth: Payer: Self-pay | Admitting: *Deleted

## 2022-04-03 NOTE — Patient Outreach (Signed)
  Care Coordination [facility] Note Transition Care Management Follow-up Telephone Call Date of discharge and from where: 04/02/22, [facility] How have you been since you were released from the hospital? Having pain, and unsure how to take prescribed pain medications Any questions or concerns? Yes, Patient concerned as to wether she could take her pain medication and muscle relaxer at the same time. Patient unable to schedule Outpatient PT, due to issues with referral. RNCM sent message to Thyra Breed for referral correction.  Items Reviewed: Did the pt receive and understand the discharge instructions provided? Yes  Medications obtained and verified? Yes  Other? Yes , RNCM reviewed medications and answered questions Any new allergies since your discharge? No  Dietary orders reviewed? No Do you have support at home? Yes   Home Care and Equipment/Supplies: Were home health services ordered? no If so, what is the name of the agency? N/A  Has the agency set up a time to come to the patient's home? not applicable Were any new equipment or medical supplies ordered?  Yes: rolling walker What is the name of the medical supply agency?  Were you able to get the supplies/equipment? yes Do you have any questions related to the use of the equipment or supplies? No  Functional Questionnaire: (I = Independent and D = Dependent) ADLs: I  Bathing/Dressing- I  Meal Prep- I  Eating- I  Maintaining continence- I  Transferring/Ambulation- I  Managing Meds- I  Follow up appointments reviewed:  PCP Hospital f/u appt confirmed? Yes  Scheduled to see PCP on 04/12/22 @ 10am. Specialist [facility] f/u appt confirmed? Yes  Scheduled to see Dr. Jena Gauss on 04/18/22 @ 9:30am. Are transportation arrangements needed? No  If their condition worsens, is the pt aware to call PCP or go to the Emergency Dept.? Yes Was the patient provided with contact information for the PCP's office or ED? No Was to pt  encouraged to call back with questions or concerns? Yes  SDOH assessments and interventions completed:   Yes  Care Coordination Interventions Activated:  No Care Coordination Interventions:   Provider collaboration  Encounter Outcome:  Pt. Visit Completed

## 2022-04-04 ENCOUNTER — Ambulatory Visit: Payer: Medicaid Other | Attending: Student | Admitting: Physical Therapy

## 2022-04-04 DIAGNOSIS — M6281 Muscle weakness (generalized): Secondary | ICD-10-CM | POA: Diagnosis present

## 2022-04-04 DIAGNOSIS — R2689 Other abnormalities of gait and mobility: Secondary | ICD-10-CM | POA: Diagnosis not present

## 2022-04-04 DIAGNOSIS — M79671 Pain in right foot: Secondary | ICD-10-CM | POA: Diagnosis present

## 2022-04-04 DIAGNOSIS — W3400XA Accidental discharge from unspecified firearms or gun, initial encounter: Secondary | ICD-10-CM | POA: Diagnosis not present

## 2022-04-04 DIAGNOSIS — M79652 Pain in left thigh: Secondary | ICD-10-CM | POA: Diagnosis present
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
## 2022-04-04 NOTE — Patient Instructions (Signed)
Access Code: [email or web address] URL: [email or web address] Date: 04/04/2022 Prepared by: Rosana Hoes  Exercises - Supine Heel Slide with Strap  - 2 x daily - 1-2 sets - 10 reps - 5 seconds hold - Supine Quad Set on Towel Roll  - 2 x daily - 1-2 sets - 10 reps - 5 seconds hold - Hooklying Straight Leg Raise  - 2 x daily - 1-2 sets - 10 reps - Sidelying Hip Abduction  - 2 x daily - 1-2 sets - 10 reps - Prone Hip Extension  - 2 x daily - 1-2 sets - 10 reps - Prone Knee Flexion  - 2 x daily - 1-2 sets - 10 reps

## 2022-04-05 ENCOUNTER — Encounter: Payer: Self-pay | Admitting: Physical Therapy

## 2022-04-05 ENCOUNTER — Other Ambulatory Visit: Payer: Self-pay

## 2022-04-12 ENCOUNTER — Ambulatory Visit: Payer: Medicaid Other | Admitting: Physical Therapy

## 2022-04-12 DIAGNOSIS — W3400XD Accidental discharge from unspecified firearms or gun, subsequent encounter: Secondary | ICD-10-CM | POA: Diagnosis not present

## 2022-04-12 DIAGNOSIS — S92411D Displaced fracture of proximal phalanx of right great toe, subsequent encounter for fracture with routine healing: Secondary | ICD-10-CM | POA: Diagnosis not present

## 2022-04-12 DIAGNOSIS — S71102D Unspecified open wound, left thigh, subsequent encounter: Secondary | ICD-10-CM | POA: Diagnosis not present

## 2022-04-18 ENCOUNTER — Ambulatory Visit: Payer: Medicaid Other | Admitting: Physical Therapy

## 2022-05-07 ENCOUNTER — Encounter (HOSPITAL_COMMUNITY): Payer: Self-pay

## 2022-05-07 ENCOUNTER — Emergency Department (HOSPITAL_COMMUNITY)
Admission: EM | Admit: 2022-05-07 | Discharge: 2022-05-07 | Discharge status: Against Medical Advice | Payer: Medicaid Other | Attending: Emergency Medicine | Admitting: Emergency Medicine

## 2022-05-07 ENCOUNTER — Other Ambulatory Visit: Payer: Self-pay

## 2022-05-07 DIAGNOSIS — I10 Essential (primary) hypertension: Secondary | ICD-10-CM | POA: Insufficient documentation

## 2022-05-07 DIAGNOSIS — J45909 Unspecified asthma, uncomplicated: Secondary | ICD-10-CM | POA: Insufficient documentation

## 2022-05-07 DIAGNOSIS — K625 Hemorrhage of anus and rectum: Secondary | ICD-10-CM | POA: Diagnosis present

## 2022-05-07 DIAGNOSIS — K921 Melena: Secondary | ICD-10-CM | POA: Diagnosis not present

## 2022-05-07 DIAGNOSIS — R1030 Lower abdominal pain, unspecified: Secondary | ICD-10-CM | POA: Diagnosis not present

## 2022-05-07 LAB — COMPREHENSIVE METABOLIC PANEL
ALT: 12 U/L (ref 0–44)
AST: 13 U/L — ABNORMAL LOW (ref 15–41)
Albumin: 3.7 g/dL (ref 3.5–5.0)
Alkaline Phosphatase: 50 U/L (ref 38–126)
Anion gap: 4 — ABNORMAL LOW (ref 5–15)
BUN: 8 mg/dL (ref 6–20)
CO2: 25 mmol/L (ref 22–32)
Calcium: 8.7 mg/dL — ABNORMAL LOW (ref 8.9–10.3)
Chloride: 108 mmol/L (ref 98–111)
Creatinine, Ser: 0.79 mg/dL (ref 0.44–1.00)
GFR, Estimated: >60 mL/min (ref >60)
Glucose, Bld: 85 mg/dL (ref 70–99)
Potassium: 3.8 mmol/L (ref 3.5–5.1)
Sodium: 137 mmol/L (ref 135–145)
Total Bilirubin: 0.8 mg/dL (ref 0.3–1.2)
Total Protein: 7.3 g/dL (ref 6.5–8.1)

## 2022-05-07 LAB — CBC WITH DIFFERENTIAL/PLATELET
Abs Immature Granulocytes: 0.02 10*3/uL (ref 0.00–0.07)
Basophils Absolute: 0 10*3/uL (ref 0.0–0.1)
Basophils Relative: 0 %
Eosinophils Absolute: 0 10*3/uL (ref 0.0–0.5)
Eosinophils Relative: 0 %
HCT: 37.9 % (ref 36.0–46.0)
Hemoglobin: 12.6 g/dL (ref 12.0–15.0)
Immature Granulocytes: 0 %
Lymphocytes Relative: 25 %
Lymphs Abs: 2.2 10*3/uL (ref 0.7–4.0)
MCH: 29.4 pg (ref 26.0–34.0)
MCHC: 33.2 g/dL (ref 30.0–36.0)
MCV: 88.3 fL (ref 80.0–100.0)
Monocytes Absolute: 0.6 10*3/uL (ref 0.1–1.0)
Monocytes Relative: 7 %
Neutro Abs: 5.9 10*3/uL (ref 1.7–7.7)
Neutrophils Relative %: 68 %
Platelets: 282 10*3/uL (ref 150–400)
RBC: 4.29 MIL/uL (ref 3.87–5.11)
RDW: 13.4 % (ref 11.5–15.5)
WBC: 8.7 10*3/uL (ref 4.0–10.5)
nRBC: 0 % (ref 0.0–0.2)
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
## 2022-08-01 ENCOUNTER — Ambulatory Visit: Payer: Self-pay | Admitting: Student
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
## 2022-08-03 ENCOUNTER — Other Ambulatory Visit: Payer: Self-pay

## 2022-08-03 ENCOUNTER — Encounter (HOSPITAL_COMMUNITY): Payer: Self-pay | Admitting: Student
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
## 2022-08-04 ENCOUNTER — Encounter (HOSPITAL_COMMUNITY)
Admission: RE | Disposition: A | Discharge status: Home / Self Care | Payer: Self-pay | Source: Home / Self Care | Attending: Student

## 2022-08-04 ENCOUNTER — Encounter (HOSPITAL_COMMUNITY): Payer: Self-pay | Admitting: Student

## 2022-08-04 ENCOUNTER — Ambulatory Visit (HOSPITAL_COMMUNITY): Payer: Medicaid Other | Admitting: Anesthesiology

## 2022-08-04 ENCOUNTER — Ambulatory Visit (HOSPITAL_BASED_OUTPATIENT_CLINIC_OR_DEPARTMENT_OTHER): Payer: Medicaid Other | Admitting: Anesthesiology

## 2022-08-04 ENCOUNTER — Observation Stay (HOSPITAL_COMMUNITY)
Admission: RE | Admit: 2022-08-04 | Discharge: 2022-08-05 | Disposition: A | Discharge status: Home / Self Care | Payer: Medicaid Other | Attending: Student | Admitting: Student

## 2022-08-04 ENCOUNTER — Other Ambulatory Visit: Payer: Self-pay

## 2022-08-04 DIAGNOSIS — J45909 Unspecified asthma, uncomplicated: Secondary | ICD-10-CM | POA: Diagnosis not present

## 2022-08-04 DIAGNOSIS — I10 Essential (primary) hypertension: Secondary | ICD-10-CM | POA: Diagnosis not present

## 2022-08-04 DIAGNOSIS — L02416 Cutaneous abscess of left lower limb: Secondary | ICD-10-CM

## 2022-08-04 HISTORY — PX: INCISION AND DRAINAGE ABSCESS: SHX5864

## 2022-08-04 LAB — COMPREHENSIVE METABOLIC PANEL
ALT: 14 U/L (ref 0–44)
AST: 15 U/L (ref 15–41)
Albumin: 3.3 g/dL — ABNORMAL LOW (ref 3.5–5.0)
Alkaline Phosphatase: 45 U/L (ref 38–126)
Anion gap: 7 (ref 5–15)
BUN: 6 mg/dL (ref 6–20)
CO2: 22 mmol/L (ref 22–32)
Calcium: 8.7 mg/dL — ABNORMAL LOW (ref 8.9–10.3)
Chloride: 107 mmol/L (ref 98–111)
Creatinine, Ser: 0.78 mg/dL (ref 0.44–1.00)
GFR, Estimated: >60 mL/min (ref >60)
Glucose, Bld: 104 mg/dL — ABNORMAL HIGH (ref 70–99)
Potassium: 3.5 mmol/L (ref 3.5–5.1)
Sodium: 136 mmol/L (ref 135–145)
Total Bilirubin: 0.6 mg/dL (ref 0.3–1.2)
Total Protein: 6.5 g/dL (ref 6.5–8.1)

## 2022-08-04 LAB — POCT PREGNANCY, URINE: Preg Test, Ur: NEGATIVE

## 2022-08-04 LAB — CBC
HCT: 37 % (ref 36.0–46.0)
Hemoglobin: 12.3 g/dL (ref 12.0–15.0)
MCH: 29 pg (ref 26.0–34.0)
MCHC: 33.2 g/dL (ref 30.0–36.0)
MCV: 87.3 fL (ref 80.0–100.0)
Platelets: 304 10*3/uL (ref 150–400)
RBC: 4.24 MIL/uL (ref 3.87–5.11)
RDW: 13.4 % (ref 11.5–15.5)
WBC: 10.1 10*3/uL (ref 4.0–10.5)
nRBC: 0 % (ref 0.0–0.2)

## 2022-08-04 SURGERY — INCISION AND DRAINAGE, ABSCESS
Anesthesia: General | Laterality: Left

## 2022-08-04 MED ORDER — METOCLOPRAMIDE HCL 5 MG/ML IJ SOLN: 5.0000 mg | Freq: Three times a day (TID) | INTRAMUSCULAR | Status: DC | PRN

## 2022-08-04 MED ORDER — MIDAZOLAM HCL 2 MG/2ML IJ SOLN
INTRAMUSCULAR | Status: AC
  Filled 2022-08-04: qty 2

## 2022-08-04 MED ORDER — DEXAMETHASONE SODIUM PHOSPHATE 10 MG/ML IJ SOLN
INTRAMUSCULAR | Status: DC | PRN
  Administered 2022-08-04: 10 mg via INTRAVENOUS

## 2022-08-04 MED ORDER — HYDROMORPHONE HCL 1 MG/ML IJ SOLN: 0.5000 mg | INTRAMUSCULAR | Status: DC | PRN

## 2022-08-04 MED ORDER — PROPOFOL 10 MG/ML IV BOLUS
INTRAVENOUS | Status: AC
  Filled 2022-08-04: qty 20

## 2022-08-04 MED ORDER — ONDANSETRON HCL 4 MG PO TABS: 4.0000 mg | ORAL_TABLET | Freq: Four times a day (QID) | ORAL | Status: DC | PRN

## 2022-08-04 MED ORDER — POLYETHYLENE GLYCOL 3350 17 G PO PACK: 17.0000 g | PACK | Freq: Every day | ORAL | Status: DC | PRN

## 2022-08-04 MED ORDER — ACETAMINOPHEN 10 MG/ML IV SOLN: 1000.0000 mg | Freq: Once | INTRAVENOUS | Status: DC | PRN

## 2022-08-04 MED ORDER — SODIUM CHLORIDE 0.9 % IR SOLN
Status: DC | PRN
  Administered 2022-08-04: 2000 mL

## 2022-08-04 MED ORDER — ORAL CARE MOUTH RINSE: 15.0000 mL | OROMUCOSAL | Status: DC | PRN

## 2022-08-04 MED ORDER — FENTANYL CITRATE (PF) 100 MCG/2ML IJ SOLN
INTRAMUSCULAR | Status: AC
  Filled 2022-08-04: qty 2

## 2022-08-04 MED ORDER — FENTANYL CITRATE (PF) 250 MCG/5ML IJ SOLN
INTRAMUSCULAR | Status: DC | PRN
  Administered 2022-08-04 (×4): 50 ug via INTRAVENOUS

## 2022-08-04 MED ORDER — SODIUM CHLORIDE 0.9 % IV SOLN: INTRAVENOUS | Status: DC

## 2022-08-04 MED ORDER — MEPERIDINE HCL 25 MG/ML IJ SOLN: 6.2500 mg | INTRAMUSCULAR | Status: DC | PRN

## 2022-08-04 MED ORDER — CHLORHEXIDINE GLUCONATE 0.12 % MT SOLN: 15.0000 mL | Freq: Once | OROMUCOSAL | Status: AC

## 2022-08-04 MED ORDER — OXYCODONE HCL 5 MG PO TABS
ORAL_TABLET | ORAL | Status: AC
  Filled 2022-08-04: qty 1

## 2022-08-04 MED ORDER — VANCOMYCIN HCL 1000 MG IV SOLR
INTRAVENOUS | Status: DC | PRN
  Administered 2022-08-04: 1000 mg via TOPICAL

## 2022-08-04 MED ORDER — OXYCODONE HCL 5 MG PO TABS: 5.0000 mg | ORAL_TABLET | Freq: Three times a day (TID) | ORAL | 0 refills | Status: DC | PRN

## 2022-08-04 MED ORDER — LIDOCAINE 2% (20 MG/ML) 5 ML SYRINGE
INTRAMUSCULAR | Status: DC | PRN
  Administered 2022-08-04: 100 mg via INTRAVENOUS

## 2022-08-04 MED ORDER — FENTANYL CITRATE (PF) 250 MCG/5ML IJ SOLN
INTRAMUSCULAR | Status: AC
  Filled 2022-08-04: qty 5

## 2022-08-04 MED ORDER — ACETAMINOPHEN 160 MG/5ML PO SOLN: 325.0000 mg | ORAL | Status: DC | PRN

## 2022-08-04 MED ORDER — CHLORHEXIDINE GLUCONATE 0.12 % MT SOLN
OROMUCOSAL | Status: AC
  Administered 2022-08-04: 15 mL via OROMUCOSAL
  Filled 2022-08-04: qty 15

## 2022-08-04 MED ORDER — LACTATED RINGERS IV SOLN: INTRAVENOUS | Status: DC

## 2022-08-04 MED ORDER — CLINDAMYCIN HCL 300 MG PO CAPS
600.0000 mg | ORAL_CAPSULE | Freq: Three times a day (TID) | ORAL | Status: DC
  Administered 2022-08-04 – 2022-08-05 (×3): 600 mg via ORAL
  Filled 2022-08-04 (×7): qty 2

## 2022-08-04 MED ORDER — GABAPENTIN 100 MG PO CAPS
100.0000 mg | ORAL_CAPSULE | Freq: Every day | ORAL | Status: DC
  Administered 2022-08-04 – 2022-08-05 (×2): 100 mg via ORAL
  Filled 2022-08-04 (×2): qty 1

## 2022-08-04 MED ORDER — OXYCODONE HCL 5 MG/5ML PO SOLN: 5.0000 mg | Freq: Once | ORAL | Status: AC | PRN

## 2022-08-04 MED ORDER — ORAL CARE MOUTH RINSE: 15.0000 mL | Freq: Once | OROMUCOSAL | Status: AC

## 2022-08-04 MED ORDER — OXYCODONE HCL 5 MG PO TABS
5.0000 mg | ORAL_TABLET | ORAL | Status: DC | PRN
  Administered 2022-08-04 – 2022-08-05 (×2): 5 mg via ORAL
  Filled 2022-08-04 (×2): qty 1

## 2022-08-04 MED ORDER — HYDRALAZINE HCL 10 MG PO TABS: 10.0000 mg | ORAL_TABLET | Freq: Four times a day (QID) | ORAL | Status: DC | PRN

## 2022-08-04 MED ORDER — TOBRAMYCIN SULFATE 1.2 G IJ SOLR
INTRAMUSCULAR | Status: AC
  Filled 2022-08-04: qty 1.2

## 2022-08-04 MED ORDER — BACITRACIN ZINC 500 UNIT/GM EX OINT
TOPICAL_OINTMENT | CUTANEOUS | Status: AC
  Filled 2022-08-04: qty 28.35

## 2022-08-04 MED ORDER — FLUCONAZOLE 100 MG PO TABS: 100.0000 mg | ORAL_TABLET | Freq: Once | ORAL | 0 refills | Status: AC

## 2022-08-04 MED ORDER — ACETAMINOPHEN 325 MG PO TABS
325.0000 mg | ORAL_TABLET | Freq: Four times a day (QID) | ORAL | Status: DC | PRN
  Administered 2022-08-04: 650 mg via ORAL
  Filled 2022-08-04: qty 2

## 2022-08-04 MED ORDER — MIDAZOLAM HCL 2 MG/2ML IJ SOLN
INTRAMUSCULAR | Status: DC | PRN
  Administered 2022-08-04: 2 mg via INTRAVENOUS

## 2022-08-04 MED ORDER — OXYCODONE HCL 5 MG PO TABS
5.0000 mg | ORAL_TABLET | Freq: Once | ORAL | Status: AC | PRN
  Administered 2022-08-04: 5 mg via ORAL

## 2022-08-04 MED ORDER — PROPOFOL 10 MG/ML IV BOLUS
INTRAVENOUS | Status: DC | PRN
  Administered 2022-08-04: 160 mg via INTRAVENOUS

## 2022-08-04 MED ORDER — DIPHENHYDRAMINE HCL 12.5 MG/5ML PO ELIX: 12.5000 mg | ORAL_SOLUTION | ORAL | Status: DC | PRN

## 2022-08-04 MED ORDER — ONDANSETRON HCL 4 MG/2ML IJ SOLN
INTRAMUSCULAR | Status: DC | PRN
  Administered 2022-08-04: 4 mg via INTRAVENOUS

## 2022-08-04 MED ORDER — FENTANYL CITRATE (PF) 100 MCG/2ML IJ SOLN
25.0000 ug | INTRAMUSCULAR | Status: DC | PRN
  Administered 2022-08-04 (×3): 50 ug via INTRAVENOUS

## 2022-08-04 MED ORDER — ACETAMINOPHEN 325 MG PO TABS: 325.0000 mg | ORAL_TABLET | ORAL | Status: DC | PRN

## 2022-08-04 MED ORDER — METHOCARBAMOL 500 MG PO TABS: 500.0000 mg | ORAL_TABLET | Freq: Four times a day (QID) | ORAL | Status: DC | PRN

## 2022-08-04 MED ORDER — ONDANSETRON HCL 4 MG/2ML IJ SOLN: 4.0000 mg | Freq: Once | INTRAMUSCULAR | Status: DC | PRN

## 2022-08-04 MED ORDER — METHOCARBAMOL 500 MG PO TABS: 500.0000 mg | ORAL_TABLET | Freq: Three times a day (TID) | ORAL | 0 refills | Status: DC | PRN

## 2022-08-04 MED ORDER — ONDANSETRON HCL 4 MG/2ML IJ SOLN
4.0000 mg | Freq: Four times a day (QID) | INTRAMUSCULAR | Status: DC | PRN
  Administered 2022-08-04 (×2): 4 mg via INTRAVENOUS
  Filled 2022-08-04 (×2): qty 2

## 2022-08-04 MED ORDER — CLINDAMYCIN HCL 300 MG PO CAPS: 600.0000 mg | ORAL_CAPSULE | Freq: Three times a day (TID) | ORAL | 0 refills | Status: AC

## 2022-08-04 MED ORDER — DEXMEDETOMIDINE HCL IN NACL 80 MCG/20ML IV SOLN
INTRAVENOUS | Status: DC | PRN
  Administered 2022-08-04 (×2): 8 ug via BUCCAL

## 2022-08-04 MED ORDER — DOCUSATE SODIUM 100 MG PO CAPS
100.0000 mg | ORAL_CAPSULE | Freq: Two times a day (BID) | ORAL | Status: DC
  Administered 2022-08-04 – 2022-08-05 (×3): 100 mg via ORAL
  Filled 2022-08-04 (×3): qty 1

## 2022-08-04 MED ORDER — VANCOMYCIN HCL 1000 MG IV SOLR
INTRAVENOUS | Status: AC
  Filled 2022-08-04: qty 20

## 2022-08-04 MED ORDER — 0.9 % SODIUM CHLORIDE (POUR BTL) OPTIME
TOPICAL | Status: DC | PRN
  Administered 2022-08-04: 1000 mL

## 2022-08-04 MED ORDER — METHOCARBAMOL 1000 MG/10ML IJ SOLN: 500.0000 mg | Freq: Four times a day (QID) | INTRAVENOUS | Status: DC | PRN

## 2022-08-04 MED ORDER — METOCLOPRAMIDE HCL 5 MG PO TABS: 5.0000 mg | ORAL_TABLET | Freq: Three times a day (TID) | ORAL | Status: DC | PRN

## 2022-08-04 SURGICAL SUPPLY — 40 items
BAG COUNTER SPONGE SURGICOUNT (BAG) ×2 IMPLANT
BAG SPNG CNTER NS LX DISP (BAG) ×1
BNDG ELASTIC 4X5.8 VLCR STR LF (GAUZE/BANDAGES/DRESSINGS) IMPLANT
BNDG GAUZE DERMACEA FLUFF 4 (GAUZE/BANDAGES/DRESSINGS) IMPLANT
BNDG GZE DERMACEA 4 6PLY (GAUZE/BANDAGES/DRESSINGS)
CANISTER SUCT 3000ML PPV (MISCELLANEOUS) ×2 IMPLANT
CANISTER WOUND CARE 500ML ATS (WOUND CARE) ×2 IMPLANT
COVER SURGICAL LIGHT HANDLE (MISCELLANEOUS) ×2 IMPLANT
DRAPE HALF SHEET 40X57 (DRAPES) IMPLANT
DRAPE INCISE IOBAN 66X45 STRL (DRAPES) IMPLANT
DRAPE ORTHO SPLIT 77X108 STRL (DRAPES)
DRAPE SURG ORHT 6 SPLT 77X108 (DRAPES) IMPLANT
DRESSING MEPILEX FLEX 4X4 (GAUZE/BANDAGES/DRESSINGS) IMPLANT
DRSG MEPILEX FLEX 4X4 (GAUZE/BANDAGES/DRESSINGS) ×1
DRSG VAC ATS LRG SENSATRAC (GAUZE/BANDAGES/DRESSINGS) IMPLANT
DRSG VAC ATS MED SENSATRAC (GAUZE/BANDAGES/DRESSINGS) IMPLANT
DRSG VAC ATS SM SENSATRAC (GAUZE/BANDAGES/DRESSINGS) IMPLANT
ELECT REM PT RETURN 9FT ADLT (ELECTROSURGICAL) ×1
ELECTRODE REM PT RTRN 9FT ADLT (ELECTROSURGICAL) ×2 IMPLANT
GAUZE PAD ABD 8X10 STRL (GAUZE/BANDAGES/DRESSINGS) IMPLANT
GAUZE SPONGE 4X4 12PLY STRL (GAUZE/BANDAGES/DRESSINGS) IMPLANT
GAUZE XEROFORM 5X9 LF (GAUZE/BANDAGES/DRESSINGS) ×2 IMPLANT
GLOVE BIO SURGEON STRL SZ 6.5 (GLOVE) ×6 IMPLANT
GLOVE BIO SURGEON STRL SZ7.5 (GLOVE) ×8 IMPLANT
GLOVE BIOGEL PI IND STRL 6.5 (GLOVE) ×2 IMPLANT
GLOVE BIOGEL PI IND STRL 7.5 (GLOVE) ×2 IMPLANT
GOWN STRL REUS W/ TWL LRG LVL3 (GOWN DISPOSABLE) ×4 IMPLANT
GOWN STRL REUS W/TWL LRG LVL3 (GOWN DISPOSABLE) ×2
KIT BASIN OR (CUSTOM PROCEDURE TRAY) ×2 IMPLANT
KIT TURNOVER KIT B (KITS) ×2 IMPLANT
NS IRRIG 1000ML POUR BTL (IV SOLUTION) ×2 IMPLANT
PACK ORTHO EXTREMITY (CUSTOM PROCEDURE TRAY) ×2 IMPLANT
PAD ARMBOARD 7.5X6 YLW CONV (MISCELLANEOUS) ×2 IMPLANT
PAD CAST 4YDX4 CTTN HI CHSV (CAST SUPPLIES) IMPLANT
PADDING CAST COTTON 4X4 STRL (CAST SUPPLIES) ×1
SUT ETHILON 2 0 FS 18 (SUTURE) IMPLANT
TOWEL GREEN STERILE (TOWEL DISPOSABLE) ×2 IMPLANT
TOWEL GREEN STERILE FF (TOWEL DISPOSABLE) ×2 IMPLANT
TUBE CONNECTING 12X1/4 (SUCTIONS) ×2 IMPLANT
YANKAUER SUCT BULB TIP NO VENT (SUCTIONS) ×2 IMPLANT

## 2022-08-04 NOTE — Progress Notes (Signed)
PT Cancellation Note  Patient Details Name: Erica Mack MRN: [id number] DOB: [birth year]   Cancelled Treatment:    Reason Eval/Treat Not Completed: Other (comment) Attempted around 1400 but pt lethargic and reports wants to rest.  Plan to f/u later this afternoon. Anise Salvo, PT Acute Rehab [facility] [phone]   Rayetta Humphrey 08/04/2022, 2:54 PM
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
## 2022-08-04 NOTE — Anesthesia Postprocedure Evaluation (Signed)
Anesthesia Post Note  Patient: Erica Mack  Procedure(s) Performed: INCISION AND DRAINAGE ABSCESS THIGH (Left)     Patient location during evaluation: PACU Anesthesia Type: General Level of consciousness: sedated Pain management: pain level controlled Vital Signs Assessment: post-procedure vital signs reviewed and stable Respiratory status: spontaneous breathing Cardiovascular status: stable Postop Assessment: no apparent nausea or vomiting Anesthetic complications: no  No notable events documented.  Last Vitals:  Vitals:   08/04/22 0945 08/04/22 1000  BP: 138/82 (!) 141/90  Pulse: 61 64  Resp: 12 (!) 9  Temp:    SpO2: 100% 100%    Last Pain:  Vitals:   08/04/22 1000  TempSrc:   PainSc: 10-Worst pain ever                 Caren Macadam

## 2022-08-04 NOTE — Discharge Instructions (Signed)
[facility] Discharge Instructions   General Discharge Instructions  WEIGHT BEARING STATUS:weightbearing as tolerated  RANGE OF MOTION/ACTIVITY: ok for unrestricted range of motion as tolerated  Wound Care:Leave your dressing in place until Monday 08/07/22. You may remove the dressings at that time and incisions can be left open to air if there is no drainage. If incision continues to have drainage, follow wound care instructions below. Okay to shower if no drainage from incisions.  DVT/PE prophylaxis: None  Diet: as you were eating previously.  Can use over the counter stool softeners and bowel preparations, such as Miralax, to help with bowel movements.  Narcotics can be constipating.  Be sure to drink plenty of fluids  PAIN MEDICATION USE AND EXPECTATIONS  You have likely been given narcotic medications to help control your pain.  After a traumatic event that results in an fracture (broken bone) with or without surgery, it is ok to use narcotic pain medications to help control one's pain.  We understand that everyone responds to pain differently and each individual patient will be evaluated on a regular basis for the continued need for narcotic medications. Ideally, narcotic medication use should last no more than 6-8 weeks (coinciding with fracture healing).   As a patient it is your responsibility as well to monitor narcotic medication use and report the amount and frequency you use these medications when you come to your office visit.   We would also advise that if you are using narcotic medications, you should take a dose prior to therapy to maximize you participation.  IF YOU ARE ON NARCOTIC MEDICATIONS IT IS NOT PERMISSIBLE TO OPERATE A MOTOR VEHICLE (MOTORCYCLE/CAR/TRUCK/MOPED) OR HEAVY MACHINERY DO NOT MIX NARCOTICS WITH OTHER CNS (CENTRAL NERVOUS SYSTEM) DEPRESSANTS SUCH AS ALCOHOL   STOP SMOKING OR USING NICOTINE PRODUCTS!!!!  As discussed nicotine severely  impairs your body's ability to heal surgical and traumatic wounds but also impairs bone healing.  Wounds and bone heal by forming microscopic blood vessels (angiogenesis) and nicotine is a vasoconstrictor (essentially, shrinks blood vessels).  Therefore, if vasoconstriction occurs to these microscopic blood vessels they essentially disappear and are unable to deliver necessary nutrients to the healing tissue.  This is one modifiable factor that you can do to dramatically increase your chances of healing your injury.    (This means no smoking, no nicotine gum, patches, etc)   ICE AND ELEVATE INJURED/OPERATIVE EXTREMITY  Using ice and elevating the injured extremity above your heart can help with swelling and pain control.  Icing in a pulsatile fashion, such as 20 minutes on and 20 minutes off, can be followed.    Do not place ice directly on skin. Make sure there is a barrier between to skin and the ice pack.    Using frozen items such as frozen peas works well as the conform nicely to the are that needs to be iced.  USE AN ACE WRAP OR TED HOSE FOR SWELLING CONTROL  In addition to icing and elevation, Ace wraps or TED hose are used to help limit and resolve swelling.  It is recommended to use Ace wraps or TED hose until you are informed to stop.    When using Ace Wraps start the wrapping distally (farthest away from the body) and wrap proximally (closer to the body)   Example: If you had surgery on your leg or thing and you do not have a splint on, start the ace wrap at the toes and work your way  up to the thigh        If you had surgery on your upper extremity and do not have a splint on, start the ace wrap at your fingers and work your way up to the upper arm  [place]: [phone]   VISIT OUR WEBSITE FOR ADDITIONAL INFORMATION: [email or web address]    Discharge Wound Care Instructions  Do NOT apply any ointments, solutions or lotions to pin sites or  surgical wounds.  These prevent needed drainage and even though solutions like hydrogen peroxide kill bacteria, they also damage cells lining the pin sites that help fight infection.  Applying lotions or ointments can keep the wounds moist and can cause them to breakdown and open up as well. This can increase the risk for infection. When in doubt call the office.  Surgical incisions should be dressed daily.  If any drainage is noted, use one layer of adaptic or Mepitel, then gauze, Kerlix, and an ace wrap. - These dressing supplies should be available at local medical supply stores [facility], [facility], etc) as well as Management consultant ([facility], [facility], [facility], etc)  Once the incision is completely dry and without drainage, it may be left open to air out.  Showering may begin 36-48 hours later.  Cleaning gently with soap and water.
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
## 2022-08-04 NOTE — Progress Notes (Signed)
OT Cancellation Note and Discharge  Patient Details Name: MADELEIN MAHADEO MRN: [id number] DOB: 1986-03-08   Cancelled Treatment:    Reason Eval/Treat Not Completed: OT screened, no needs identified, will sign off. Received chat text from evaluating OT that pt reports no OT needs and declines OT eval.  Ignacia Palma, OTR/L Acute Rehab Services [facility] [phone] Office [phone]    Evette Georges 08/04/2022, 4:36 PM

## 2022-08-04 NOTE — Anesthesia Procedure Notes (Signed)
Procedure Name: LMA Insertion Date/Time: 08/04/2022 7:41 AM  Performed by: Alease Medina, CRNAPre-anesthesia Checklist: Patient identified, Emergency Drugs available, Suction available and Patient being monitored Patient Re-evaluated:Patient Re-evaluated prior to induction Oxygen Delivery Method: Circle system utilized Preoxygenation: Pre-oxygenation with 100% oxygen Induction Type: IV induction Ventilation: Mask ventilation without difficulty LMA: LMA inserted LMA Size: 4.0 Number of attempts: 1 Placement Confirmation: positive ETCO2, breath sounds checked- equal and bilateral and CO2 detector Tube secured with: Tape Dental Injury: Teeth and Oropharynx as per pre-operative assessment
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
## 2022-08-04 NOTE — Interval H&P Note (Signed)
History and Physical Interval Note:  08/04/2022 7:20 AM  Erica Mack  has presented today for surgery, with the diagnosis of Left thigh abscess.  The various methods of treatment have been discussed with the patient and family. After consideration of risks, benefits and other options for treatment, the patient has consented to  Procedure(s): INCISION AND DRAINAGE ABSCESS THIGH (Left) as a surgical intervention.  The patient's history has been reviewed, patient examined, no change in status, stable for surgery.  I have reviewed the patient's chart and labs.  Questions were answered to the patient's satisfaction.     Caryn Bee P Jaze Rodino

## 2022-08-04 NOTE — Transfer of Care (Signed)
Immediate Anesthesia Transfer of Care Note  Patient: Erica Mack  Procedure(s) Performed: INCISION AND DRAINAGE ABSCESS THIGH (Left)  Patient Location: PACU  Anesthesia Type:General  Level of Consciousness: drowsy and patient cooperative  Airway & Oxygen Therapy: Patient Spontanous Breathing and Patient connected to nasal cannula oxygen  Post-op Assessment: Report given to RN, Post -op Vital signs reviewed and stable, and Patient moving all extremities X 4  Post vital signs: Reviewed and stable  Last Vitals:  Vitals Value Taken Time  BP 114/79 08/04/22 0820  Temp    Pulse 83 08/04/22 0821  Resp 16 08/04/22 0821  SpO2 99 % 08/04/22 0821  Vitals shown include unvalidated device data.  Last Pain:  Vitals:   08/04/22 0619  TempSrc:   PainSc: 8       Patients Stated Pain Goal: 2 (08/04/22 2481)  Complications: No notable events documented.

## 2022-08-05 ENCOUNTER — Encounter (HOSPITAL_COMMUNITY): Payer: Self-pay | Admitting: Student

## 2022-08-05 DIAGNOSIS — L02416 Cutaneous abscess of left lower limb: Secondary | ICD-10-CM | POA: Diagnosis not present

## 2022-08-05 LAB — BASIC METABOLIC PANEL
Anion gap: 7 (ref 5–15)
BUN: 6 mg/dL (ref 6–20)
CO2: 23 mmol/L (ref 22–32)
Calcium: 9 mg/dL (ref 8.9–10.3)
Chloride: 106 mmol/L (ref 98–111)
Creatinine, Ser: 0.97 mg/dL (ref 0.44–1.00)
GFR, Estimated: >60 mL/min (ref >60)
Glucose, Bld: 131 mg/dL — ABNORMAL HIGH (ref 70–99)
Potassium: 3.8 mmol/L (ref 3.5–5.1)
Sodium: 136 mmol/L (ref 135–145)

## 2022-08-05 LAB — CBC
HCT: 39.3 % (ref 36.0–46.0)
Hemoglobin: 12.9 g/dL (ref 12.0–15.0)
MCH: 28.7 pg (ref 26.0–34.0)
MCHC: 32.8 g/dL (ref 30.0–36.0)
MCV: 87.3 fL (ref 80.0–100.0)
Platelets: 345 10*3/uL (ref 150–400)
RBC: 4.5 MIL/uL (ref 3.87–5.11)
RDW: 13.2 % (ref 11.5–15.5)
WBC: 15.6 10*3/uL — ABNORMAL HIGH (ref 4.0–10.5)
nRBC: 0 % (ref 0.0–0.2)

## 2022-08-05 NOTE — Progress Notes (Signed)
  Transition of Care [facility]) Screening Note   Patient Details  Name: Erica Mack Date of Birth: 07-01-1986   Transition of Care [facility]) CM/SW Contact:    Bess Kinds, RN Phone Number: [phone] 08/05/2022, 7:32 AM    Transition of Care Department [facility]) has reviewed patient and no TOC needs have been identified at this time. We will continue to monitor patient advancement through interdisciplinary progression rounds. If new patient transition needs arise, please place a TOC consult.

## 2022-08-05 NOTE — Plan of Care (Signed)
  Problem: Education: Goal: Knowledge of General Education information will improve Description: Including pain rating scale, medication(s)/side effects and non-pharmacologic comfort measures Outcome: Progressing   Problem: Health Behavior/Discharge Planning: Goal: Ability to manage health-related needs will improve Outcome: Progressing   Problem: Pain Managment: Goal: General experience of comfort will improve Outcome: Progressing   

## 2022-08-07 NOTE — Discharge Summary (Signed)
Orthopaedic Trauma Service (OTS) Discharge Summary   Patient ID: Erica Mack MRN: [id number] DOB/AGE: 1985/10/13 36 y.o.  Admit date: 08/04/2022 Discharge date: 08/05/2022  Admission Diagnoses: Left thigh abscess  Discharge Diagnoses:  Principal Problem:   Abscess of left thigh   Past Medical History:  Diagnosis Date   Asthma    Chlamydia contact, treated    Gonorrhea    Headache(784.0)    Hypertension    no meds; dx 2008     Procedures Performed: CPT 27301-Incision and drainage left thigh abscess    Discharged Condition: stable  Hospital Course: Patient presented to [facility] on 08/04/2022 for scheduled procedure on left lower extremity.  Was taken to the operating room by Dr. Doreatha Martin for the above procedure, intraoperative cultures were obtained.  She tolerated this procedure well without complications.  Was instructed to be weightbearing as tolerated on the left lower extremity postoperatively.  Was allowed unrestricted range of motion.  Was admitted overnight for pain control and therapies.  Was able to mobilize with both occupational and physical therapy day of surgery as well as postop day #1.  Due to ambulatory status and low risk surgery, no postoperative anticoagulation was necessary. On 08/05/2022, the patient was tolerating diet, working well with therapies, pain well controlled, vital signs stable, dressings clean, dry, intact and felt stable for discharge to home. Patient will follow up as below and knows to call with questions or concerns.     Consults: None  Significant Diagnostic Studies:   Results for orders placed or performed during the hospital encounter of 08/04/22 (from the past 168 hour(s))  CBC per protocol   Collection Time: 08/04/22  6:06 AM  Result Value Ref Range   WBC 10.1 4.0 - 10.5 K/uL   RBC 4.24 3.87 - 5.11 MIL/uL   Hemoglobin 12.3 12.0 - 15.0 g/dL   HCT 37.0 36.0 - 46.0 %   MCV 87.3 80.0 - 100.0 fL   MCH 29.0  26.0 - 34.0 pg   MCHC 33.2 30.0 - 36.0 g/dL   RDW 13.4 11.5 - 15.5 %   Platelets 304 150 - 400 K/uL   nRBC 0.0 0.0 - 0.2 %  Comprehensive metabolic panel per protocol   Collection Time: 08/04/22  6:06 AM  Result Value Ref Range   Sodium 136 135 - 145 mmol/L   Potassium 3.5 3.5 - 5.1 mmol/L   Chloride 107 98 - 111 mmol/L   CO2 22 22 - 32 mmol/L   Glucose, Bld 104 (H) 70 - 99 mg/dL   BUN 6 6 - 20 mg/dL   Creatinine, Ser 0.78 0.44 - 1.00 mg/dL   Calcium 8.7 (L) 8.9 - 10.3 mg/dL   Total Protein 6.5 6.5 - 8.1 g/dL   Albumin 3.3 (L) 3.5 - 5.0 g/dL   AST 15 15 - 41 U/L   ALT 14 0 - 44 U/L   Alkaline Phosphatase 45 38 - 126 U/L   Total Bilirubin 0.6 0.3 - 1.2 mg/dL   GFR, Estimated >60 >60 mL/min   Anion gap 7 5 - 15  Pregnancy, urine POC   Collection Time: 08/04/22  6:41 AM  Result Value Ref Range   Preg Test, Ur NEGATIVE NEGATIVE  Aerobic/Anaerobic Culture w Gram Stain (surgical/deep wound)   Collection Time: 08/04/22  7:53 AM   Specimen: Abscess  Result Value Ref Range   Specimen Description ABSCESS    Special Requests NONE    Gram Stain  FEW WBC PRESENT, PREDOMINANTLY PMN NO ORGANISMS SEEN    Culture      NO GROWTH 2 DAYS NO ANAEROBES ISOLATED; CULTURE IN PROGRESS FOR 5 DAYS Performed at [facility], [address] [place], [place] [address]    Report Status PENDING   Basic metabolic panel   Collection Time: 08/05/22  2:05 AM  Result Value Ref Range   Sodium 136 135 - 145 mmol/L   Potassium 3.8 3.5 - 5.1 mmol/L   Chloride 106 98 - 111 mmol/L   CO2 23 22 - 32 mmol/L   Glucose, Bld 131 (H) 70 - 99 mg/dL   BUN 6 6 - 20 mg/dL   Creatinine, Ser 0.86 0.44 - 1.00 mg/dL   Calcium 9.0 8.9 - 57.8 mg/dL   GFR, Estimated >46 >96 mL/min   Anion gap 7 5 - 15  CBC   Collection Time: 08/05/22  2:05 AM  Result Value Ref Range   WBC 15.6 (H) 4.0 - 10.5 K/uL   RBC 4.50 3.87 - 5.11 MIL/uL   Hemoglobin 12.9 12.0 - 15.0 g/dL   HCT 29.5 28.4 - 13.2 %   MCV 87.3 80.0 -  100.0 fL   MCH 28.7 26.0 - 34.0 pg   MCHC 32.8 30.0 - 36.0 g/dL   RDW 44.0 10.2 - 72.5 %   Platelets 345 150 - 400 K/uL   nRBC 0.0 0.0 - 0.2 %     Treatments: IV hydration, antibiotics: Ancef and clindamycin, analgesia: acetaminophen, Dilaudid, and oxycodone, therapies: PT and OT, and surgery: As above  Discharge Exam: General: No acute distress, alert and oriented.  Pleasant and cooperative  Respiratory: No increased work of breathing at rest Left lower extremity: Dressing clean, dry, intact.  Tolerates gentle hip and knee motion without significant discomfort.  Ankle DF/PF is intact.  Extremity warm, toes well perfused.  Neurovascularly intact.  Disposition: Discharge disposition: 01-Home or Self Care       Discharge Instructions     Call MD / Call 911   Complete by: As directed    If you experience chest pain or shortness of breath, CALL 911 and be transported to the hospital emergency room.  If you develope a fever above 101 F, pus (white drainage) or increased drainage or redness at the wound, or calf pain, call your surgeon's office.   Call MD / Call 911   Complete by: As directed    If you experience chest pain or shortness of breath, CALL 911 and be transported to the hospital emergency room.  If you develope a fever above 101 F, pus (white drainage) or increased drainage or redness at the wound, or calf pain, call your surgeon's office.   Constipation Prevention   Complete by: As directed    Drink plenty of fluids.  Prune juice may be helpful.  You may use a stool softener, such as Colace (over the counter) 100 mg twice a day.  Use MiraLax (over the counter) for constipation as needed.   Constipation Prevention   Complete by: As directed    Drink plenty of fluids.  Prune juice may be helpful.  You may use a stool softener, such as Colace (over the counter) 100 mg twice a day.  Use MiraLax (over the counter) for constipation as needed.   Diet - low sodium heart healthy    Complete by: As directed    Increase activity slowly as tolerated   Complete by: As directed  Increase activity slowly as tolerated   Complete by: As directed    Post-operative opioid taper instructions:   Complete by: As directed    POST-OPERATIVE OPIOID TAPER INSTRUCTIONS: It is important to wean off of your opioid medication as soon as possible. If you do not need pain medication after your surgery it is ok to stop day one. Opioids include: Codeine, Hydrocodone(Norco, Vicodin), Oxycodone(Percocet, oxycontin) and hydromorphone amongst others.  Long term and even short term use of opiods can cause: Increased pain response Dependence Constipation Depression Respiratory depression And more.  Withdrawal symptoms can include Flu like symptoms Nausea, vomiting And more Techniques to manage these symptoms Hydrate well Eat regular healthy meals Stay active Use relaxation techniques(deep breathing, meditating, yoga) Do Not substitute Alcohol to help with tapering If you have been on opioids for less than two weeks and do not have pain than it is ok to stop all together.  Plan to wean off of opioids This plan should start within one week post op of your joint replacement. Maintain the same interval or time between taking each dose and first decrease the dose.  Cut the total daily intake of opioids by one tablet each day Next start to increase the time between doses. The last dose that should be eliminated is the evening dose.      Post-operative opioid taper instructions:   Complete by: As directed    POST-OPERATIVE OPIOID TAPER INSTRUCTIONS: It is important to wean off of your opioid medication as soon as possible. If you do not need pain medication after your surgery it is ok to stop day one. Opioids include: Codeine, Hydrocodone(Norco, Vicodin), Oxycodone(Percocet, oxycontin) and hydromorphone amongst others.  Long term and even short term use of opiods can cause: Increased  pain response Dependence Constipation Depression Respiratory depression And more.  Withdrawal symptoms can include Flu like symptoms Nausea, vomiting And more Techniques to manage these symptoms Hydrate well Eat regular healthy meals Stay active Use relaxation techniques(deep breathing, meditating, yoga) Do Not substitute Alcohol to help with tapering If you have been on opioids for less than two weeks and do not have pain than it is ok to stop all together.  Plan to wean off of opioids This plan should start within one week post op of your joint replacement. Maintain the same interval or time between taking each dose and first decrease the dose.  Cut the total daily intake of opioids by one tablet each day Next start to increase the time between doses. The last dose that should be eliminated is the evening dose.         Allergies as of 08/05/2022       Reactions   Aspirin Itching        Medication List     STOP taking these medications    ciprofloxacin 500 MG tablet Commonly known as: CIPRO   gabapentin 100 MG capsule Commonly known as: NEURONTIN   ondansetron 4 MG tablet Commonly known as: Zofran   oxyCODONE-acetaminophen 5-325 MG tablet Commonly known as: Percocet   triamterene-hydrochlorothiazide 37.5-25 MG capsule Commonly known as: Dyazide       TAKE these medications    clindamycin 300 MG capsule Commonly known as: CLEOCIN Take 2 capsules (600 mg total) by mouth 3 (three) times daily for 14 days.   methocarbamol 500 MG tablet Commonly known as: ROBAXIN Take 1 tablet (500 mg total) by mouth every 8 (eight) hours as needed for muscle spasms. What changed: when to take  this   multivitamin with minerals tablet Take 1 tablet by mouth daily.   OVER THE COUNTER MEDICATION Place 1 drop into both nostrils daily as needed (Allergies. comgestion). Nasal Spray   oxyCODONE 5 MG immediate release tablet Commonly known as: Roxicodone Take 1 tablet  (5 mg total) by mouth every 8 (eight) hours as needed for severe pain.       ASK your doctor about these medications    fluconazole 100 MG tablet Commonly known as: Diflucan Take 1 tablet (100 mg total) by mouth once for 1 dose. After completion of antibiotics Ask about: Should I take this medication?        Follow-up Information     Haddix, Thomasene Lot, MD. Schedule an appointment as soon as possible for a visit in 2 week(s).   Specialty: Orthopedic Surgery Why: for wound check and suture removal Contact information: [place] [address] [phone]                 Discharge Instructions and Plan: Patient will be discharged to home.  No DVT prophylaxis required in this ambulatory patient.  Patient has all the necessary DME for discharge. Patient will follow up with Dr. Doreatha Martin in 2 weeks for wound check and suture removal.   Signed:  Gwinda Passe, PA-C ?[phone]? (phone) 08/07/2022, 8:45 AM  [facility] [place] [address] [phone] Domingo Sep (F)

## 2022-08-13 ENCOUNTER — Emergency Department (HOSPITAL_COMMUNITY)
Admission: EM | Admit: 2022-08-13 | Discharge: 2022-08-13 | Disposition: A | Discharge status: Home / Self Care | Payer: Medicaid Other | Attending: Emergency Medicine | Admitting: Emergency Medicine

## 2022-08-13 ENCOUNTER — Other Ambulatory Visit: Payer: Self-pay

## 2022-08-13 ENCOUNTER — Encounter (HOSPITAL_COMMUNITY): Payer: Self-pay | Admitting: Emergency Medicine

## 2022-08-13 DIAGNOSIS — I10 Essential (primary) hypertension: Secondary | ICD-10-CM | POA: Insufficient documentation

## 2022-08-13 DIAGNOSIS — J45909 Unspecified asthma, uncomplicated: Secondary | ICD-10-CM | POA: Insufficient documentation

## 2022-08-13 DIAGNOSIS — K649 Unspecified hemorrhoids: Secondary | ICD-10-CM | POA: Insufficient documentation

## 2022-08-13 MED ORDER — LIDOCAINE (ANORECTAL) 5 % EX CREA: TOPICAL_CREAM | CUTANEOUS | 0 refills | Status: AC

## 2022-08-13 MED ORDER — HYDROCORTISONE (PERIANAL) 2.5 % EX CREA: 1.0000 | TOPICAL_CREAM | Freq: Two times a day (BID) | CUTANEOUS | 0 refills | Status: AC

## 2022-08-13 NOTE — ED Triage Notes (Signed)
Pt states she think she pushed out a hemorrhoid after having a bowel movement recently.

## 2022-08-13 NOTE — ED Provider Notes (Signed)
[facility]-EMERGENCY DEPT [facility] Emergency Department Provider Note MRN:  [id number]  Arrival date & time: 08/13/22     Chief Complaint   Hemorrhoids   History of Present Illness   Erica Mack is a 36 y.o. year-old female with a history of hypertension presenting to the ED with chief complaint of hemorrhoid.  Pain to the anus for the past few days, thinks maybe she has a hemorrhoid.  Has been straining with bowel movements recently.  No other complaints.  Review of Systems  A thorough review of systems was obtained and all systems are negative except as noted in the HPI and PMH.   Patient's Health History    Past Medical History:  Diagnosis Date   Asthma    Chlamydia contact, treated    Gonorrhea    Headache(784.0)    Hypertension    no meds; dx 2008    Past Surgical History:  Procedure Laterality Date   DILATION AND CURETTAGE OF UTERUS     EYE SURGERY     INCISION AND DRAINAGE ABSCESS Left 08/04/2022   Procedure: INCISION AND DRAINAGE ABSCESS THIGH;  Surgeon: Roby Lofts, MD;  Location: [facility] OR;  Service: Orthopedics;  Laterality: Left;   INDUCED ABORTION     IRRIGATION AND DEBRIDEMENT KNEE Left 03/29/2022   Procedure: IRRIGATION AND DEBRIDEMENT OF LEFT LEG;  Surgeon: Roby Lofts, MD;  Location: [facility] OR;  Service: Orthopedics;  Laterality: Left;    Family History  Problem Relation Age of Onset   Hypertension Mother    Diabetes Brother    Cancer Maternal Grandmother    Hypertension Maternal Grandmother    Hypertension Maternal Aunt    Heart disease Maternal Aunt    Anesthesia problems Neg Hx     Social History   Socioeconomic History   Marital status: Single    Spouse name: Not on file   Number of children: Not on file   Years of education: Not on file   Highest education level: Not on file  Occupational History   Not on file  Tobacco Use   Smoking status: Never   Smokeless tobacco: Never  Vaping Use   Vaping Use: Never used  Substance and  Sexual Activity   Alcohol use: Yes    Comment: occasional alcohol   Drug use: Not Currently    Types: Marijuana    Comment: prior use   Sexual activity: Yes    Birth control/protection: Implant  Other Topics Concern   Not on file  Social History Narrative   Not on file   Social Determinants of Health   Financial Resource Strain: Not on file  Food Insecurity: Not on file  Transportation Needs: Not on file  Physical Activity: Not on file  Stress: Not on file  Social Connections: Not on file  Intimate Partner Violence: Not on file     Physical Exam   Vitals:   08/13/22 0510  BP: 128/82  Pulse: 81  Resp: 20  Temp: 98.5 F (36.9 C)  SpO2: 100%    CONSTITUTIONAL: Well-appearing, NAD NEURO/PSYCH:  Alert and oriented x 3, no focal deficits EYES:  eyes equal and reactive ENT/NECK:  no LAD, no JVD CARDIO: Regular rate, well-perfused, normal S1 and S2 PULM:  CTAB no wheezing or rhonchi GI/GU:  non-distended, non-tender MSK/SPINE:  No gross deformities, no edema SKIN:  no rash, atraumatic   *Additional and/or pertinent findings included in MDM below  Diagnostic and Interventional Summary    EKG Interpretation  Date/Time:    Ventricular Rate:    PR Interval:    QRS Duration:   QT Interval:    QTC Calculation:   R Axis:     Text Interpretation:         Labs Reviewed - No data to display  No orders to display    Medications - No data to display   Procedures  /  Critical Care Procedures  ED Course and Medical Decision Making  Initial Impression and Ddx Mildly tender external hemorrhoid visualized at the 2 o'clock position.  Possibly thrombosed.  Patient had surgery on her left thigh recently, incision seems to be healing well.  Normal vital signs, no other complaints, appropriate for discharge with symptomatic management.  Past medical/surgical history that increases complexity of ED encounter: None  Interpretation of Diagnostics Laboratory and/or  imaging options to aid in the diagnosis/care of the patient were considered.  After careful history and physical examination, it was determined that there was no indication for diagnostics at this time.  Patient Reassessment and Ultimate Disposition/Management     Discharge  Patient management required discussion with the following services or consulting groups:  None  Complexity of Problems Addressed Acute complicated illness or Injury  Additional Data Reviewed and Analyzed Further history obtained from: None  Additional Factors Impacting ED Encounter Risk None  Barth Kirks. Sedonia Small, MD [place] [email or web address]  Final Clinical Impressions(s) / ED Diagnoses     ICD-10-CM   1. Acute hemorrhoid  K64.9       ED Discharge Orders          Ordered    hydrocortisone (ANUSOL-HC) 2.5 % rectal cream  2 times daily        08/13/22 0530    Lidocaine, Anorectal, 5 % CREA        08/13/22 0530             Discharge Instructions Discussed with and Provided to Patient:    Discharge Instructions      You were evaluated in the Emergency Department and after careful evaluation, we did not find any emergent condition requiring admission or further testing in the hospital.  Your exam/testing today is overall reassuring.  Symptoms seem to be due to a hemorrhoid.  Recommend using the creams provided as directed.  Also recommend Tylenol and Motrin for pain.  Please return to the Emergency Department if you experience any worsening of your condition.   Thank you for allowing [place] to be a part of your care.      Maudie Flakes, MD 08/13/22 [phone]

## 2022-08-13 NOTE — Discharge Instructions (Signed)
You were evaluated in the Emergency Department and after careful evaluation, we did not find any emergent condition requiring admission or further testing in the hospital.  Your exam/testing today is overall reassuring.  Symptoms seem to be due to a hemorrhoid.  Recommend using the creams provided as directed.  Also recommend Tylenol and Motrin for pain.  Please return to the Emergency Department if you experience any worsening of your condition.   Thank you for allowing [place] to be a part of your care.

## 2022-08-14 ENCOUNTER — Telehealth: Payer: Self-pay | Admitting: Pharmacist

## 2022-08-14 ENCOUNTER — Telehealth: Payer: Self-pay

## 2022-08-14 ENCOUNTER — Ambulatory Visit (INDEPENDENT_AMBULATORY_CARE_PROVIDER_SITE_OTHER): Payer: Medicaid Other | Admitting: Infectious Disease

## 2022-08-14 ENCOUNTER — Other Ambulatory Visit: Payer: Self-pay

## 2022-08-14 ENCOUNTER — Encounter: Payer: Self-pay | Admitting: Infectious Disease

## 2022-08-14 VITALS — BP 127/79 | HR 83 | Resp 16 | Ht 66.0 in | Wt 215.0 lb

## 2022-08-14 DIAGNOSIS — B379 Candidiasis, unspecified: Secondary | ICD-10-CM | POA: Diagnosis not present

## 2022-08-14 DIAGNOSIS — L02416 Cutaneous abscess of left lower limb: Secondary | ICD-10-CM | POA: Diagnosis not present

## 2022-08-14 DIAGNOSIS — Z7185 Encounter for immunization safety counseling: Secondary | ICD-10-CM

## 2022-08-14 DIAGNOSIS — A318 Other mycobacterial infections: Secondary | ICD-10-CM | POA: Diagnosis not present

## 2022-08-14 DIAGNOSIS — Z6981 Encounter for mental health services for victim of other abuse: Secondary | ICD-10-CM

## 2022-08-14 HISTORY — DX: Other mycobacterial infections: A31.8

## 2022-08-14 MED ORDER — LINEZOLID 600 MG PO TABS: 600.0000 mg | ORAL_TABLET | Freq: Every day | ORAL | 5 refills | Status: DC

## 2022-08-14 MED ORDER — FLUCONAZOLE 100 MG PO TABS: 100.0000 mg | ORAL_TABLET | Freq: Every day | ORAL | 3 refills | Status: DC

## 2022-08-14 NOTE — Telephone Encounter (Signed)
Completed online application for clofazimine with [employer]. Will update Dr. Daiva Eves when approval status has been decided.  Samreet Edenfield L. Abbagayle Zaragoza, PharmD, BCIDP, AAHIVP, CPP Clinical Pharmacist Practitioner Infectious Diseases Clinical Pharmacist [facility] 08/14/2022, 4:45 PM
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
## 2022-08-14 NOTE — Telephone Encounter (Signed)
Spoke with Ms. Overholt in clinic today regarding management of her M. abscessus infection in conjunction with Dr. Daiva Eves. We spoke with her regarding pharmacy services offered in the clinic and advised her to contact the clinic if she has any issues with side effects, questions on how to take the medication, or issues obtaining medication. During the visit the decision was made to start treatment with a 3-drug regimen containing IV amikacin 2,000 mg TIW, PO linezolid 600 mg daily, and PO clofazimine 100 mg daily. She was counseled on potential side effects of the specific medications listed below:   - IV Amikacin: nephrotoxicity, ototoxicity;  she was counseled to stay hydrated and inform us if she has any difficulties hearing  - Linezolid: thrombocytopenia, peripheral neuropathy, optic neuritis; she was counseled to inform us if she has any difficulties with seeing or numbness/tingling of her extremities - Clofazimine: skin discoloration, skin dryness/itchiness, abdominal pain   She was provided with ~3 months supply of clofazimine samples during the clinic visit today. She was asked to call the clinic after she completes ~1.5 bottles of the medication so clofazimine can be reordered for her. She voiced understanding to all counseling.   Recommended laboratory monitoring:  - CMET twice weekly  - Amikacin trough twice weekly  - CBC with differential weekly   Jani Gravel, PharmD PGY-2 Infectious Diseases Resident  08/14/2022 11:57 AM

## 2022-08-14 NOTE — Telephone Encounter (Signed)
PICC Placement ordered by Dr Daiva Eves Patient scheduled by Darletta Moll on 08/21/22 for placement with [facility]. Patient aware of location and to arrive 15 min early.  First Dose: Administer (Amikacin 2000 mg ONCE) in the home.  End Date: TBD estimated date 10/15/22. Spoke to [facility] to verify First Dose in home.  Attached [facility] team and [facility].

## 2022-08-15 ENCOUNTER — Other Ambulatory Visit: Payer: Self-pay

## 2022-08-15 ENCOUNTER — Ambulatory Visit (INDEPENDENT_AMBULATORY_CARE_PROVIDER_SITE_OTHER): Payer: Medicaid Other | Admitting: Infectious Disease

## 2022-08-15 ENCOUNTER — Encounter: Payer: Self-pay | Admitting: Infectious Disease

## 2022-08-15 DIAGNOSIS — L02416 Cutaneous abscess of left lower limb: Secondary | ICD-10-CM | POA: Diagnosis not present

## 2022-08-15 DIAGNOSIS — A318 Other mycobacterial infections: Secondary | ICD-10-CM | POA: Diagnosis not present

## 2022-08-15 DIAGNOSIS — W548XXA Other contact with dog, initial encounter: Secondary | ICD-10-CM

## 2022-08-15 HISTORY — DX: Other contact with dog, initial encounter: W54.8XXA

## 2022-08-15 LAB — MISC LABCORP TEST (SEND OUT): Labcorp test code: 182261

## 2022-08-15 NOTE — Telephone Encounter (Signed)
Patient notified about PICC appt at [facility].

## 2022-08-15 NOTE — Progress Notes (Signed)
Spoke with patient, provided her with PICC placement appointment time and locations. She will be traveling in January for a few days. Advised her to let her home health team know so that dressing change and labs can be coordinated before she leaves town. Since she is on amikacin, she will need twice weekly labs per Dr. Daiva Eves. Notified Jeri Modena, RN with [facility] of patient's travel plans.   Sandie Ano, RN
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
## 2022-08-21 ENCOUNTER — Ambulatory Visit (HOSPITAL_COMMUNITY)
Admission: RE | Admit: 2022-08-21 | Discharge: 2022-08-21 | Disposition: A | Discharge status: Home / Self Care | Payer: Medicaid Other | Source: Ambulatory Visit | Attending: Infectious Disease | Admitting: Infectious Disease

## 2022-08-21 ENCOUNTER — Other Ambulatory Visit: Payer: Self-pay | Admitting: Infectious Disease

## 2022-08-21 DIAGNOSIS — L02416 Cutaneous abscess of left lower limb: Secondary | ICD-10-CM

## 2022-08-21 DIAGNOSIS — Z6981 Encounter for mental health services for victim of other abuse: Secondary | ICD-10-CM

## 2022-08-21 DIAGNOSIS — A318 Other mycobacterial infections: Secondary | ICD-10-CM | POA: Diagnosis present

## 2022-08-21 DIAGNOSIS — B379 Candidiasis, unspecified: Secondary | ICD-10-CM

## 2022-08-21 DIAGNOSIS — Z7185 Encounter for immunization safety counseling: Secondary | ICD-10-CM

## 2022-08-21 MED ORDER — LIDOCAINE HCL 1 % IJ SOLN
INTRAMUSCULAR | Status: AC
  Administered 2022-08-21: 10 mL
  Filled 2022-08-21: qty 20

## 2022-08-21 MED ORDER — HEPARIN SOD (PORK) LOCK FLUSH 100 UNIT/ML IV SOLN
INTRAVENOUS | Status: AC
  Administered 2022-08-21: 250 [IU]
  Filled 2022-08-21: qty 5

## 2022-08-21 NOTE — Procedures (Signed)
PROCEDURE SUMMARY:  Successful placement of single lumen PICC line to right brachial vein. Length 36 cm Tip at lower SVC/RA PICC capped No complications Ready for use  EBL < 5 mL   Kamorie Aldous H Jocelyn Nold PA-C 08/21/2022, 2:42 PM
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
## 2022-08-30 ENCOUNTER — Other Ambulatory Visit: Payer: Self-pay

## 2022-08-30 ENCOUNTER — Encounter: Payer: Self-pay | Admitting: Infectious Disease

## 2022-08-30 ENCOUNTER — Ambulatory Visit (INDEPENDENT_AMBULATORY_CARE_PROVIDER_SITE_OTHER): Payer: Medicaid Other | Admitting: Infectious Disease

## 2022-08-30 VITALS — BP 143/93 | HR 89 | Temp 98.3°F | Wt 211.0 lb

## 2022-08-30 DIAGNOSIS — A318 Other mycobacterial infections: Secondary | ICD-10-CM | POA: Diagnosis not present

## 2022-08-30 DIAGNOSIS — S71102D Unspecified open wound, left thigh, subsequent encounter: Secondary | ICD-10-CM | POA: Diagnosis not present

## 2022-08-30 DIAGNOSIS — L02416 Cutaneous abscess of left lower limb: Secondary | ICD-10-CM | POA: Diagnosis not present

## 2022-08-30 DIAGNOSIS — W3400XA Accidental discharge from unspecified firearms or gun, initial encounter: Secondary | ICD-10-CM | POA: Diagnosis not present

## 2022-09-04 LAB — RAPID GROWER BROTH SUSCEP.
Amikacin: 4
Ciprofloxacin: 4
Clarithromycin: >16
Clofazimine: 0.25
Doxycycline: >8
Linezolid: 8
Moxifloxacin: >4
Tigecycline: 0.5

## 2022-09-04 LAB — MYCOBACTERIA ID BY MALDI

## 2022-09-04 LAB — ORGANISM ID BY MALDI

## 2022-09-06 LAB — AEROBIC/ANAEROBIC CULTURE W GRAM STAIN (SURGICAL/DEEP WOUND)

## 2022-10-12 ENCOUNTER — Ambulatory Visit (INDEPENDENT_AMBULATORY_CARE_PROVIDER_SITE_OTHER): Payer: Medicaid Other | Admitting: Infectious Disease

## 2022-10-12 ENCOUNTER — Other Ambulatory Visit: Payer: Self-pay

## 2022-10-12 ENCOUNTER — Encounter: Payer: Self-pay | Admitting: Infectious Disease

## 2022-10-12 ENCOUNTER — Telehealth: Payer: Self-pay

## 2022-10-12 VITALS — BP 130/84 | HR 91 | Resp 16 | Ht 66.0 in | Wt 211.0 lb

## 2022-10-12 DIAGNOSIS — A318 Other mycobacterial infections: Secondary | ICD-10-CM | POA: Diagnosis present
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
## 2022-10-12 NOTE — Telephone Encounter (Signed)
Dr Tommy Medal accidentally had END DATE 10/16/22- He needs that changed to 11/21/22. OK to pull picc after last dose on 11/21/22. (Patient aware)  Patient request diff HH nurse pull picc than the nurse that she has coming out. I told her to discuss with [facility] by calling them. She requested getting it pulled at [facility] and per [facility] she would need to call the day of to see if that is possible and we can not promise.   Attached [place].

## 2022-10-19 ENCOUNTER — Encounter: Payer: Self-pay | Admitting: Infectious Disease

## 2022-11-16 ENCOUNTER — Telehealth: Payer: Self-pay

## 2022-11-16 ENCOUNTER — Ambulatory Visit: Payer: Medicaid Other | Admitting: Internal Medicine

## 2022-11-16 ENCOUNTER — Other Ambulatory Visit: Payer: Self-pay

## 2022-11-16 ENCOUNTER — Encounter: Payer: Self-pay | Admitting: Internal Medicine

## 2022-11-16 VITALS — BP 127/86 | HR 90 | Temp 99.3°F | Ht 66.0 in | Wt 215.0 lb

## 2022-11-16 DIAGNOSIS — A318 Other mycobacterial infections: Secondary | ICD-10-CM | POA: Diagnosis present

## 2022-11-16 NOTE — Patient Instructions (Signed)
You have a new concerning lesion today  Please take picture twice weekly to make sure no new lesion  We'll extend the intravenous amikacin antibiotics until 12/19/22. Hope by then if no new lesion and the current papule resolves, we can stop that  We'll keep going with the 2 oral antibiotics otherwise until at least 1-2 months of stable healed 'normal' scar tissue is achieved  See either dr Tommy Medal or me again in around 3 weeks

## 2022-11-16 NOTE — Telephone Encounter (Signed)
Dr. Gale Journey extending OPAT additional four weeks from 2/27. New end date of 12/19/22. Updated orders sent to Carolynn Sayers, RN with [facility].   Beryle Flock, RN
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
## 2022-11-21 ENCOUNTER — Telehealth: Payer: Self-pay | Admitting: Pharmacist

## 2022-11-21 ENCOUNTER — Telehealth: Payer: Self-pay

## 2022-11-21 ENCOUNTER — Encounter: Payer: Self-pay | Admitting: Infectious Disease

## 2022-11-21 NOTE — Telephone Encounter (Signed)
She is on IV amikacin so she will continue to need twice weekly labs. It is fine to do Tuesday and Thursday if she calls back. Thanks!

## 2022-11-21 NOTE — Telephone Encounter (Signed)
2 bottles of clofazimine are located in the pharmacy office when patient needs a refill.  Azadeh Hyder L. Cambri Plourde, PharmD, BCIDP, [place], CPP Clinical Pharmacist Practitioner Infectious Diseases [place] [facility] 11/21/2022, 3:48 PM

## 2022-11-21 NOTE — Telephone Encounter (Signed)
Spoke with Jenny Reichmann [phone]), relayed that okay to complete twice weekly labs on Tuesdays and Thursdays per Cassie Kuppelweiser, RPH.   Beryle Flock, RN

## 2022-11-21 NOTE — Telephone Encounter (Signed)
Received voicemail from [place], TEFL teacher. She has been completing patient's lab work on Tuesdays and Thursdays. They received extension orders to have labs done on Tuesdays and Saturdays.   Jenny Reichmann reports that the patient is adamant that she is unavailable on Saturdays. Jenny Reichmann would like to know if labs should continue to be collected on Tuesdays and Thursdays or if second lab draw of the week should be discontinued.   She did not leave a call back number.   Beryle Flock, RN

## 2022-11-24 ENCOUNTER — Encounter: Payer: Self-pay | Admitting: Infectious Disease

## 2022-12-04 ENCOUNTER — Other Ambulatory Visit: Payer: Self-pay

## 2022-12-04 MED ORDER — FLUCONAZOLE 100 MG PO TABS: 100.0000 mg | ORAL_TABLET | Freq: Every day | ORAL | 3 refills | Status: AC

## 2022-12-05 ENCOUNTER — Ambulatory Visit (INDEPENDENT_AMBULATORY_CARE_PROVIDER_SITE_OTHER): Payer: Medicaid Other | Admitting: Infectious Diseases

## 2022-12-05 ENCOUNTER — Other Ambulatory Visit: Payer: Self-pay | Admitting: Infectious Diseases

## 2022-12-05 ENCOUNTER — Telehealth: Payer: Self-pay | Admitting: Pharmacist

## 2022-12-05 ENCOUNTER — Other Ambulatory Visit: Payer: Self-pay

## 2022-12-05 ENCOUNTER — Telehealth: Payer: Self-pay

## 2022-12-05 ENCOUNTER — Encounter: Payer: Self-pay | Admitting: Infectious Diseases

## 2022-12-05 VITALS — BP 131/87 | HR 87 | Temp 97.8°F | Ht 66.0 in | Wt 216.0 lb

## 2022-12-05 DIAGNOSIS — Z452 Encounter for adjustment and management of vascular access device: Secondary | ICD-10-CM

## 2022-12-05 DIAGNOSIS — L02416 Cutaneous abscess of left lower limb: Secondary | ICD-10-CM | POA: Diagnosis not present

## 2022-12-05 DIAGNOSIS — Z79899 Other long term (current) drug therapy: Secondary | ICD-10-CM | POA: Diagnosis not present

## 2022-12-05 DIAGNOSIS — A318 Other mycobacterial infections: Secondary | ICD-10-CM

## 2022-12-05 NOTE — Progress Notes (Signed)
PICC Removal    PICC length & location:  right brachial 36 cm Removed per verbal order from: Dr. West Bali  Blood thinners:  none per chart review Platelet count:  274 ([facility] 11/28/22)  Site assessment: Dressing clean and dry. Extremity warm and dry. No redness, drainage, or swelling present at insertion site.   Pre-removal vital signs:  BP:  130/91 HR:  87 SpO2:  98%   Insertion site positioned below level of heart. No sutures present. Insertion site cleaned with CHG, catheter removed and petroleum dressing applied. Tip intact. Pressure held until hemostasis achieved.    Length of catheter removed:  36 cm    Provided patient with after care instructions and precautions print out (via [facility]). Reviewed this information with patient.   Patient verbalized understanding and agreement, all questions answered. Patient tolerated procedure well and remained in clinic under the care of RN 30 minutes post removal.  Post-observation vital signs:  BP:  131/87 HR:  87 SpO2:  100%  Notified Carolynn Sayers, RN with [facility] and [facility] pharmacy team of removal.  Beryle Flock, RN

## 2022-12-05 NOTE — Telephone Encounter (Signed)
Patient picked up 2 bottles of clofazimine today. Supply should last for ~3 months. Next refill due around the middle/end of June.  Katti Pelle L. Jsean Taussig, PharmD, BCIDP, AAHIVP, CPP Clinical Pharmacist Practitioner Infectious Diseases [place] [facility] 12/05/2022, 11:57 AM

## 2022-12-05 NOTE — Telephone Encounter (Signed)
Patient here in office, she states the fluconazole 100 mg is not always effective for her yeast infection symptoms. She has sometimes been taking 2 tablets for a total of 200 mg.   Advised her to only take as prescribed. She asked that her dose be increased if possible.  Beryle Flock, RN
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
## 2022-12-06 ENCOUNTER — Other Ambulatory Visit: Payer: Self-pay | Admitting: Pharmacist

## 2022-12-06 DIAGNOSIS — B379 Candidiasis, unspecified: Secondary | ICD-10-CM

## 2022-12-06 MED ORDER — FLUCONAZOLE 50 MG PO TABS: 50.0000 mg | ORAL_TABLET | Freq: Every day | ORAL | 0 refills | Status: DC

## 2022-12-06 NOTE — Telephone Encounter (Signed)
Spoke with Erica Mack, notified her that additional 50 mg tablets of fluconazole will be sent in for her to take in addition to the 100 mg tablets for a total dose of 150 mg. Patient verbalized understanding and has no further questions.   Beryle Flock, RN

## 2022-12-06 NOTE — Telephone Encounter (Signed)
Sent script. Thanks, Estill Bamberg

## 2022-12-12 ENCOUNTER — Ambulatory Visit: Payer: Medicaid Other | Admitting: Internal Medicine

## 2022-12-27 ENCOUNTER — Ambulatory Visit: Payer: Medicaid Other | Admitting: Infectious Disease

## 2023-01-22 ENCOUNTER — Encounter: Payer: Self-pay | Admitting: Infectious Disease

## 2023-01-22 ENCOUNTER — Other Ambulatory Visit: Payer: Self-pay

## 2023-01-22 ENCOUNTER — Ambulatory Visit: Payer: Medicaid Other | Admitting: Infectious Disease

## 2023-01-22 VITALS — BP 117/79 | HR 84 | Temp 97.7°F | Wt 215.0 lb

## 2023-01-22 DIAGNOSIS — A318 Other mycobacterial infections: Secondary | ICD-10-CM

## 2023-01-22 DIAGNOSIS — S21332D Puncture wound without foreign body of left front wall of thorax with penetration into thoracic cavity, subsequent encounter: Secondary | ICD-10-CM

## 2023-01-22 DIAGNOSIS — L02416 Cutaneous abscess of left lower limb: Secondary | ICD-10-CM | POA: Diagnosis not present

## 2023-01-22 LAB — CBC WITH DIFFERENTIAL/PLATELET
Basophils Absolute: 33 cells/uL (ref 0–200)
Basophils Relative: 0.4 %
Eosinophils Absolute: 33 cells/uL (ref 15–500)
HCT: 35.6 % (ref 35.0–45.0)
Lymphs Abs: 2183 cells/uL (ref 850–3900)
MCH: 29 pg (ref 27.0–33.0)
MCV: 86.8 fL (ref 80.0–100.0)
Neutrophils Relative %: 65.8 %
Platelets: 324 10*3/uL (ref 140–400)
RBC: 4.1 10*6/uL (ref 3.80–5.10)
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
## 2023-01-23 ENCOUNTER — Ambulatory Visit: Payer: Medicaid Other | Admitting: Infectious Disease

## 2023-01-23 LAB — SEDIMENTATION RATE: Sed Rate: 33 mm/h — ABNORMAL HIGH (ref 0–20)

## 2023-01-23 LAB — C-REACTIVE PROTEIN: CRP: 5.5 mg/L (ref <8.0)

## 2023-01-23 LAB — BASIC METABOLIC PANEL WITH GFR
BUN: 10 mg/dL (ref 7–25)
CO2: 24 mmol/L (ref 20–32)
Calcium: 8.8 mg/dL (ref 8.6–10.2)
Chloride: 107 mmol/L (ref 98–110)
Creat: 0.82 mg/dL (ref 0.50–0.97)
Glucose, Bld: 109 mg/dL — ABNORMAL HIGH (ref 65–99)
Potassium: 4.3 mmol/L (ref 3.5–5.3)
Sodium: 137 mmol/L (ref 135–146)
eGFR: 94 mL/min/{1.73_m2} (ref >=60)

## 2023-01-23 LAB — CBC WITH DIFFERENTIAL/PLATELET
Absolute Monocytes: 589 cells/uL (ref 200–950)
Eosinophils Relative: 0.4 %
Hemoglobin: 11.9 g/dL (ref 11.7–15.5)
MCHC: 33.4 g/dL (ref 32.0–36.0)
MPV: 10.7 fL (ref 7.5–12.5)
Monocytes Relative: 7.1 %
Neutro Abs: 5461 cells/uL (ref 1500–7800)
RDW: 13.6 % (ref 11.0–15.0)
Total Lymphocyte: 26.3 %
WBC: 8.3 10*3/uL (ref 3.8–10.8)

## 2023-01-30 ENCOUNTER — Telehealth: Payer: Self-pay

## 2023-01-30 NOTE — Telephone Encounter (Signed)
-----   Message from Randall Hiss, MD sent at 01/30/2023 11:59 AM EDT ----- Regarding: RE: Antibiotic noncompliance Thanks Caryn Bee that is quite disconcerting that she is doing such a lousy job taking her antibiotics  I will cc our RN staff ans have them ask her to stop antibiotics and keep her followup appt'  We do not need her M abscessus to be MORE R ----- Message ----- From: Roby Lofts, MD Sent: 01/30/2023  11:19 AM EDT To: Randall Hiss, MD Subject: Antibiotic noncompliance                       I just saw her in the office. She informed me she has been noncompliant with her oral medication. She takes it at best every other day, maybe every 3rd day. Clearly not going to be therapeutic. My suggestion to her would be to come off completely and give trial off antibiotics and see how wounds look and if still an issue then get an MRI like you suggested in your note. Let me know what you think. Thanks.  Caryn Bee

## 2023-01-30 NOTE — Telephone Encounter (Signed)
Spoke with Nyalah, advised her Dr. Daiva Eves would like her to stop her antibiotics to prevent developing resistance. Reminded her of follow up in June. Patient verbalized understanding and has no further questions.   Sandie Ano, RN

## 2023-02-26 ENCOUNTER — Other Ambulatory Visit: Payer: Self-pay | Admitting: Pharmacist

## 2023-02-26 ENCOUNTER — Telehealth: Payer: Self-pay | Admitting: Pharmacist

## 2023-02-26 NOTE — Telephone Encounter (Signed)
2 bottles of clofazimine are located in the pharmacy office when patient needs a refill.  Zamir Staples L. Rhona Fusilier, PharmD, BCIDP, AAHIVP, CPP Clinical Pharmacist Practitioner Infectious Diseases Clinical Pharmacist [facility] 02/26/2023, 3:57 PM

## 2023-03-07 ENCOUNTER — Other Ambulatory Visit: Payer: Self-pay

## 2023-03-07 ENCOUNTER — Ambulatory Visit (INDEPENDENT_AMBULATORY_CARE_PROVIDER_SITE_OTHER): Payer: Medicaid Other | Admitting: Internal Medicine

## 2023-03-07 VITALS — BP 127/85 | HR 78 | Temp 98.2°F | Wt 215.0 lb

## 2023-03-07 DIAGNOSIS — A318 Other mycobacterial infections: Secondary | ICD-10-CM | POA: Diagnosis present
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
## 2023-03-07 NOTE — Patient Instructions (Signed)
Please see dr Daiva Eves in 3-4 weeks   Mri is ordered -- please schedule (front desk can assist) in 1-2 weeks   I suspect you'll need antibiotics as this is likely the same infectious process. Will see what mri shows and see if your surgeon needs to debride any tissue, as this has been going on for a long time

## 2023-03-16 ENCOUNTER — Ambulatory Visit (HOSPITAL_COMMUNITY)
Admission: RE | Admit: 2023-03-16 | Discharge: 2023-03-16 | Disposition: A | Discharge status: Home / Self Care | Payer: Medicaid Other | Source: Ambulatory Visit | Attending: Internal Medicine | Admitting: Internal Medicine

## 2023-03-16 DIAGNOSIS — A318 Other mycobacterial infections: Secondary | ICD-10-CM | POA: Diagnosis not present

## 2023-03-16 MED ORDER — GADOBUTROL 1 MMOL/ML IV SOLN
9.5000 mL | Freq: Once | INTRAVENOUS | Status: AC | PRN
  Administered 2023-03-16: 9.5 mL via INTRAVENOUS

## 2023-04-02 ENCOUNTER — Telehealth: Payer: Self-pay

## 2023-04-02 NOTE — Telephone Encounter (Signed)
-----   Message from Raymondo Band, MD sent at 04/02/2023 10:13 AM EDT ----- Please let patient know that the mri, while it doesn't definitively rule out or rule in any infection, she should follow up in a few weeks and reassess the external skin/wound  thanks

## 2023-04-02 NOTE — Telephone Encounter (Signed)
Spoke with Haynes Bast, discussed per Dr. Renold Don that MRI was not definitive. She has follow up with Dr. Daiva Eves tomorrow.   Sandie Ano, RN

## 2023-04-03 ENCOUNTER — Ambulatory Visit (INDEPENDENT_AMBULATORY_CARE_PROVIDER_SITE_OTHER): Payer: Medicaid Other | Admitting: Infectious Disease

## 2023-04-03 ENCOUNTER — Encounter: Payer: Self-pay | Admitting: Infectious Disease

## 2023-04-03 ENCOUNTER — Other Ambulatory Visit: Payer: Self-pay

## 2023-04-03 VITALS — BP 128/84 | HR 98 | Resp 16 | Wt 216.0 lb

## 2023-04-03 DIAGNOSIS — A318 Other mycobacterial infections: Secondary | ICD-10-CM

## 2023-04-03 DIAGNOSIS — W3400XA Accidental discharge from unspecified firearms or gun, initial encounter: Secondary | ICD-10-CM

## 2023-04-03 LAB — CBC WITH DIFFERENTIAL/PLATELET
Basophils Absolute: 23 cells/uL (ref 0–200)
Basophils Relative: 0.3 %
Eosinophils Absolute: 8 cells/uL — ABNORMAL LOW (ref 15–500)
Eosinophils Relative: 0.1 %
HCT: 39 % (ref 35.0–45.0)
MCHC: 32.8 g/dL (ref 32.0–36.0)
Monocytes Relative: 5.2 %
Neutro Abs: 5067 cells/uL (ref 1500–7800)
Neutrophils Relative %: 65.8 %
Platelets: 303 10*3/uL (ref 140–400)
RDW: 13.8 % (ref 11.0–15.0)
Total Lymphocyte: 28.6 %
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
## 2023-04-04 LAB — CBC WITH DIFFERENTIAL/PLATELET
Absolute Monocytes: 400 cells/uL (ref 200–950)
Hemoglobin: 12.8 g/dL (ref 11.7–15.5)
Lymphs Abs: 2202 cells/uL (ref 850–3900)
MCH: 28.9 pg (ref 27.0–33.0)
MCV: 88 fL (ref 80.0–100.0)
MPV: 11.3 fL (ref 7.5–12.5)
RBC: 4.43 10*6/uL (ref 3.80–5.10)
WBC: 7.7 10*3/uL (ref 3.8–10.8)

## 2023-04-04 LAB — COMPLETE METABOLIC PANEL WITH GFR
AG Ratio: 1.3 (calc) (ref 1.0–2.5)
ALT: 11 U/L (ref 6–29)
AST: 10 U/L (ref 10–30)
Albumin: 3.7 g/dL (ref 3.6–5.1)
Alkaline phosphatase (APISO): 53 U/L (ref 31–125)
BUN: 9 mg/dL (ref 7–25)
CO2: 25 mmol/L (ref 20–32)
Calcium: 8.9 mg/dL (ref 8.6–10.2)
Chloride: 106 mmol/L (ref 98–110)
Creat: 0.87 mg/dL (ref 0.50–0.97)
Globulin: 2.9 g/dL (calc) (ref 1.9–3.7)
Glucose, Bld: 96 mg/dL (ref 65–99)
Potassium: 3.9 mmol/L (ref 3.5–5.3)
Sodium: 137 mmol/L (ref 135–146)
Total Bilirubin: 0.4 mg/dL (ref 0.2–1.2)
Total Protein: 6.6 g/dL (ref 6.1–8.1)
eGFR: 88 mL/min/{1.73_m2} (ref >=60)

## 2023-04-04 LAB — SEDIMENTATION RATE: Sed Rate: 22 mm/h — ABNORMAL HIGH (ref 0–20)

## 2023-04-04 LAB — C-REACTIVE PROTEIN: CRP: <3 mg/L (ref <8.0)

## 2023-05-24 ENCOUNTER — Encounter (HOSPITAL_COMMUNITY): Payer: Self-pay

## 2023-05-24 ENCOUNTER — Ambulatory Visit (HOSPITAL_COMMUNITY)
Admission: EM | Admit: 2023-05-24 | Discharge: 2023-05-24 | Disposition: A | Discharge status: Home / Self Care | Payer: Medicaid Other | Attending: Nurse Practitioner | Admitting: Nurse Practitioner

## 2023-05-24 DIAGNOSIS — Z3202 Encounter for pregnancy test, result negative: Secondary | ICD-10-CM | POA: Diagnosis not present

## 2023-05-24 DIAGNOSIS — N898 Other specified noninflammatory disorders of vagina: Secondary | ICD-10-CM | POA: Insufficient documentation

## 2023-05-24 LAB — HIV ANTIBODY (ROUTINE TESTING W REFLEX): HIV Screen 4th Generation wRfx: NONREACTIVE

## 2023-05-24 LAB — POCT URINE PREGNANCY: Preg Test, Ur: NEGATIVE

## 2023-05-24 MED ORDER — METRONIDAZOLE 500 MG PO TABS: 500.0000 mg | ORAL_TABLET | Freq: Two times a day (BID) | ORAL | 0 refills | Status: AC

## 2023-05-24 NOTE — Discharge Instructions (Addendum)
Take the Flagyl as prescribed to treat BV.  We will contact you if the testing comes back positive for anything else from today.  Recommend stop using fragrances or perfumes/soaps in your pelvic region.  Also recommend condom use with every sexual encounter to prevent STI.
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
## 2023-05-24 NOTE — ED Triage Notes (Signed)
Patient here today with c/o vaginal itching and soreness X 2-3 days.

## 2023-05-25 LAB — RPR: RPR Ser Ql: NONREACTIVE

## 2023-05-31 LAB — CERVICOVAGINAL ANCILLARY ONLY
Bacterial Vaginitis (gardnerella): NEGATIVE
Candida Glabrata: NEGATIVE
Candida Vaginitis: NEGATIVE
Chlamydia: NEGATIVE
Comment: NEGATIVE
Comment: NEGATIVE
Comment: NEGATIVE
Comment: NEGATIVE
Comment: NEGATIVE
Comment: NORMAL
Neisseria Gonorrhea: NEGATIVE
Trichomonas: NEGATIVE

## 2023-06-05 ENCOUNTER — Ambulatory Visit: Payer: Medicaid Other | Admitting: Infectious Disease
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
## 2023-06-12 ENCOUNTER — Other Ambulatory Visit: Payer: Self-pay

## 2023-06-12 ENCOUNTER — Ambulatory Visit (INDEPENDENT_AMBULATORY_CARE_PROVIDER_SITE_OTHER): Payer: Medicaid Other | Admitting: Infectious Disease

## 2023-06-12 ENCOUNTER — Encounter: Payer: Self-pay | Admitting: Infectious Disease

## 2023-06-12 VITALS — BP 130/89 | HR 74 | Ht 66.0 in | Wt 220.0 lb

## 2023-06-12 DIAGNOSIS — L02416 Cutaneous abscess of left lower limb: Secondary | ICD-10-CM | POA: Diagnosis not present

## 2023-06-12 DIAGNOSIS — A318 Other mycobacterial infections: Secondary | ICD-10-CM

## 2023-06-12 DIAGNOSIS — Z808 Family history of malignant neoplasm of other organs or systems: Secondary | ICD-10-CM | POA: Diagnosis not present

## 2023-06-12 DIAGNOSIS — M792 Neuralgia and neuritis, unspecified: Secondary | ICD-10-CM

## 2023-06-13 LAB — CBC WITH DIFFERENTIAL/PLATELET
Absolute Monocytes: 488 {cells}/uL (ref 200–950)
Basophils Absolute: 32 {cells}/uL (ref 0–200)
Basophils Relative: 0.4 %
Eosinophils Absolute: 16 {cells}/uL (ref 15–500)
Eosinophils Relative: 0.2 %
HCT: 39.4 % (ref 35.0–45.0)
Hemoglobin: 13 g/dL (ref 11.7–15.5)
Lymphs Abs: 2248 {cells}/uL (ref 850–3900)
MCH: 29.2 pg (ref 27.0–33.0)
MCHC: 33 g/dL (ref 32.0–36.0)
MCV: 88.5 fL (ref 80.0–100.0)
MPV: 11.4 fL (ref 7.5–12.5)
Monocytes Relative: 6.1 %
Neutro Abs: 5216 {cells}/uL (ref 1500–7800)
Neutrophils Relative %: 65.2 %
Platelets: 317 10*3/uL (ref 140–400)
RBC: 4.45 10*6/uL (ref 3.80–5.10)
RDW: 13.4 % (ref 11.0–15.0)
Total Lymphocyte: 28.1 %
WBC: 8 10*3/uL (ref 3.8–10.8)

## 2023-06-13 LAB — SEDIMENTATION RATE: Sed Rate: 22 mm/h — ABNORMAL HIGH (ref 0–20)

## 2023-08-13 ENCOUNTER — Emergency Department (HOSPITAL_BASED_OUTPATIENT_CLINIC_OR_DEPARTMENT_OTHER)
Admission: EM | Admit: 2023-08-13 | Discharge: 2023-08-13 | Disposition: A | Discharge status: Home / Self Care | Payer: Medicaid Other

## 2023-08-13 ENCOUNTER — Other Ambulatory Visit: Payer: Self-pay

## 2023-08-13 DIAGNOSIS — O26891 Other specified pregnancy related conditions, first trimester: Secondary | ICD-10-CM | POA: Diagnosis present

## 2023-08-13 DIAGNOSIS — O209 Hemorrhage in early pregnancy, unspecified: Secondary | ICD-10-CM | POA: Diagnosis not present

## 2023-08-13 DIAGNOSIS — N939 Abnormal uterine and vaginal bleeding, unspecified: Secondary | ICD-10-CM

## 2023-08-13 DIAGNOSIS — Z3A00 Weeks of gestation of pregnancy not specified: Secondary | ICD-10-CM | POA: Diagnosis not present

## 2023-08-13 LAB — URINALYSIS, ROUTINE W REFLEX MICROSCOPIC
Bacteria, UA: NONE SEEN
Bilirubin Urine: NEGATIVE
Glucose, UA: NEGATIVE mg/dL
Ketones, ur: NEGATIVE mg/dL
Leukocytes,Ua: NEGATIVE
Nitrite: NEGATIVE
Specific Gravity, Urine: 1.018 (ref 1.005–1.030)
pH: 7 (ref 5.0–8.0)

## 2023-08-13 LAB — HCG, QUANTITATIVE, PREGNANCY: hCG, Beta Chain, Quant, S: <1 m[IU]/mL (ref <5)

## 2023-08-13 LAB — CBC
HCT: 38.8 % (ref 36.0–46.0)
Hemoglobin: 12.9 g/dL (ref 12.0–15.0)
MCH: 29.3 pg (ref 26.0–34.0)
MCHC: 33.2 g/dL (ref 30.0–36.0)
MCV: 88 fL (ref 80.0–100.0)
Platelets: 313 10*3/uL (ref 150–400)
RBC: 4.41 MIL/uL (ref 3.87–5.11)
RDW: 13.6 % (ref 11.5–15.5)
WBC: 9.4 10*3/uL (ref 4.0–10.5)
nRBC: 0 % (ref 0.0–0.2)

## 2023-08-13 MED ORDER — KETOROLAC TROMETHAMINE 15 MG/ML IJ SOLN
15.0000 mg | Freq: Once | INTRAMUSCULAR | Status: AC
  Administered 2023-08-13: 15 mg via INTRAMUSCULAR
  Filled 2023-08-13: qty 1

## 2023-08-13 NOTE — Discharge Instructions (Signed)
Please follow-up with your OB/GYN.  Return immediately for fevers, chills, worsening abdominal pain, worsening bleeding more than 2 pads per hour for 3 hours, lightheadedness dizziness or any new or worsening symptoms that are concerning to you.

## 2023-08-13 NOTE — ED Triage Notes (Signed)
States took at home pregnancy test last Tuesday and it was positive. Today she had cramping and "passing blood clots".

## 2023-08-13 NOTE — ED Notes (Signed)
Patient requesting 20 minutes to "sleep off her medicine" as she feels it made her tired. Charge RN [employer] aware. Reviewed AVS with patient, patient expressed understanding of directions, denies further questions at this time, states will leave in approx 20 min.

## 2023-08-13 NOTE — ED Provider Notes (Signed)
[place] EMERGENCY DEPARTMENT AT [facility] Provider Note   CSN: [id number] Arrival date & time: 08/13/23  1819     History  Chief Complaint  Patient presents with   Vaginal Bleeding    Erica Mack is a 37 y.o. female.  Is a 37 year old female presenting emergency department for lower abdominal cramping and possible home pregnancy test that was positive.  Last menstrual period October 21.  Took her Nexplanon implant out roughly a week prior.  Took a home pregnancy test this past Tuesday and it appeared to be possibly positive although had faint line.  She states that she has had some lower abdominal cramping.  And vaginal bleeding consistent with her typical period.   Vaginal Bleeding      Home Medications Prior to Admission medications   Medication Sig Start Date End Date Taking? Authorizing Provider  fluconazole (DIFLUCAN) 100 MG tablet Take 1 tablet (100 mg total) by mouth daily. To be taken in case you develop a yeast infection on the antibiotics we are rx 12/04/22   Daiva Eves, Lisette Grinder, MD  hydrocortisone (ANUSOL-HC) 2.5 % rectal cream Place 1 Application rectally 2 (two) times daily. 08/13/22   Sabas Sous, MD  Lidocaine, Anorectal, 5 % CREA Apply to rectal area twice daily as needed for pain. 08/13/22   Sabas Sous, MD  Multiple Vitamins-Minerals (MULTIVITAMIN WITH MINERALS) tablet Take 1 tablet by mouth daily.    [provider]  OVER THE COUNTER MEDICATION Place 1 drop into both nostrils daily as needed (Allergies. comgestion). Nasal Spray    [provider]  albuterol (PROVENTIL HFA;VENTOLIN HFA) 108 (90 Base) MCG/ACT inhaler Inhale 1-2 puffs into the lungs every 6 (six) hours as needed for wheezing or shortness of breath. Patient not taking: Reported on 12/06/2018 11/06/16 08/12/19  Brock Bad, MD  medroxyPROGESTERone (DEPO-PROVERA) 150 MG/ML injection Inject 1 mL (150 mg total) into the muscle every 3 (three) months.  Bring to office for administration. 12/06/18 08/12/19  Brock Bad, MD      Allergies    Aspirin    Review of Systems   Review of Systems  Genitourinary:  Positive for vaginal bleeding.    Physical Exam Updated Vital Signs BP 133/70   Pulse 74   Temp 98.4 F (36.9 C) (Oral)   Resp 18   Ht 5\' 6"  (1.676 m)   Wt 99.8 kg   LMP 07/23/2023   SpO2 100%   BMI 35.51 kg/m  Physical Exam Vitals and nursing note reviewed.  Constitutional:      General: She is not in acute distress.    Appearance: She is not toxic-appearing.  HENT:     Head: Normocephalic.     Nose: Nose normal.     Mouth/Throat:     Mouth: Mucous membranes are moist.  Eyes:     Conjunctiva/sclera: Conjunctivae normal.  Cardiovascular:     Rate and Rhythm: Normal rate and regular rhythm.  Pulmonary:     Effort: Pulmonary effort is normal.     Breath sounds: Normal breath sounds.  Abdominal:     General: Abdomen is flat. There is no distension.     Tenderness: There is no abdominal tenderness. There is no guarding or rebound.  Genitourinary:    Comments: Deferred due to patient prefer ance.  Musculoskeletal:        General: Normal range of motion.  Skin:    General: Skin is warm and dry.  Capillary Refill: Capillary refill takes less than 2 seconds.  Neurological:     Mental Status: She is alert and oriented to person, place, and time.  Psychiatric:        Mood and Affect: Mood normal.        Behavior: Behavior normal.     ED Results / Procedures / Treatments   Labs (all labs ordered are listed, but only abnormal results are displayed) Labs Reviewed  URINALYSIS, ROUTINE W REFLEX MICROSCOPIC - Abnormal; Notable for the following components:      Result Value   Hgb urine dipstick LARGE (*)    Protein, ur TRACE (*)    All other components within normal limits  CBC  HCG, QUANTITATIVE, PREGNANCY    EKG None  Radiology No results found.  Procedures Procedures    Medications  Ordered in ED Medications  ketorolac (TORADOL) 15 MG/ML injection 15 mg (15 mg Intramuscular Given 08/13/23 2210)    ED Course/ Medical Decision Making/ A&P                                 Medical Decision Making This is a 37 year old female presenting emergency department for lower abdominal cramping and bleeding.  She is afebrile nontachycardic hemodynamically stable.  Physical exam with soft benign abdomen.  CBC with stable hemoglobin.  UA negative for UTI.  hCG level negative.  Unlikely to be having miscarriage/ectopic.  Suspect secondary to menstrual cramping due to recent removal of Nexplanon.  Patient stable for discharge.  Follow-up with her OB/GYN.  Amount and/or Complexity of Data Reviewed External Data Reviewed:     Details: Per chart review seen in the emergency department in August and negative for STIs. Labs: ordered.  Risk Prescription drug management.          Final Clinical Impression(s) / ED Diagnoses Final diagnoses:  None    Rx / DC Orders ED Discharge Orders     None         Coral Spikes, DO 08/13/23 2305

## 2023-08-13 NOTE — ED Notes (Signed)
Patient's mother called, per pt OK to talk to mother about her care. Patient's mother inquired about why ultrasound wasn't performed to rule out ectopic pregnancy, informed her pregnancy test was negative, also asked about starting patient on BP medication, informed her patient's BP has been WNL and does not indicate medications are needed but that patient should be evaluated by both obgyn and pcp regarding these concerns.

## 2023-12-19 ENCOUNTER — Emergency Department (HOSPITAL_BASED_OUTPATIENT_CLINIC_OR_DEPARTMENT_OTHER)

## 2023-12-19 ENCOUNTER — Emergency Department (HOSPITAL_BASED_OUTPATIENT_CLINIC_OR_DEPARTMENT_OTHER)
Admission: EM | Admit: 2023-12-19 | Discharge: 2023-12-20 | Disposition: A | Discharge status: Home / Self Care | Attending: Emergency Medicine | Admitting: Emergency Medicine

## 2023-12-19 DIAGNOSIS — R55 Syncope and collapse: Secondary | ICD-10-CM | POA: Diagnosis not present

## 2023-12-19 DIAGNOSIS — Y92094 Garage of other non-institutional residence as the place of occurrence of the external cause: Secondary | ICD-10-CM | POA: Diagnosis not present

## 2023-12-19 DIAGNOSIS — S0101XA Laceration without foreign body of scalp, initial encounter: Secondary | ICD-10-CM | POA: Insufficient documentation

## 2023-12-19 DIAGNOSIS — S0990XA Unspecified injury of head, initial encounter: Secondary | ICD-10-CM | POA: Diagnosis present

## 2023-12-19 DIAGNOSIS — Y93E5 Activity, floor mopping and cleaning: Secondary | ICD-10-CM | POA: Diagnosis not present

## 2023-12-19 DIAGNOSIS — W01198A Fall on same level from slipping, tripping and stumbling with subsequent striking against other object, initial encounter: Secondary | ICD-10-CM | POA: Insufficient documentation

## 2023-12-19 LAB — CBC WITH DIFFERENTIAL/PLATELET
Abs Immature Granulocytes: 0.04 10*3/uL (ref 0.00–0.07)
Basophils Absolute: 0 10*3/uL (ref 0.0–0.1)
Basophils Relative: 0 %
Eosinophils Absolute: 0 10*3/uL (ref 0.0–0.5)
Eosinophils Relative: 0 %
HCT: 37.3 % (ref 36.0–46.0)
Hemoglobin: 12.5 g/dL (ref 12.0–15.0)
Immature Granulocytes: 0 %
Lymphocytes Relative: 7 %
Lymphs Abs: 0.9 10*3/uL (ref 0.7–4.0)
MCH: 29.2 pg (ref 26.0–34.0)
MCHC: 33.5 g/dL (ref 30.0–36.0)
MCV: 87.1 fL (ref 80.0–100.0)
Monocytes Absolute: 0.8 10*3/uL (ref 0.1–1.0)
Monocytes Relative: 6 %
Neutro Abs: 10.7 10*3/uL — ABNORMAL HIGH (ref 1.7–7.7)
Neutrophils Relative %: 87 %
Platelets: 286 10*3/uL (ref 150–400)
RBC: 4.28 MIL/uL (ref 3.87–5.11)
RDW: 13.9 % (ref 11.5–15.5)
WBC: 12.5 10*3/uL — ABNORMAL HIGH (ref 4.0–10.5)
nRBC: 0 % (ref 0.0–0.2)

## 2023-12-19 MED ORDER — ACETAMINOPHEN 500 MG PO TABS
1000.0000 mg | ORAL_TABLET | Freq: Once | ORAL | Status: AC
  Administered 2023-12-19: 1000 mg via ORAL
  Filled 2023-12-19: qty 2

## 2023-12-19 MED ORDER — LIDOCAINE-EPINEPHRINE (PF) 2 %-1:200000 IJ SOLN
INTRAMUSCULAR | Status: AC
  Administered 2023-12-19: 20 mL
  Filled 2023-12-19: qty 20

## 2023-12-19 NOTE — Discharge Instructions (Addendum)
 Your CT scan do not show any internal injury or fracture. Your labs and EKG have significant abnormalities. Your symptoms tonight were likely due to not having eaten throughout the day.   See your doctor or return here for suture removal in 10 days. See your doctor sooner with any sign of infection. Tylenol and your muscle relaxer at home for discomfort.

## 2023-12-19 NOTE — ED Triage Notes (Signed)
 Patient states she became dizzy and fell backwards and hit head over concrete floor. Lac on back of scalp. Bleeding controlled.  States she has been doing a lot of physical activity today and has not ate. C/o head and neck pain.

## 2023-12-19 NOTE — ED Provider Notes (Signed)
 [name] [facility]PARTMENT AT [facility] Provider Note   CSN: [id number] Arrival date & time: 12/19/23  2146     History {Add pertinent medical, surgical, social history, OB history to HPI:1} Chief Complaint  Patient presents with   Erica Mack is a 38 y.o. female.  Patient to ED after near syncopal episode while cleaning in her garage this evening. She felt lightheaded and fell backwards, hitting her head on the cement floor. She did not go all the way out and got up quickly. No vomiting. She feels symptoms were secondary to not having eaten since this morning. No history of diabetes. No chest pain or SOB. She has since had a meal and feels better.   The history is provided by the patient. No language interpreter was used.  Fall       Home Medications Prior to Admission medications   Medication Sig Start Date End Date Taking? Authorizing Provider  fluconazole (DIFLUCAN) 100 MG tablet Take 1 tablet (100 mg total) by mouth daily. To be taken in case you develop a yeast infection on the antibiotics we are rx 12/04/22   Daiva Eves, Lisette Grinder, MD  hydrocortisone (ANUSOL-HC) 2.5 % rectal cream Place 1 Application rectally 2 (two) times daily. 08/13/22   Sabas Sous, MD  Lidocaine, Anorectal, 5 % CREA Apply to rectal area twice daily as needed for pain. 08/13/22   Sabas Sous, MD  Multiple Vitamins-Minerals (MULTIVITAMIN WITH MINERALS) tablet Take 1 tablet by mouth daily.    [provider]  OVER THE COUNTER MEDICATION Place 1 drop into both nostrils daily as needed (Allergies. comgestion). Nasal Spray    [provider]  albuterol (PROVENTIL HFA;VENTOLIN HFA) 108 (90 Base) MCG/ACT inhaler Inhale 1-2 puffs into the lungs every 6 (six) hours as needed for wheezing or shortness of breath. Patient not taking: Reported on 12/06/2018 11/06/16 08/12/19  Brock Bad, MD  medroxyPROGESTERone (DEPO-PROVERA) 150 MG/ML injection Inject 1 mL  (150 mg total) into the muscle every 3 (three) months. Bring to office for administration. 12/06/18 08/12/19  Brock Bad, MD      Allergies    Aspirin    Review of Systems   Review of Systems  Physical Exam Updated Vital Signs BP (!) 160/92 (BP Location: Right Arm)   Pulse 99   Temp 99.3 F (37.4 C) (Oral)   Resp 20   SpO2 99%  Physical Exam Vitals and nursing note reviewed.  Constitutional:      Appearance: She is well-developed.  HENT:     Head: Normocephalic.     Comments: 6 cm partial thickness laceration to occipital right scalp with hematoma.  Neck:     Comments: No midline cervical tenderness.  Cardiovascular:     Rate and Rhythm: Normal rate and regular rhythm.     Heart sounds: No murmur heard. Pulmonary:     Effort: Pulmonary effort is normal.     Breath sounds: Normal breath sounds. No wheezing, rhonchi or rales.  Abdominal:     Palpations: Abdomen is soft.     Tenderness: There is no abdominal tenderness. There is no guarding or rebound.  Musculoskeletal:        General: Normal range of motion.     Cervical back: Normal range of motion and neck supple.  Skin:    General: Skin is warm and dry.  Neurological:     General: No focal deficit present.  Mental Status: She is alert and oriented to person, place, and time.     GCS: GCS eye subscore is 4. GCS verbal subscore is 5. GCS motor subscore is 6.     Cranial Nerves: Cranial nerves 2-12 are intact.     Motor: Motor function is intact.     Coordination: Coordination is intact.     Gait: Gait is intact.     ED Results / Procedures / Treatments   Labs (all labs ordered are listed, but only abnormal results are displayed) Labs Reviewed  BASIC METABOLIC PANEL  CBC WITH DIFFERENTIAL/PLATELET  HCG, SERUM, QUALITATIVE    EKG EKG Interpretation Date/Time:  Wednesday December 19 2023 21:57:33 EDT Ventricular Rate:  100 PR Interval:  155 QRS Duration:  94 QT Interval:  342 QTC  Calculation: 442 R Axis:   69  Text Interpretation: Sinus tachycardia Borderline repolarization abnormality Confirmed by Alvino Blood ([id number]) on 12/19/2023 10:29:12 PM  Radiology CT Head Wo Contrast Result Date: 12/19/2023 CLINICAL DATA:  Fall. Dizziness and fell backwards striking head on concrete floor. Laceration to the back of the scalp. Head and neck pain. EXAM: CT HEAD WITHOUT CONTRAST CT CERVICAL SPINE WITHOUT CONTRAST TECHNIQUE: Multidetector CT imaging of the head and cervical spine was performed following the standard protocol without intravenous contrast. Multiplanar CT image reconstructions of the cervical spine were also generated. RADIATION DOSE REDUCTION: This exam was performed according to the departmental dose-optimization program which includes automated exposure control, adjustment of the mA and/or kV according to patient size and/or use of iterative reconstruction technique. COMPARISON:  None Available. FINDINGS: CT HEAD FINDINGS Brain: No evidence of acute infarction, hemorrhage, hydrocephalus, extra-axial collection or mass lesion/mass effect. Vascular: No hyperdense vessel or unexpected calcification. Skull: Subcutaneous scalp hematoma over the right posterior parietal region. Small soft tissue gas consistent with laceration. No acute depressed skull fractures. No focal bone lesions. Sinuses/Orbits: Paranasal sinuses and mastoid air cells are clear. Other: None. CT CERVICAL SPINE FINDINGS Alignment: Straightening of the usual cervical lordosis. This is likely positional but could indicate muscle spasm. No anterior subluxation of the cervical vertebrae. Normal alignment of the facet joints. Skull base and vertebrae: Skull base appears intact. No acute displaced fractures identified. No vertebral compression deformities. No focal bone lesion or bone destruction. Bone cortex appears intact. Soft tissues and spinal canal: No prevertebral soft tissue swelling. No abnormal paraspinal  soft tissue mass or infiltration. Disc levels: Intervertebral disc space heights are mostly normal with mild endplate osteophyte formation. Upper chest: Lung apices are clear. Other: None. IMPRESSION: 1. No acute intracranial abnormalities. 2. Right parietal subcutaneous scalp hematoma without underlying skull fracture. 3. Nonspecific straightening of the usual cervical lordosis. No acute displaced fractures are identified. 4. Minimal degenerative changes in the cervical spine. Electronically Signed   By: Burman Nieves M.D.   On: 12/19/2023 22:33   CT Cervical Spine Wo Contrast Result Date: 12/19/2023 CLINICAL DATA:  Fall. Dizziness and fell backwards striking head on concrete floor. Laceration to the back of the scalp. Head and neck pain. EXAM: CT HEAD WITHOUT CONTRAST CT CERVICAL SPINE WITHOUT CONTRAST TECHNIQUE: Multidetector CT imaging of the head and cervical spine was performed following the standard protocol without intravenous contrast. Multiplanar CT image reconstructions of the cervical spine were also generated. RADIATION DOSE REDUCTION: This exam was performed according to the departmental dose-optimization program which includes automated exposure control, adjustment of the mA and/or kV according to patient size and/or use of iterative reconstruction technique. COMPARISON:  None  Available. FINDINGS: CT HEAD FINDINGS Brain: No evidence of acute infarction, hemorrhage, hydrocephalus, extra-axial collection or mass lesion/mass effect. Vascular: No hyperdense vessel or unexpected calcification. Skull: Subcutaneous scalp hematoma over the right posterior parietal region. Small soft tissue gas consistent with laceration. No acute depressed skull fractures. No focal bone lesions. Sinuses/Orbits: Paranasal sinuses and mastoid air cells are clear. Other: None. CT CERVICAL SPINE FINDINGS Alignment: Straightening of the usual cervical lordosis. This is likely positional but could indicate muscle spasm. No  anterior subluxation of the cervical vertebrae. Normal alignment of the facet joints. Skull base and vertebrae: Skull base appears intact. No acute displaced fractures identified. No vertebral compression deformities. No focal bone lesion or bone destruction. Bone cortex appears intact. Soft tissues and spinal canal: No prevertebral soft tissue swelling. No abnormal paraspinal soft tissue mass or infiltration. Disc levels: Intervertebral disc space heights are mostly normal with mild endplate osteophyte formation. Upper chest: Lung apices are clear. Other: None. IMPRESSION: 1. No acute intracranial abnormalities. 2. Right parietal subcutaneous scalp hematoma without underlying skull fracture. 3. Nonspecific straightening of the usual cervical lordosis. No acute displaced fractures are identified. 4. Minimal degenerative changes in the cervical spine. Electronically Signed   By: Burman Nieves M.D.   On: 12/19/2023 22:33    Procedures .Laceration Repair  Date/Time: 12/19/2023 11:06 PM  Performed by: Elpidio Anis, PA-C Authorized by: Elpidio Anis, PA-C   Consent:    Consent obtained:  Verbal   Risks discussed:  Infection Universal protocol:    Procedure explained and questions answered to patient or proxy's satisfaction: yes     Patient identity confirmed:  Verbally with patient Anesthesia:    Anesthesia method:  Local infiltration   Local anesthetic:  Lidocaine 2% WITH epi Laceration details:    Location:  Scalp   Scalp location:  Occipital   Length (cm):  6 Exploration:    Contaminated: no   Treatment:    Area cleansed with:  Saline   Amount of cleaning:  Standard Skin repair:    Repair method:  Sutures   Suture size:  3-0   Suture material:  Prolene   Suture technique:  Simple interrupted   Number of sutures:  5 Approximation:    Approximation:  Close Repair type:    Repair type:  Simple Post-procedure details:    Procedure completion:  Tolerated well, no immediate  complications   {Document cardiac monitor, telemetry assessment procedure when appropriate:1}  Medications Ordered in ED Medications  lidocaine-EPINEPHrine (XYLOCAINE W/EPI) 2 %-1:200000 (PF) injection (has no administration in time range)  acetaminophen (TYLENOL) tablet 1,000 mg (has no administration in time range)    ED Course/ Medical Decision Making/ A&P Clinical Course as of 12/19/23 2308  Wed Dec 19, 2023  2307 Patient with near syncopal episode, fall with head injury and scalp laceration on cement floor. No nausea. Feels better than previously. No history of similar symptoms. Laceration repaired as per procedure note.  [SU]    Clinical Course User Index [SU] Elpidio Anis, PA-C   {   Click here for ABCD2, HEART and other calculatorsREFRESH Note before signing :1}                              Medical Decision Making Amount and/or Complexity of Data Reviewed Labs: ordered. Radiology: ordered.  Risk OTC drugs.   ***  {Document critical care time when appropriate:1} {Document review of labs and clinical decision tools ie  heart score, Chads2Vasc2 etc:1}  {Document your independent review of radiology images, and any outside records:1} {Document your discussion with family members, caretakers, and with consultants:1} {Document social determinants of health affecting pt's care:1} {Document your decision making why or why not admission, treatments were needed:1} Final Clinical Impression(s) / ED Diagnoses Final diagnoses:  None    Rx / DC Orders ED Discharge Orders     None

## 2023-12-20 LAB — BASIC METABOLIC PANEL WITH GFR
Anion gap: 5 (ref 5–15)
BUN: 10 mg/dL (ref 6–20)
CO2: 26 mmol/L (ref 22–32)
Calcium: 8.5 mg/dL — ABNORMAL LOW (ref 8.9–10.3)
Chloride: 103 mmol/L (ref 98–111)
Creatinine, Ser: 0.97 mg/dL (ref 0.44–1.00)
GFR, Estimated: >60 mL/min (ref >60)
Glucose, Bld: 129 mg/dL — ABNORMAL HIGH (ref 70–99)
Potassium: 3.4 mmol/L — ABNORMAL LOW (ref 3.5–5.1)
Sodium: 134 mmol/L — ABNORMAL LOW (ref 135–145)

## 2023-12-20 LAB — HCG, SERUM, QUALITATIVE: Preg, Serum: NEGATIVE

## 2023-12-20 NOTE — ED Notes (Signed)
 Discharge instructions reviewed in hallway.   Opportunity for questions and concerns provided.   Alert, oriented and ambulatory.   Displays no signs of distress.   Encouraged to follow up with PCP/ UC for suture removal in 10 days.   Declined discharge vitals.

## 2024-01-07 ENCOUNTER — Emergency Department (HOSPITAL_BASED_OUTPATIENT_CLINIC_OR_DEPARTMENT_OTHER)
Admission: EM | Admit: 2024-01-07 | Discharge: 2024-01-07 | Disposition: A | Discharge status: Home / Self Care | Attending: Emergency Medicine | Admitting: Emergency Medicine

## 2024-01-07 ENCOUNTER — Other Ambulatory Visit: Payer: Self-pay

## 2024-01-07 ENCOUNTER — Encounter (HOSPITAL_BASED_OUTPATIENT_CLINIC_OR_DEPARTMENT_OTHER): Payer: Self-pay | Admitting: Emergency Medicine

## 2024-01-07 DIAGNOSIS — Z4802 Encounter for removal of sutures: Secondary | ICD-10-CM | POA: Diagnosis present

## 2024-01-07 NOTE — ED Provider Notes (Signed)
 [place] EMERGENCY DEPARTMENT AT [facility] Provider Note   CSN: [id number] Arrival date & time: 01/07/24  1254     History  Chief Complaint  Patient presents with   Suture / Staple Removal    Erica Mack is a 38 y.o. female who presents emergency department for suture removal.  Patient was seen on 3/26 and had 5 sutures placed in the scalp after a fall.  States wound has been healing well, but still does have a little bit of pain, and area of laceration has been itching.   Suture / Staple Removal       Home Medications Prior to Admission medications   Medication Sig Start Date End Date Taking? Authorizing Provider  fluconazole (DIFLUCAN) 100 MG tablet Take 1 tablet (100 mg total) by mouth daily. To be taken in case you develop a yeast infection on the antibiotics we are rx 12/04/22   Daiva Eves, Lisette Grinder, MD  hydrocortisone (ANUSOL-HC) 2.5 % rectal cream Place 1 Application rectally 2 (two) times daily. 08/13/22   Sabas Sous, MD  Lidocaine, Anorectal, 5 % CREA Apply to rectal area twice daily as needed for pain. 08/13/22   Sabas Sous, MD  Multiple Vitamins-Minerals (MULTIVITAMIN WITH MINERALS) tablet Take 1 tablet by mouth daily.    [provider]  OVER THE COUNTER MEDICATION Place 1 drop into both nostrils daily as needed (Allergies. comgestion). Nasal Spray    [provider]  albuterol (PROVENTIL HFA;VENTOLIN HFA) 108 (90 Base) MCG/ACT inhaler Inhale 1-2 puffs into the lungs every 6 (six) hours as needed for wheezing or shortness of breath. Patient not taking: Reported on 12/06/2018 11/06/16 08/12/19  Brock Bad, MD  medroxyPROGESTERone (DEPO-PROVERA) 150 MG/ML injection Inject 1 mL (150 mg total) into the muscle every 3 (three) months. Bring to office for administration. 12/06/18 08/12/19  Brock Bad, MD      Allergies    Aspirin    Review of Systems   Review of Systems  Skin:  Positive for wound.  All other  systems reviewed and are negative.   Physical Exam Updated Vital Signs BP (!) 143/86 (BP Location: Left Arm)   Pulse 82   Temp 97.9 F (36.6 C)   Resp 20   SpO2 97%  Physical Exam Vitals and nursing note reviewed.  Constitutional:      Appearance: Normal appearance.  HENT:     Head: Normocephalic.     Comments: Well-healed laceration to the scalp on the crown, 4 sutures in place, 5th suture no longer in the skin but stuck in the hair. Small hematoma, no infection Eyes:     Conjunctiva/sclera: Conjunctivae normal.  Pulmonary:     Effort: Pulmonary effort is normal. No respiratory distress.  Skin:    General: Skin is warm and dry.  Neurological:     Mental Status: She is alert.  Psychiatric:        Mood and Affect: Mood normal.        Behavior: Behavior normal.     ED Results / Procedures / Treatments   Labs (all labs ordered are listed, but only abnormal results are displayed) Labs Reviewed - No data to display  EKG None  Radiology No results found.  Procedures Procedures    Medications Ordered in ED Medications - No data to display  ED Course/ Medical Decision Making/ A&P  Medical Decision Making  Patient is a 38 y.o. female who presents to the emergency department for suture removal.  Reviewed ED visit note from 3/26, had 5 sutures placed in the scalp.   On exam, patient has a partially healed laceration to the crown of the head with 4 suture(s) in place. 5th suture no longer in the skin but stuck in the hair.   Suture(s) removed and patient tolerated procedure well. Discharged in stable condition. Given instructions for wound care.   Final Clinical Impression(s) / ED Diagnoses Final diagnoses:  Visit for suture removal    Rx / DC Orders ED Discharge Orders     None      Portions of this report may have been transcribed using voice recognition software. Every effort was made to ensure accuracy; however,  inadvertent computerized transcription errors may be present.    Erica Rayas, PA-C 01/07/24 1320    Erica Horse T, DO 01/10/24 [phone]

## 2024-01-07 NOTE — ED Triage Notes (Signed)
 Stitch removal. 5 stitches in scalp.

## 2024-01-07 NOTE — ED Notes (Signed)
 Provider in triage to remove stitches.

## 2024-01-07 NOTE — Discharge Instructions (Signed)
 You were seen in the emergency department for suture/staple removal.  Your wound appears to be healing well. Please keep it clean and appropriately dressed. I would continue using vaseline over the area to keep the skin well hydrated.

## 2024-01-10 ENCOUNTER — Other Ambulatory Visit: Payer: Self-pay | Admitting: Infectious Disease

## 2024-01-11 ENCOUNTER — Other Ambulatory Visit: Payer: Self-pay | Admitting: Pharmacist

## 2024-01-11 DIAGNOSIS — B379 Candidiasis, unspecified: Secondary | ICD-10-CM

## 2024-08-17 ENCOUNTER — Emergency Department (HOSPITAL_BASED_OUTPATIENT_CLINIC_OR_DEPARTMENT_OTHER)
Admission: EM | Admit: 2024-08-17 | Discharge: 2024-08-17 | Disposition: A | Discharge status: Home / Self Care | Attending: Emergency Medicine | Admitting: Emergency Medicine

## 2024-08-17 ENCOUNTER — Other Ambulatory Visit: Payer: Self-pay

## 2024-08-17 DIAGNOSIS — I10 Essential (primary) hypertension: Secondary | ICD-10-CM | POA: Diagnosis not present

## 2024-08-17 DIAGNOSIS — M542 Cervicalgia: Secondary | ICD-10-CM | POA: Diagnosis present

## 2024-08-17 DIAGNOSIS — Z79899 Other long term (current) drug therapy: Secondary | ICD-10-CM | POA: Diagnosis not present

## 2024-08-17 DIAGNOSIS — G589 Mononeuropathy, unspecified: Secondary | ICD-10-CM | POA: Insufficient documentation

## 2024-08-17 MED ORDER — DEXAMETHASONE SOD PHOSPHATE PF 10 MG/ML IJ SOLN
10.0000 mg | Freq: Once | INTRAMUSCULAR | Status: AC
  Administered 2024-08-17: 10 mg via INTRAMUSCULAR
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
## 2024-08-17 NOTE — ED Notes (Signed)
 Pt discharged home after verbalizing understanding of discharge instructions; nad noted.

## 2024-08-17 NOTE — Discharge Instructions (Signed)
 It was a pleasure taking care of you today. You were seen in the Emergency Department for left neck and shoulder pain. Your work-up was reassuring. Your physical exam is consistent with a pinched nerve.  As we discussed, I recommend you follow-up with your PCP to schedule an outpatient MRI and obtain a referral for physical therapy.  While in the emergency department, I did give you a shot of Decadron , which is a steroid.  This should help to treat some of the inflammation until you are able to see your PCP. Refer to the attached documentation for further management of your symptoms.   Please return to the ER if you experience chest pain, trouble breathing, intractable nausea/vomiting or any other life threatening illnesses.

## 2024-08-17 NOTE — ED Triage Notes (Signed)
 Pt caox4 ambulatory c/o L neck and shoulder pain further reporting her whole left side has been hurting from her neck to her feet since having surgery in August but got worse over the past couple days.
